# Patient Record
Sex: Male | Born: 1940
Health system: Southern US, Community
[De-identification: ages and names within clinical notes are randomized; demographics above are authoritative.]

## PROBLEM LIST (undated history)

## (undated) DIAGNOSIS — S8290XA Unspecified fracture of unspecified lower leg, initial encounter for closed fracture: Secondary | ICD-10-CM

## (undated) DIAGNOSIS — I639 Cerebral infarction, unspecified: Secondary | ICD-10-CM

## (undated) DIAGNOSIS — K219 Gastro-esophageal reflux disease without esophagitis: Secondary | ICD-10-CM

---

## 2001-02-09 ENCOUNTER — Encounter: Payer: Self-pay | Admitting: *Deleted

## 2001-02-09 ENCOUNTER — Ambulatory Visit (HOSPITAL_COMMUNITY): Admission: RE | Admit: 2001-02-09 | Discharge: 2001-02-09 | Payer: Self-pay | Admitting: *Deleted

## 2002-04-11 ENCOUNTER — Encounter: Payer: Self-pay | Admitting: Family Medicine

## 2002-04-11 ENCOUNTER — Ambulatory Visit (HOSPITAL_COMMUNITY): Admission: RE | Admit: 2002-04-11 | Discharge: 2002-04-11 | Payer: Self-pay | Admitting: Family Medicine

## 2006-05-22 ENCOUNTER — Emergency Department (HOSPITAL_COMMUNITY): Admission: EM | Admit: 2006-05-22 | Discharge: 2006-05-22 | Payer: Self-pay | Admitting: Emergency Medicine

## 2009-03-12 ENCOUNTER — Emergency Department (HOSPITAL_COMMUNITY): Admission: EM | Admit: 2009-03-12 | Discharge: 2009-03-12 | Payer: Self-pay | Admitting: Emergency Medicine

## 2010-01-01 ENCOUNTER — Emergency Department (HOSPITAL_COMMUNITY): Admission: EM | Admit: 2010-01-01 | Discharge: 2010-01-01 | Payer: Self-pay | Admitting: Emergency Medicine

## 2010-07-09 LAB — URINE MICROSCOPIC-ADD ON

## 2010-07-09 LAB — URINALYSIS, ROUTINE W REFLEX MICROSCOPIC
Bilirubin Urine: NEGATIVE
Glucose, UA: NEGATIVE mg/dL
Ketones, ur: 15 mg/dL — AB
Nitrite: NEGATIVE
Protein, ur: NEGATIVE mg/dL
Specific Gravity, Urine: 1.021 (ref 1.005–1.030)
Urobilinogen, UA: 0.2 mg/dL (ref 0.0–1.0)
pH: 5 (ref 5.0–8.0)

## 2010-07-09 LAB — URINE CULTURE
Colony Count: >=100000
Culture  Setup Time: 201109082119

## 2010-07-29 LAB — POCT I-STAT, CHEM 8
Calcium, Ion: 1.1 mmol/L — ABNORMAL LOW (ref 1.12–1.32)
Creatinine, Ser: 0.7 mg/dL (ref 0.4–1.5)
Hemoglobin: 15.3 g/dL (ref 13.0–17.0)
Sodium: 138 mEq/L (ref 135–145)
TCO2: 28 mmol/L (ref 0–100)

## 2011-04-18 ENCOUNTER — Encounter: Payer: Self-pay | Admitting: Emergency Medicine

## 2011-04-18 ENCOUNTER — Emergency Department (HOSPITAL_COMMUNITY): Payer: Medicare Other

## 2011-04-18 ENCOUNTER — Emergency Department (HOSPITAL_COMMUNITY)
Admission: EM | Admit: 2011-04-18 | Discharge: 2011-04-19 | Disposition: A | Discharge status: Home / Self Care | Payer: Medicare Other | Attending: Emergency Medicine | Admitting: Emergency Medicine

## 2011-04-18 DIAGNOSIS — R109 Unspecified abdominal pain: Secondary | ICD-10-CM | POA: Insufficient documentation

## 2011-04-18 DIAGNOSIS — M545 Low back pain, unspecified: Secondary | ICD-10-CM | POA: Insufficient documentation

## 2011-04-18 DIAGNOSIS — M533 Sacrococcygeal disorders, not elsewhere classified: Secondary | ICD-10-CM | POA: Insufficient documentation

## 2011-04-18 LAB — COMPREHENSIVE METABOLIC PANEL
ALT: 19 U/L (ref 0–53)
AST: 23 U/L (ref 0–37)
Albumin: 3.9 g/dL (ref 3.5–5.2)
CO2: 30 mEq/L (ref 19–32)
Calcium: 9.2 mg/dL (ref 8.4–10.5)
Sodium: 135 mEq/L (ref 135–145)
Total Protein: 7.1 g/dL (ref 6.0–8.3)

## 2011-04-18 LAB — CBC
MCH: 33.1 pg (ref 26.0–34.0)
MCHC: 34 g/dL (ref 30.0–36.0)
Platelets: 201 10*3/uL (ref 150–400)
RBC: 4.47 MIL/uL (ref 4.22–5.81)
RDW: 12.6 % (ref 11.5–15.5)

## 2011-04-18 LAB — URINALYSIS, ROUTINE W REFLEX MICROSCOPIC
Bilirubin Urine: NEGATIVE
Glucose, UA: NEGATIVE mg/dL
Hgb urine dipstick: NEGATIVE
Specific Gravity, Urine: 1.011 (ref 1.005–1.030)
pH: 6.5 (ref 5.0–8.0)

## 2011-04-18 MED ORDER — ALBUTEROL SULFATE (5 MG/ML) 0.5% IN NEBU
INHALATION_SOLUTION | RESPIRATORY_TRACT | Status: AC
  Filled 2011-04-18: qty 0.5

## 2011-04-18 NOTE — ED Notes (Signed)
Pt c/o right posterior back pain that started 2 days ago. Pt states thought he just had a pulled muscle. Pt states today pain has moved to RLQ. Pt thinks he is constipated. Pt states "i dont' go that much but i am going normally." pt difficult to follow with history of this illness. Pt denies n/v.

## 2011-04-18 NOTE — ED Notes (Signed)
Pt in CT.

## 2011-04-19 NOTE — ED Notes (Signed)
Patient is resting comfortably.pt awaiting discharge. No distress noted. Family at bedside. vss

## 2011-04-19 NOTE — ED Provider Notes (Addendum)
History     CSN: [id number]  Arrival date & time 04/18/11  2140   Chief Complaint  Patient presents with  . Abdominal Pain    started today. pt states thinks he is constipated.   . Muscle Pain    pt states began having right buttock pain yesterday. pt states "i thought i pulled a muscle"    HPI Pt was seen at 2330.  Per pt, c/o gradual onset and persistence of constant right sided low back/SI joint area "pain" x2 days.  Pain improves with rest, worsens with palpation of the area and movement/body position change.  Pt states the pain has been radiating into his right lower pelvis/groin today.  Pain as been waxing and waning.  Denies abd pain, no N/V/D, no fevers, no rash, no CP/SOB, no flank pain, no testicular pain/swelling, no dysuria/hematuria, no saddle anesthesia, no incont/retention of bowel or bladder, no tingling/numbness in extremities, no focal motor weakness.     History reviewed. No pertinent past medical history.  History reviewed. No pertinent past surgical history.   History  Substance Use Topics  . Smoking status: Never Smoker   . Smokeless tobacco: Not on file  . Alcohol Use: No    Review of Systems ROS: Statement: All systems negative except as marked or noted in the HPI; Constitutional: Negative for fever and chills. ; ; Eyes: Negative for eye pain, redness and discharge. ; ; ENMT: Negative for ear pain, hoarseness, nasal congestion, sinus pressure and sore throat. ; ; Cardiovascular: Negative for chest pain, palpitations, diaphoresis, dyspnea and peripheral edema. ; ; Respiratory: Negative for cough, wheezing and stridor. ; ; Gastrointestinal: +abd pain.  Negative for nausea, vomiting, diarrhea, blood in stool, hematemesis, jaundice and rectal bleeding. . ; ; Genitourinary: Negative for dysuria, flank pain and hematuria. Genital:  No penile drainage, no testicular pain or swelling, no scrotal swelling.; ; Musculoskeletal: +LBP.  Negative for neck pain. Negative for  swelling and trauma.; ; Skin: Negative for pruritus, rash, abrasions, blisters, bruising and skin lesion.; ; Neuro: Negative for headache, lightheadedness and neck stiffness. Negative for weakness, altered level of consciousness , altered mental status, extremity weakness, paresthesias, involuntary movement, seizure and syncope.     Allergies  Review of patient's allergies indicates no known allergies.  Home Medications   Current Outpatient Rx  Name Route Sig Dispense Refill  . VITAMIN C PO Oral Take 1 tablet by mouth daily.      Marland Kitchen VITAMIN D PO Oral Take 1 tablet by mouth daily.      Marland Kitchen VITAMIN B 12 PO Oral Take 1 tablet by mouth daily.      . OMEGA-3 FATTY ACIDS 1000 MG PO CAPS Oral Take 1 g by mouth daily.      Marland Kitchen VITAMIN A PO Oral Take 1 tablet by mouth daily.        BP 190/69  Pulse 61  Temp(Src) 98.4 F (36.9 C) (Oral)  Resp 18  SpO2 99%  Physical Exam 2335: Physical examination:  Nursing notes reviewed; Vital signs and O2 SAT reviewed;  Constitutional: Well developed, Well nourished, Well hydrated, In no acute distress; Head:  Normocephalic, atraumatic; Eyes: EOMI, PERRL, No scleral icterus; ENMT: Mouth and pharynx normal, Mucous membranes moist; Neck: Supple, Full range of motion, No lymphadenopathy; Cardiovascular: Regular rate and rhythm, No murmur, rub, or gallop; Respiratory: Breath sounds clear & equal bilaterally, No rales, rhonchi, wheezes, or rub, Normal respiratory effort/excursion; Chest: Nontender, Movement normal; Abdomen: Soft, Nontender, Nondistended, Normal  bowel sounds, No right inguinal tenderness or masses; Genitourinary: No CVA tenderness; Spine:  No midline CS, TS, LS tenderness, +TTP right lower lumbar paraspinal and SI joint areas.; Extremities: Pulses normal, No tenderness, No edema, No calf edema or asymmetry.; Neuro: AA&Ox3, Major CN grossly intact. No facial droop, speech clear, gait steady.  Strength 5/5 equal bilat UE's and LE's..; Skin: Color normal, Warm,  Dry, no rash.    ED Course  Procedures  MDM  MDM Reviewed: nursing note and vitals Interpretation: labs and CT scan   Results for orders placed during the hospital encounter of 04/18/11  CBC      Component Value Range   WBC 6.1  4.0 - 10.5 (K/uL)   RBC 4.47  4.22 - 5.81 (MIL/uL)   Hemoglobin 14.8  13.0 - 17.0 (g/dL)   HCT 16.1  09.6 - 04.5 (%)   MCV 97.3  78.0 - 100.0 (fL)   MCH 33.1  26.0 - 34.0 (pg)   MCHC 34.0  30.0 - 36.0 (g/dL)   RDW 40.9  81.1 - 91.4 (%)   Platelets 201  150 - 400 (K/uL)  COMPREHENSIVE METABOLIC PANEL      Component Value Range   Sodium 135  135 - 145 (mEq/L)   Potassium 3.9  3.5 - 5.1 (mEq/L)   Chloride 97  96 - 112 (mEq/L)   CO2 30  19 - 32 (mEq/L)   Glucose, Bld 107 (*) 70 - 99 (mg/dL)   BUN 20  6 - 23 (mg/dL)   Creatinine, Ser 7.82  0.50 - 1.35 (mg/dL)   Calcium 9.2  8.4 - 95.6 (mg/dL)   Total Protein 7.1  6.0 - 8.3 (g/dL)   Albumin 3.9  3.5 - 5.2 (g/dL)   AST 23  0 - 37 (U/L)   ALT 19  0 - 53 (U/L)   Alkaline Phosphatase 86  39 - 117 (U/L)   Total Bilirubin 0.3  0.3 - 1.2 (mg/dL)   GFR calc non Af Amer 88 (*) >90 (mL/min)   GFR calc Af Amer >90  >90 (mL/min)  URINALYSIS, ROUTINE W REFLEX MICROSCOPIC      Component Value Range   Color, Urine YELLOW  YELLOW    APPearance CLEAR  CLEAR    Specific Gravity, Urine 1.011  1.005 - 1.030    pH 6.5  5.0 - 8.0    Glucose, UA NEGATIVE  NEGATIVE (mg/dL)   Hgb urine dipstick NEGATIVE  NEGATIVE    Bilirubin Urine NEGATIVE  NEGATIVE    Ketones, ur NEGATIVE  NEGATIVE (mg/dL)   Protein, ur NEGATIVE  NEGATIVE (mg/dL)   Urobilinogen, UA 0.2  0.0 - 1.0 (mg/dL)   Nitrite NEGATIVE  NEGATIVE    Leukocytes, UA NEGATIVE  NEGATIVE    Ct Abdomen Pelvis Wo Contrast  04/19/2011  *RADIOLOGY REPORT*  Clinical Data: Right abdominal, flank, and back pain radiating to right lower quadrant  CT ABDOMEN AND PELVIS WITHOUT CONTRAST  Technique:  Multidetector CT imaging of the abdomen and pelvis was performed following  the standard protocol without intravenous contrast.   Sagittal and coronal MPR images reconstructed from axial data set.  Comparison: None  Findings:  Lung bases clear. Kidneys normal appearance for noncontrast study without hydronephrosis, urinary tract calcification, or ureteral dilatation. Bladder unremarkable. Mildly enlarged prostate gland 5.0 x 3.8 cm image 72. Within limits of a nonenhanced exam, no focal abnormalities of the liver, spleen, pancreas, or adrenal glands.  Diverticulosis of sigmoid colon without evidence of diverticulitis.  Stomach and bowel loops otherwise grossly normal appearance for technique. Scattered radiopacities/stool artifacts within right colon. Appendix not definitely visualized. No definite mass, adenopathy, free fluid or inflammatory process identified. Bones diffusely demineralized. Rotatory scoliosis of the thoracolumbar spine.  IMPRESSION: Enlarged prostate gland. Sigmoid diverticulosis. No acute intra abdominal or intrapelvic process identified on noncontrast CT.  Original Report Authenticated By: Lollie Marrow, M.D.      1:14 AM:  Pt re-examined with male chaperone.  Abd continues soft/NT to palp.  Genital exam performed with pt permission and male ED Tech chaparone present during exam.  No perineal erythema.  No penile lesions or drainage.  No scrotal edema or tenderness to palp.  Normal testicular lie.  No testicular tenderness to palp.  +cremasteric reflexes bilat.  No inguinal LAN or palpable masses.  +mild groin and scrotal erythema, no tenderness, no lesions, no soft tissue crepitus, no pruritis.  Does not appear cellulitic or [place] .  Pt states that "rash" has been present for the past several days, has been using OTC clotrimazole.  Informed pt to continue clotrimazole for at least the next 2 weeks, and 1 week after clinical improvement.  Verb understanding.  Pt does endorse "some dribbing" into his underwear after urination and often wears the same underwear  for several days; instructed to try to keep himself as clean/dry as possible, changing underwear more frequently if damp or soiled. Verb understanding.  Pt wants to go home now.  States he feels "fine."  Dx testing d/w pt and family.  Questions answered.  Verb understanding, agreeable to d/c home with outpt f/u.         Chima Astorino M   Laray Anger, DO 04/21/11 1937  Laray Anger, DO 04/21/11 1939

## 2011-04-20 LAB — URINE CULTURE: Culture  Setup Time: 201212232355

## 2011-05-11 DIAGNOSIS — K5900 Constipation, unspecified: Secondary | ICD-10-CM | POA: Diagnosis not present

## 2011-05-11 DIAGNOSIS — B356 Tinea cruris: Secondary | ICD-10-CM | POA: Diagnosis not present

## 2011-05-11 DIAGNOSIS — R079 Chest pain, unspecified: Secondary | ICD-10-CM | POA: Diagnosis not present

## 2011-05-11 DIAGNOSIS — K219 Gastro-esophageal reflux disease without esophagitis: Secondary | ICD-10-CM | POA: Diagnosis not present

## 2011-10-29 DIAGNOSIS — Z125 Encounter for screening for malignant neoplasm of prostate: Secondary | ICD-10-CM | POA: Diagnosis not present

## 2011-10-29 DIAGNOSIS — R5381 Other malaise: Secondary | ICD-10-CM | POA: Diagnosis not present

## 2012-11-15 DIAGNOSIS — H579 Unspecified disorder of eye and adnexa: Secondary | ICD-10-CM | POA: Diagnosis not present

## 2012-11-29 DIAGNOSIS — H43819 Vitreous degeneration, unspecified eye: Secondary | ICD-10-CM | POA: Diagnosis not present

## 2012-11-29 DIAGNOSIS — H2510 Age-related nuclear cataract, unspecified eye: Secondary | ICD-10-CM | POA: Diagnosis not present

## 2012-11-29 DIAGNOSIS — H524 Presbyopia: Secondary | ICD-10-CM | POA: Diagnosis not present

## 2012-11-29 DIAGNOSIS — H01009 Unspecified blepharitis unspecified eye, unspecified eyelid: Secondary | ICD-10-CM | POA: Diagnosis not present

## 2012-12-21 DIAGNOSIS — H01009 Unspecified blepharitis unspecified eye, unspecified eyelid: Secondary | ICD-10-CM | POA: Diagnosis not present

## 2012-12-21 DIAGNOSIS — H2510 Age-related nuclear cataract, unspecified eye: Secondary | ICD-10-CM | POA: Diagnosis not present

## 2014-04-18 ENCOUNTER — Encounter (HOSPITAL_COMMUNITY): Payer: Self-pay | Admitting: Emergency Medicine

## 2014-04-18 ENCOUNTER — Emergency Department (HOSPITAL_COMMUNITY): Payer: Medicare Other

## 2014-04-18 ENCOUNTER — Emergency Department (HOSPITAL_COMMUNITY)
Admission: EM | Admit: 2014-04-18 | Discharge: 2014-04-18 | Disposition: A | Discharge status: Home / Self Care | Payer: Medicare Other | Attending: Emergency Medicine | Admitting: Emergency Medicine

## 2014-04-18 DIAGNOSIS — Z79899 Other long term (current) drug therapy: Secondary | ICD-10-CM | POA: Insufficient documentation

## 2014-04-18 DIAGNOSIS — R05 Cough: Secondary | ICD-10-CM | POA: Diagnosis present

## 2014-04-18 DIAGNOSIS — J449 Chronic obstructive pulmonary disease, unspecified: Secondary | ICD-10-CM | POA: Diagnosis not present

## 2014-04-18 DIAGNOSIS — IMO0001 Reserved for inherently not codable concepts without codable children: Secondary | ICD-10-CM

## 2014-04-18 DIAGNOSIS — R0989 Other specified symptoms and signs involving the circulatory and respiratory systems: Secondary | ICD-10-CM | POA: Diagnosis not present

## 2014-04-18 DIAGNOSIS — R0602 Shortness of breath: Secondary | ICD-10-CM | POA: Diagnosis not present

## 2014-04-18 LAB — CBC
HCT: 38.1 % — ABNORMAL LOW (ref 39.0–52.0)
Hemoglobin: 13.1 g/dL (ref 13.0–17.0)
MCH: 33.2 pg (ref 26.0–34.0)
MCHC: 34.4 g/dL (ref 30.0–36.0)
MCV: 96.7 fL (ref 78.0–100.0)
PLATELETS: 225 10*3/uL (ref 150–400)
RBC: 3.94 MIL/uL — AB (ref 4.22–5.81)
RDW: 12.6 % (ref 11.5–15.5)
WBC: 10.4 10*3/uL (ref 4.0–10.5)

## 2014-04-18 LAB — BASIC METABOLIC PANEL
Anion gap: 7 (ref 5–15)
BUN: 20 mg/dL (ref 6–23)
CHLORIDE: 101 meq/L (ref 96–112)
CO2: 27 mmol/L (ref 19–32)
Calcium: 8.7 mg/dL (ref 8.4–10.5)
Creatinine, Ser: 1 mg/dL (ref 0.50–1.35)
GFR calc Af Amer: 85 mL/min — ABNORMAL LOW (ref >90)
GFR, EST NON AFRICAN AMERICAN: 74 mL/min — AB (ref >90)
GLUCOSE: 110 mg/dL — AB (ref 70–99)
POTASSIUM: 3.9 mmol/L (ref 3.5–5.1)
SODIUM: 135 mmol/L (ref 135–145)

## 2014-04-18 MED ORDER — ALBUTEROL SULFATE HFA 108 (90 BASE) MCG/ACT IN AERS
2.0000 | INHALATION_SPRAY | RESPIRATORY_TRACT | Status: DC
  Administered 2014-04-18: 2 via RESPIRATORY_TRACT
  Filled 2014-04-18: qty 6.7

## 2014-04-18 MED ORDER — PREDNISONE 20 MG PO TABS
60.0000 mg | ORAL_TABLET | Freq: Once | ORAL | Status: AC
  Administered 2014-04-18: 60 mg via ORAL
  Filled 2014-04-18: qty 3

## 2014-04-18 MED ORDER — PREDNISONE 20 MG PO TABS: 40.0000 mg | ORAL_TABLET | Freq: Every day | ORAL | Status: DC

## 2014-04-18 NOTE — ED Provider Notes (Signed)
CSN: [id number]     Arrival date & time 04/18/14  0510 History   First MD Initiated Contact with Patient 04/18/14 0557     Chief Complaint  Patient presents with  . Cough     (Consider location/radiation/quality/duration/timing/severity/associated sxs/prior Treatment) HPI Comments: Pt comes in with complaint of cough and sob for the last couple of days. Pt unsure of fever. Denies cp, nausea, vomiting, diarrhea. Pt is non smoker.He states that he has had a productive cough until last night. He states that last night his cough was was not productive which made him feel more sob. Denies any medical problems. Hasn't been taking anything for the symptoms. Denies being any more weak then normal.  The history is provided by the patient. No language interpreter was used.    History reviewed. No pertinent past medical history. History reviewed. No pertinent past surgical history. No family history on file. History  Substance Use Topics  . Smoking status: Never Smoker   . Smokeless tobacco: Not on file  . Alcohol Use: No    Review of Systems  All other systems reviewed and are negative.     Allergies  Review of patient's allergies indicates no known allergies.  Home Medications   Prior to Admission medications   Medication Sig Start Date End Date Taking? Authorizing Provider  Ascorbic Acid (VITAMIN C PO) Take 1 tablet by mouth daily.      Historical Provider, MD  Cholecalciferol (VITAMIN D PO) Take 1 tablet by mouth daily.      Historical Provider, MD  Cyanocobalamin (VITAMIN B 12 PO) Take 1 tablet by mouth daily.      Historical Provider, MD  fish oil-omega-3 fatty acids 1000 MG capsule Take 1 g by mouth daily.      Historical Provider, MD  VITAMIN A PO Take 1 tablet by mouth daily.      Historical Provider, MD   BP 171/84 mmHg  Pulse 65  Temp(Src) 99.3 F (37.4 C) (Oral)  Resp 16  Wt 134 lb (60.782 kg)  SpO2 98% Physical Exam  Constitutional: He is oriented to person,  place, and time. He appears well-developed and well-nourished.  HENT:  Head: Normocephalic.  Right Ear: External ear normal.  Left Ear: External ear normal.  Eyes: Conjunctivae and EOM are normal. Pupils are equal, round, and reactive to light.  Cardiovascular: Normal rate and regular rhythm.   Pulmonary/Chest: He has rales.  Abdominal: Soft. Bowel sounds are normal. There is no tenderness.  Musculoskeletal: Normal range of motion.  Neurological: He is alert and oriented to person, place, and time. Coordination normal.  Skin: Skin is dry.  Psychiatric: He has a normal mood and affect.  Nursing note and vitals reviewed.   ED Course  Procedures (including critical care time) Labs Review Labs Reviewed  BASIC METABOLIC PANEL - Abnormal; Notable for the following:    Glucose, Bld 110 (*)    GFR calc non Af Amer 74 (*)    GFR calc Af Amer 85 (*)    All other components within normal limits  CBC - Abnormal; Notable for the following:    RBC 3.94 (*)    HCT 38.1 (*)    All other components within normal limits    Imaging Review No results found.   EKG Interpretation   Date/Time:  Thursday April 18 2014 05:17:19 EST Ventricular Rate:  64 PR Interval:  176 QRS Duration: 94 QT Interval:  409 QTC Calculation: 422 R Axis:   -  18 Text Interpretation:  Sinus rhythm Consider left atrial enlargement  Borderline left axis deviation Confirmed by OTTER  MD, OLGA ([id number]) on  04/18/2014 5:46:41 AM      MDM   Final diagnoses:  COPD bronchitis    Pt in no distress. Vital stable. Will treat with steroids and inhaler to help with symptoms. Roe CoombsDon' t think antibiotics are needed at this time    Teressa LowerVrinda Orlin Kann, NP 04/18/14 [id number]  Olivia Mackielga M Otter, MD 04/18/14 1245

## 2014-04-18 NOTE — ED Notes (Signed)
Pt arrives with cough and SHOB for the last few days, states he hasn't been able to cough up anything tonight. Wife and child sick as well. Low grade fever

## 2014-04-30 DIAGNOSIS — J9801 Acute bronchospasm: Secondary | ICD-10-CM | POA: Diagnosis not present

## 2014-06-26 DIAGNOSIS — R51 Headache: Secondary | ICD-10-CM | POA: Diagnosis not present

## 2014-08-19 DIAGNOSIS — H6122 Impacted cerumen, left ear: Secondary | ICD-10-CM | POA: Diagnosis not present

## 2014-10-01 DIAGNOSIS — M79604 Pain in right leg: Secondary | ICD-10-CM | POA: Diagnosis not present

## 2014-10-01 DIAGNOSIS — K4090 Unilateral inguinal hernia, without obstruction or gangrene, not specified as recurrent: Secondary | ICD-10-CM | POA: Diagnosis not present

## 2014-10-01 DIAGNOSIS — E440 Moderate protein-calorie malnutrition: Secondary | ICD-10-CM | POA: Diagnosis not present

## 2014-10-25 DIAGNOSIS — K4090 Unilateral inguinal hernia, without obstruction or gangrene, not specified as recurrent: Secondary | ICD-10-CM | POA: Diagnosis not present

## 2014-11-11 DIAGNOSIS — H578 Other specified disorders of eye and adnexa: Secondary | ICD-10-CM | POA: Diagnosis not present

## 2015-07-04 DIAGNOSIS — R05 Cough: Secondary | ICD-10-CM | POA: Diagnosis not present

## 2015-07-12 ENCOUNTER — Emergency Department (HOSPITAL_COMMUNITY)
Admission: EM | Admit: 2015-07-12 | Discharge: 2015-07-12 | Disposition: A | Discharge status: Home / Self Care | Payer: Medicare Other | Attending: Emergency Medicine | Admitting: Emergency Medicine

## 2015-07-12 ENCOUNTER — Emergency Department (HOSPITAL_COMMUNITY): Payer: Medicare Other

## 2015-07-12 ENCOUNTER — Encounter (HOSPITAL_COMMUNITY): Payer: Self-pay

## 2015-07-12 DIAGNOSIS — Z79899 Other long term (current) drug therapy: Secondary | ICD-10-CM | POA: Diagnosis not present

## 2015-07-12 DIAGNOSIS — F4321 Adjustment disorder with depressed mood: Secondary | ICD-10-CM | POA: Diagnosis not present

## 2015-07-12 DIAGNOSIS — Z7952 Long term (current) use of systemic steroids: Secondary | ICD-10-CM | POA: Diagnosis not present

## 2015-07-12 DIAGNOSIS — R630 Anorexia: Secondary | ICD-10-CM | POA: Diagnosis not present

## 2015-07-12 DIAGNOSIS — F4320 Adjustment disorder, unspecified: Secondary | ICD-10-CM

## 2015-07-12 DIAGNOSIS — J40 Bronchitis, not specified as acute or chronic: Secondary | ICD-10-CM

## 2015-07-12 DIAGNOSIS — F329 Major depressive disorder, single episode, unspecified: Secondary | ICD-10-CM | POA: Diagnosis not present

## 2015-07-12 DIAGNOSIS — Z8719 Personal history of other diseases of the digestive system: Secondary | ICD-10-CM | POA: Insufficient documentation

## 2015-07-12 DIAGNOSIS — R05 Cough: Secondary | ICD-10-CM | POA: Diagnosis not present

## 2015-07-12 MED ORDER — ALBUTEROL SULFATE HFA 108 (90 BASE) MCG/ACT IN AERS
2.0000 | INHALATION_SPRAY | RESPIRATORY_TRACT | Status: DC | PRN
  Administered 2015-07-12: 2 via RESPIRATORY_TRACT
  Filled 2015-07-12: qty 6.7

## 2015-07-12 NOTE — ED Notes (Signed)
Pt reports his wife passed two weeks ago and since then he feels something is not right.  He feels lost, depressed and wants evaluated to make sure everything is ok.  Pt just finished antibiotics for recent cough, chest congestion.  Reports cough is not worsening.  NO respiratory distress.

## 2015-07-12 NOTE — Discharge Instructions (Signed)
If you were given medicines take as directed.  If you are on coumadin or contraceptives realize their levels and effectiveness is altered by many different medicines.  If you have any reaction (rash, tongues swelling, other) to the medicines stop taking and see a physician.    If your blood pressure was elevated in the ER make sure you follow up for management with a primary doctor or return for chest pain, shortness of breath or stroke symptoms.  Please follow up as directed and return to the ER or see a physician for new or worsening symptoms.  Thank you. Filed Vitals:   07/12/15 1628 07/12/15 1915 07/12/15 1930 07/12/15 2045  BP: 141/63   176/74  Pulse: 66 56 57 62  Temp: 98.8 F (37.1 C)     TempSrc: Oral     Resp: 14     Height: 5\' 10"  (1.778 m)     Weight: 118 lb 4 oz (53.638 kg)     SpO2: 98% 100% 100% 98%

## 2015-07-12 NOTE — ED Provider Notes (Signed)
CSN: [id number]     Arrival date & time 07/12/15  1600 History   First MD Initiated Contact with Patient 07/12/15 1814     Chief Complaint  Patient presents with  . Depression  . Cough     (Consider location/radiation/quality/duration/timing/severity/associated sxs/prior Treatment) HPI Comments: 75 year old male with hernia history presents with cough congestion for the past week. Patient recently finished azithromycin. Cough is improved congestion persistent. No fevers or chills. Decreased appetite. Patient also has had decreased appetite and decreased mood since the death of his wife 2 weeks ago. Patient has a good support system lives with 3 sons. Patient has a family doctors. Patient does not want to self-harm and does not want to see behavioral health at this time.  Patient is a 75 y.o. male presenting with depression and cough. The history is provided by the patient.  Depression Pertinent negatives include no chest pain, no abdominal pain, no headaches and no shortness of breath.  Cough Associated symptoms: no chest pain, no chills, no fever, no headaches, no rash and no shortness of breath     History reviewed. No pertinent past medical history. History reviewed. No pertinent past surgical history. History reviewed. No pertinent family history. Social History  Substance Use Topics  . Smoking status: Never Smoker   . Smokeless tobacco: None  . Alcohol Use: No    Review of Systems  Constitutional: Negative for fever and chills.  HENT: Positive for congestion.   Respiratory: Positive for cough. Negative for shortness of breath.   Cardiovascular: Negative for chest pain.  Gastrointestinal: Negative for vomiting and abdominal pain.  Genitourinary: Negative for dysuria and flank pain.  Musculoskeletal: Negative for back pain, neck pain and neck stiffness.  Skin: Negative for rash.  Neurological: Negative for light-headedness and headaches.  Psychiatric/Behavioral: Positive  for depression and dysphoric mood.      Allergies  Septra  Home Medications   Prior to Admission medications   Medication Sig Start Date End Date Taking? Authorizing Provider  b complex vitamins tablet Take 1 tablet by mouth daily.    Historical Provider, MD  Cholecalciferol (VITAMIN D PO) Take 1 tablet by mouth daily.      Historical Provider, MD  fish oil-omega-3 fatty acids 1000 MG capsule Take 1 g by mouth daily.      Historical Provider, MD  MAGNESIUM OXIDE PO Take 1 tablet by mouth daily.    Historical Provider, MD  predniSONE (DELTASONE) 20 MG tablet Take 2 tablets (40 mg total) by mouth daily. 04/18/14   Teressa Lower, NP  SELENIUM PO Take 1 tablet by mouth daily.    Historical Provider, MD  VITAMIN A PO Take 1 tablet by mouth daily.      Historical Provider, MD  vitamin C (ASCORBIC ACID) 500 MG tablet Take 1,500 mg by mouth daily.    Historical Provider, MD   BP 141/63 mmHg  Pulse 66  Temp(Src) 98.8 F (37.1 C) (Oral)  Resp 14  Ht  (1.778 m)  Wt 118 lb 4 oz (53.638 kg)  BMI 16.97 kg/m2  SpO2 98% Physical Exam  Constitutional: He is oriented to person, place, and time. He appears well-developed and well-nourished.  HENT:  Head: Normocephalic and atraumatic.  Eyes: Conjunctivae are normal. Right eye exhibits no discharge. Left eye exhibits no discharge.  Neck: Normal range of motion. Neck supple. No tracheal deviation present.  Cardiovascular: Normal rate and regular rhythm.   Pulmonary/Chest: Effort normal and breath sounds normal.  Abdominal:  Soft. He exhibits no distension. There is no tenderness. There is no guarding.  Musculoskeletal: He exhibits no edema.  Neurological: He is alert and oriented to person, place, and time.  Skin: Skin is warm. No rash noted.  Psychiatric:  Overall flat affect however patient occasionally will laugh. Clinically depressed/grieving.  Nursing note and vitals reviewed.   ED Course  Procedures (including critical care  time) Labs Review Labs Reviewed - No data to display  Imaging Review No results found. I have personally reviewed and evaluated these images and lab results as part of my medical decision-making. Dg Chest 2 View  07/12/2015  CLINICAL DATA:  Cough EXAM: CHEST  2 VIEW COMPARISON:  04/18/2014 FINDINGS: Cardiac shadow is within normal limits. Angulation of the sternum is noted likely related to prior fracture with healing. No other bony abnormality is seen. The lungs are clear bilaterally. IMPRESSION: No acute abnormality noted. Electronically Signed   By: Alcide CleverMark  Lukens M.D.   On: 07/12/2015 20:00     EKG Interpretation None      MDM   Final diagnoses:  Bronchitis  Anticipatory grieving   Clinically grieving/depressed patient presents with concern for bronchitis. Patient recently finished antibiotics mild improvement. Discussed screening chest x-ray at this age and he has not had a chest x-ray recently. Albuterol requested per patient to help with his symptoms. Patient is not a smoker. Patient has good support system outpatient.  Results and differential diagnosis were discussed with the patient/parent/guardian. Xrays were independently reviewed by myself.  Close follow up outpatient was discussed, comfortable with the plan.   Medications  albuterol (PROVENTIL HFA;VENTOLIN HFA) 108 (90 Base) MCG/ACT inhaler 2 puff (2 puffs Inhalation Given 07/12/15 1900)    Filed Vitals:   07/12/15 1628  BP: 141/63  Pulse: 66  Temp: 98.8 F (37.1 C)  TempSrc: Oral  Resp: 14  Height: 5\' 10"  (1.778 m)  Weight: 118 lb 4 oz (53.638 kg)  SpO2: 98%    Final diagnoses:  Bronchitis  Anticipatory grieving       Blane OharaJoshua Thermon Zulauf, MD 07/14/15 0040

## 2015-07-12 NOTE — ED Notes (Signed)
Patient transported to X-ray 

## 2015-07-16 DIAGNOSIS — E46 Unspecified protein-calorie malnutrition: Secondary | ICD-10-CM | POA: Diagnosis not present

## 2015-07-16 DIAGNOSIS — Z1389 Encounter for screening for other disorder: Secondary | ICD-10-CM | POA: Diagnosis not present

## 2015-07-16 DIAGNOSIS — F4320 Adjustment disorder, unspecified: Secondary | ICD-10-CM | POA: Diagnosis not present

## 2015-07-16 DIAGNOSIS — J069 Acute upper respiratory infection, unspecified: Secondary | ICD-10-CM | POA: Diagnosis not present

## 2015-08-07 DIAGNOSIS — F4320 Adjustment disorder, unspecified: Secondary | ICD-10-CM | POA: Diagnosis not present

## 2015-08-07 DIAGNOSIS — E46 Unspecified protein-calorie malnutrition: Secondary | ICD-10-CM | POA: Diagnosis not present

## 2015-08-07 DIAGNOSIS — G4700 Insomnia, unspecified: Secondary | ICD-10-CM | POA: Diagnosis not present

## 2015-08-07 DIAGNOSIS — M549 Dorsalgia, unspecified: Secondary | ICD-10-CM | POA: Diagnosis not present

## 2015-08-09 ENCOUNTER — Emergency Department (HOSPITAL_COMMUNITY)
Admission: EM | Admit: 2015-08-09 | Discharge: 2015-08-09 | Disposition: A | Discharge status: Home / Self Care | Payer: Medicare Other | Attending: Emergency Medicine | Admitting: Emergency Medicine

## 2015-08-09 ENCOUNTER — Emergency Department (HOSPITAL_COMMUNITY): Payer: Medicare Other

## 2015-08-09 ENCOUNTER — Encounter (HOSPITAL_COMMUNITY): Payer: Self-pay | Admitting: *Deleted

## 2015-08-09 DIAGNOSIS — R531 Weakness: Secondary | ICD-10-CM | POA: Diagnosis not present

## 2015-08-09 DIAGNOSIS — Z8719 Personal history of other diseases of the digestive system: Secondary | ICD-10-CM | POA: Diagnosis not present

## 2015-08-09 DIAGNOSIS — R51 Headache: Secondary | ICD-10-CM | POA: Diagnosis not present

## 2015-08-09 DIAGNOSIS — Z79899 Other long term (current) drug therapy: Secondary | ICD-10-CM | POA: Insufficient documentation

## 2015-08-09 DIAGNOSIS — F4320 Adjustment disorder, unspecified: Secondary | ICD-10-CM | POA: Insufficient documentation

## 2015-08-09 DIAGNOSIS — R0789 Other chest pain: Secondary | ICD-10-CM | POA: Diagnosis not present

## 2015-08-09 DIAGNOSIS — F4321 Adjustment disorder with depressed mood: Secondary | ICD-10-CM

## 2015-08-09 DIAGNOSIS — R079 Chest pain, unspecified: Secondary | ICD-10-CM | POA: Diagnosis present

## 2015-08-09 DIAGNOSIS — M549 Dorsalgia, unspecified: Secondary | ICD-10-CM | POA: Insufficient documentation

## 2015-08-09 HISTORY — DX: Gastro-esophageal reflux disease without esophagitis: K21.9

## 2015-08-09 LAB — CBC WITH DIFFERENTIAL/PLATELET
BASOS ABS: 0 10*3/uL (ref 0.0–0.1)
Basophils Relative: 0 %
EOS ABS: 0 10*3/uL (ref 0.0–0.7)
EOS PCT: 0 %
HCT: 45.2 % (ref 39.0–52.0)
Hemoglobin: 15.1 g/dL (ref 13.0–17.0)
LYMPHS ABS: 0.8 10*3/uL (ref 0.7–4.0)
LYMPHS PCT: 15 %
MCH: 32.3 pg (ref 26.0–34.0)
MCHC: 33.4 g/dL (ref 30.0–36.0)
MCV: 96.6 fL (ref 78.0–100.0)
MONO ABS: 0.5 10*3/uL (ref 0.1–1.0)
Monocytes Relative: 10 %
NEUTROS PCT: 75 %
Neutro Abs: 3.8 10*3/uL (ref 1.7–7.7)
Platelets: 249 10*3/uL (ref 150–400)
RBC: 4.68 MIL/uL (ref 4.22–5.81)
RDW: 13.4 % (ref 11.5–15.5)
WBC: 5.2 10*3/uL (ref 4.0–10.5)

## 2015-08-09 LAB — COMPREHENSIVE METABOLIC PANEL
ALBUMIN: 4.2 g/dL (ref 3.5–5.0)
ALK PHOS: 86 U/L (ref 38–126)
ALT: 22 U/L (ref 17–63)
AST: 26 U/L (ref 15–41)
Anion gap: 10 (ref 5–15)
BILIRUBIN TOTAL: 0.8 mg/dL (ref 0.3–1.2)
BUN: 18 mg/dL (ref 6–20)
CHLORIDE: 100 mmol/L — AB (ref 101–111)
CO2: 27 mmol/L (ref 22–32)
Calcium: 9.6 mg/dL (ref 8.9–10.3)
Creatinine, Ser: 0.94 mg/dL (ref 0.61–1.24)
GFR calc Af Amer: >60 mL/min (ref >60)
GFR calc non Af Amer: >60 mL/min (ref >60)
GLUCOSE: 101 mg/dL — AB (ref 65–99)
Potassium: 4.2 mmol/L (ref 3.5–5.1)
Sodium: 137 mmol/L (ref 135–145)
Total Protein: 7.2 g/dL (ref 6.5–8.1)

## 2015-08-09 LAB — RAPID URINE DRUG SCREEN, HOSP PERFORMED
AMPHETAMINES: NOT DETECTED
BARBITURATES: NOT DETECTED
BENZODIAZEPINES: NOT DETECTED
Cocaine: NOT DETECTED
Opiates: NOT DETECTED
TETRAHYDROCANNABINOL: NOT DETECTED

## 2015-08-09 LAB — I-STAT TROPONIN, ED: TROPONIN I, POC: 0 ng/mL (ref 0.00–0.08)

## 2015-08-09 LAB — URINALYSIS, ROUTINE W REFLEX MICROSCOPIC
Bilirubin Urine: NEGATIVE
GLUCOSE, UA: NEGATIVE mg/dL
HGB URINE DIPSTICK: NEGATIVE
Ketones, ur: NEGATIVE mg/dL
LEUKOCYTES UA: NEGATIVE
Nitrite: NEGATIVE
PH: 6.5 (ref 5.0–8.0)
PROTEIN: NEGATIVE mg/dL
SPECIFIC GRAVITY, URINE: 1.016 (ref 1.005–1.030)

## 2015-08-09 MED ORDER — SODIUM CHLORIDE 0.9 % IV BOLUS (SEPSIS)
1000.0000 mL | Freq: Once | INTRAVENOUS | Status: AC
  Administered 2015-08-09: 1000 mL via INTRAVENOUS

## 2015-08-09 NOTE — ED Notes (Signed)
Pt taken to CT.

## 2015-08-09 NOTE — ED Provider Notes (Signed)
CSN: [id number]     Arrival date & time 08/09/15  1143 History   First MD Initiated Contact with Patient 08/09/15 1216     Chief Complaint  Patient presents with  . Chest Pain     (Consider location/radiation/quality/duration/timing/severity/associated sxs/prior Treatment) HPI [name] is a 75 year old man history of bronchitis who presents today with complaints of weakness. He describes this as generalized weakness. It has been gradual in onset. He states he has been seen by his physician recently. He brings in a copy of an office visit from his doctor's office 2 days ago. At that time he had has an acute onset of inability to sleep and was diagnosed with insomnia. Also noted that he had back pain, breathing, and malnutrition. His wife died on February 17. He states that he was advised to call a counselor. He states that he talked them but they didn't do much for him. He denies head injury but states his head has been very full. He denies neck pain, chest pain, dyspnea, abdominal pain, nausea, vomiting, diarrhea, fever, or chills. He describes his typical day as watching television and spending some time with his sons. He states that he lives with his 3 sons. One of his sons dropped him off on his way to work at [employer]. He states that the other 2 sons do not have a car to get here. Past Medical History  Diagnosis Date  . Esophageal reflux    History reviewed. No pertinent past surgical history. No family history on file. Social History  Substance Use Topics  . Smoking status: Never Smoker   . Smokeless tobacco: None  . Alcohol Use: No    Review of Systems  All other systems reviewed and are negative.     Allergies  Review of patient's allergies indicates no active allergies.  Home Medications   Prior to Admission medications   Medication Sig Start Date End Date Taking? Authorizing Provider  Cholecalciferol (VITAMIN D PO) Take 1 tablet by mouth daily as needed (for  additional supplementation).     Historical Provider, MD  fish oil-omega-3 fatty acids 1000 MG capsule Take 1 g by mouth daily.      Historical Provider, MD  MAGNESIUM OXIDE PO Take 250 mg by mouth daily.     Historical Provider, MD  Multiple Vitamin (MULTIVITAMIN) tablet Take 1 tablet by mouth daily.    Historical Provider, MD  predniSONE (DELTASONE) 20 MG tablet Take 2 tablets (40 mg total) by mouth daily. Patient not taking: Reported on 07/12/2015 04/18/14   Teressa LowerVrinda Pickering, NP  Propylene Glycol (SYSTANE BALANCE) 0.6 % SOLN Place 1 drop into the right eye daily as needed.    Historical Provider, MD  vitamin C (ASCORBIC ACID) 500 MG tablet Take 500 mg by mouth daily.     Historical Provider, MD   BP 131/82 mmHg  Pulse 61  Temp(Src) 97.9 F (36.6 C) (Oral)  Resp 18  SpO2 97% Physical Exam  Constitutional: He is oriented to person, place, and time. He appears well-developed and well-nourished.  HENT:  Head: Normocephalic and atraumatic.  Right Ear: External ear normal.  Left Ear: External ear normal.  Nose: Nose normal.  Mouth/Throat: Oropharynx is clear and moist.  Eyes: Conjunctivae and EOM are normal. Pupils are equal, round, and reactive to light.  Neck: Normal range of motion. Neck supple.  Cardiovascular: Normal rate, regular rhythm, normal heart sounds and intact distal pulses.   Pulmonary/Chest: Effort normal and breath sounds normal. No  respiratory distress. He has no wheezes. He exhibits no tenderness.  Abdominal: Soft. Bowel sounds are normal. He exhibits no distension and no mass. There is no tenderness. There is no guarding.  Musculoskeletal: Normal range of motion.  Neurological: He is alert and oriented to person, place, and time. He has normal reflexes. He exhibits normal muscle tone. Coordination normal.  Skin: Skin is warm and dry.  Psychiatric: His behavior is normal. Judgment and thought content normal. His affect is blunt.  Nursing note and vitals  reviewed.   ED Course  Procedures (including critical care time) Labs Review Labs Reviewed  COMPREHENSIVE METABOLIC PANEL - Abnormal; Notable for the following:    Chloride 100 (*)    Glucose, Bld 101 (*)    All other components within normal limits  URINALYSIS, ROUTINE W REFLEX MICROSCOPIC (NOT AT [facility]) - Abnormal; Notable for the following:    APPearance CLOUDY (*)    All other components within normal limits  CBC WITH DIFFERENTIAL/PLATELET  URINE RAPID DRUG SCREEN, HOSP PERFORMED  CBC  I-STAT TROPOININ, ED  I-STAT TROPOININ, ED    Imaging Review No results found. I have personally reviewed and evaluated these images and lab results as part of my medical decision-making.   EKG Interpretation   Date/Time:  Saturday August 09 2015 11:54:28 EDT Ventricular Rate:  72 PR Interval:  150 QRS Duration: 92 QT Interval:  376 QTC Calculation: 411 R Axis:   90 Text Interpretation:  Sinus rhythm with occasional Premature ventricular  complexes Rightward axis Borderline ECG Confirmed by Marlise Fahr MD, Duwayne Heck  ([id number]) on 08/09/2015 12:17:05 PM      MDM   Final diagnoses:  Weakness  Grief reaction    75 year old male complaining of generalized weakness which seems to be chiefly related to depression since his wife died. Physical exam here is normal with exception of hypertension.  He has a primary care doctor and has been followed closely by them. There was some reference to possible chest pain in the nurses notes and an EKG was performed. This does not show any evidence of acute ischemia. A troponin is pending, feel that he can be discharged if this is normal. Patient does seem sad but denies suicidal ideation, homicidal ideation, or psychosis and has follow-up in place.  Signed out to Dr.Wentz, may discharge if troponin negative.    Margarita Grizzle, MD 08/09/15 [phone]

## 2015-08-09 NOTE — ED Notes (Signed)
Pt states here with chest heaviness and head feels uncomfortable.  Pt reports weakness.

## 2015-08-09 NOTE — ED Notes (Signed)
Pt alert asking for a urinal  Just voided 15 minutes ago he has no complaints

## 2015-08-09 NOTE — Discharge Instructions (Signed)
Your blood pressure was high here.  Please have it rechecked with your doctor next week.   Weakness Weakness is a lack of strength. You may feel weak all over your body or just in one part of your body. Weakness can be serious. In some cases, you may need more medical tests. HOME CARE  Rest.  Eat a well-balanced diet.  Try to exercise every day.  Only take medicines as told by your doctor. GET HELP RIGHT AWAY IF:   You cannot do your normal daily activities.  You cannot walk up and down stairs, or you feel very tired when you do so.  You have shortness of breath or chest pain.  You have trouble moving parts of your body.  You have weakness in only one body part or on only one side of the body.  You have a fever.  You have trouble speaking or swallowing.  You cannot control when you pee (urinate) or poop (bowel movement).  You have black or bloody throw up (vomit) or poop.  Your weakness gets worse or spreads to other body parts.  You have new aches or pains. MAKE SURE YOU:   Understand these instructions.  Will watch your condition.  Will get help right away if you are not doing well or get worse.   This information is not intended to replace advice given to you by your health care provider. Make sure you discuss any questions you have with your health care provider.   Document Released: 03/25/2008 Document Revised: 10/12/2011 Document Reviewed: 06/11/2011 Elsevier Interactive Patient Education 2016 [employer]. Complicated Grieving Grief is a normal response to the death of someone close to you. Feelings of fear, anger, and guilt can affect almost everyone who loses a loved one. It is also common to have symptoms of depression while you are grieving. These include problems with sleep, loss of appetite, and lack of energy. They may last for weeks or months after a loss. Complicated grief is different from normal grief or depression. Normal grieving involves sadness  and feelings of loss, but these feelings are not constant. Complicated grief is a constant and severe type of grief. It interferes with your ability to function normally. It may last for several months to a year or longer. Complicated grief may require treatment from a mental health care provider. CAUSES  It is not known why some people continue to struggle with grief and others do not. You may be at higher risk for complicated grief if:  The death of your loved one was sudden or unexpected.  The death of your loved one was due to a violent event.  Your loved one committed suicide.  Your loved one was a child or a young person.  You were very close to or dependent on the loved one.  You have a history of depression. SIGNS AND SYMPTOMS Signs and symptoms of complicated grief may include:  Feeling disbelief or numbness.  Being unable to enjoy good memories of your loved one.  Needing to avoid anything that reminds you of your loved one.  Being unable to stop thinking about the death.  Feeling intense anger or guilt.  Feeling alone and hopeless.  Feeling that your life is meaningless and empty.  Losing the desire to live. DIAGNOSIS Your health care provider may diagnose complicated grief if:  You have constant symptoms of grief for 6-12 months or longer.  Your symptoms are interfering with your ability to live your life. Your  health care provider may want you to see a mental health care provider. Many symptoms of depression are similar to the symptoms of complicated grief. It is important to be evaluated for complicated grief along with other mental health conditions. TREATMENT  Talk therapy with a mental health provider is the most common treatment for complicated grief. During therapy, you will learn healthy ways to cope with the loss of your loved one. In some cases, your mental health care provider may also recommend antidepressant medicines. HOME CARE INSTRUCTIONS  Take  care of yourself.  Eat regular meals and maintain a healthy diet. Eat plenty of fruits, vegetables, and whole grains.  Try to get some exercise each day.  Keep regular hours for sleep. Try to get at least 8 hours of sleep each night.  Do not use drugs or alcohol to ease your symptoms.  Take medicines only as directed by your health care provider.  Spend time with friends and loved ones.  Consider joining a grief (bereavement) support group to help you deal with your loss.  Keep all follow-up visits as directed by your health care provider. This is important. SEEK MEDICAL CARE IF:  Your symptoms keep you from functioning normally.  Your symptoms do not get better with treatment. SEEK IMMEDIATE MEDICAL CARE IF:  You have serious thoughts of hurting yourself or someone else.  You have suicidal feelings.   This information is not intended to replace advice given to you by your health care provider. Make sure you discuss any questions you have with your health care provider.   Document Released: 04/12/2005 Document Revised: 01/01/2015 Document Reviewed: 09/20/2013 Elsevier Interactive Patient Education [employer]2016 Elsevier Inc.

## 2015-11-03 DIAGNOSIS — H578 Other specified disorders of eye and adnexa: Secondary | ICD-10-CM | POA: Diagnosis not present

## 2015-12-04 DIAGNOSIS — M25521 Pain in right elbow: Secondary | ICD-10-CM | POA: Diagnosis not present

## 2015-12-04 DIAGNOSIS — R001 Bradycardia, unspecified: Secondary | ICD-10-CM | POA: Diagnosis not present

## 2015-12-05 ENCOUNTER — Other Ambulatory Visit: Payer: Self-pay | Admitting: Family Medicine

## 2015-12-05 ENCOUNTER — Ambulatory Visit
Admission: RE | Admit: 2015-12-05 | Discharge: 2015-12-05 | Disposition: A | Discharge status: Home / Self Care | Payer: Medicare Other | Source: Ambulatory Visit | Attending: Family Medicine | Admitting: Family Medicine

## 2015-12-05 DIAGNOSIS — M25421 Effusion, right elbow: Secondary | ICD-10-CM | POA: Diagnosis not present

## 2015-12-05 DIAGNOSIS — M25521 Pain in right elbow: Secondary | ICD-10-CM

## 2016-03-24 ENCOUNTER — Emergency Department (HOSPITAL_COMMUNITY): Payer: Medicare Other

## 2016-03-24 ENCOUNTER — Encounter (HOSPITAL_COMMUNITY): Payer: Self-pay | Admitting: *Deleted

## 2016-03-24 ENCOUNTER — Emergency Department (HOSPITAL_COMMUNITY)
Admission: EM | Admit: 2016-03-24 | Discharge: 2016-03-24 | Disposition: A | Discharge status: Home / Self Care | Payer: Medicare Other | Attending: Emergency Medicine | Admitting: Emergency Medicine

## 2016-03-24 DIAGNOSIS — J439 Emphysema, unspecified: Secondary | ICD-10-CM | POA: Diagnosis not present

## 2016-03-24 DIAGNOSIS — R0602 Shortness of breath: Secondary | ICD-10-CM | POA: Diagnosis not present

## 2016-03-24 LAB — CBC
HEMATOCRIT: 43.1 % (ref 39.0–52.0)
Hemoglobin: 14.9 g/dL (ref 13.0–17.0)
MCH: 33.4 pg (ref 26.0–34.0)
MCHC: 34.6 g/dL (ref 30.0–36.0)
MCV: 96.6 fL (ref 78.0–100.0)
PLATELETS: 218 10*3/uL (ref 150–400)
RBC: 4.46 MIL/uL (ref 4.22–5.81)
RDW: 12.9 % (ref 11.5–15.5)
WBC: 4.8 10*3/uL (ref 4.0–10.5)

## 2016-03-24 LAB — BASIC METABOLIC PANEL
Anion gap: 9 (ref 5–15)
BUN: 27 mg/dL — ABNORMAL HIGH (ref 6–20)
CHLORIDE: 102 mmol/L (ref 101–111)
CO2: 27 mmol/L (ref 22–32)
Calcium: 9 mg/dL (ref 8.9–10.3)
Creatinine, Ser: 1.14 mg/dL (ref 0.61–1.24)
GFR calc non Af Amer: >60 mL/min (ref >60)
Glucose, Bld: 112 mg/dL — ABNORMAL HIGH (ref 65–99)
POTASSIUM: 3.9 mmol/L (ref 3.5–5.1)
SODIUM: 138 mmol/L (ref 135–145)

## 2016-03-24 LAB — I-STAT TROPONIN, ED: Troponin i, poc: 0 ng/mL (ref 0.00–0.08)

## 2016-03-24 MED ORDER — ALBUTEROL SULFATE (2.5 MG/3ML) 0.083% IN NEBU
5.0000 mg | INHALATION_SOLUTION | Freq: Once | RESPIRATORY_TRACT | Status: AC
  Administered 2016-03-24: 5 mg via RESPIRATORY_TRACT
  Filled 2016-03-24: qty 6

## 2016-03-24 MED ORDER — ALBUTEROL SULFATE HFA 108 (90 BASE) MCG/ACT IN AERS
2.0000 | INHALATION_SPRAY | Freq: Once | RESPIRATORY_TRACT | Status: AC
  Administered 2016-03-24: 2 via RESPIRATORY_TRACT
  Filled 2016-03-24: qty 6.7

## 2016-03-24 MED ORDER — PREDNISONE 20 MG PO TABS: 60.0000 mg | ORAL_TABLET | Freq: Every day | ORAL | 0 refills | Status: DC

## 2016-03-24 MED ORDER — PREDNISONE 20 MG PO TABS
60.0000 mg | ORAL_TABLET | Freq: Once | ORAL | Status: AC
  Administered 2016-03-24: 60 mg via ORAL
  Filled 2016-03-24: qty 3

## 2016-03-24 NOTE — ED Triage Notes (Signed)
Pt c/o shortness of breath onset yesterday. Denies cough or cp. Reports having congestion

## 2016-03-24 NOTE — ED Provider Notes (Signed)
By signing my name below, I, Avnee Patel, attest that this documentation has been prepared under the direction and in the presence of Layla MawKristen N Rogerio Boutelle, DO  Electronically Signed: Clovis PuAvnee Patel, ED Scribe. 03/24/16. 1:59 AM.  TIME SEEN: 1:54 AM  CHIEF COMPLAINT: Shortness of Breath   HPI:  David Scott is a 75 y.o. male, with a hx of COPD, who presents to the Emergency Department complaining of sudden onset SOB x yesterday. Pt denies wheezing that he is aware of, cough, fever, chest pain, a hx of HTN, HLD, DM and any other associated symptoms and modifying factors at this time. He was given an albuterol treatment in triage which completely alleviated his SOB. He states he has an empty inhaler at home. Pt is a non-smoker. States he developed the symptoms after second hand smoke exposure.   ROS: See HPI Constitutional: no fever  Eyes: no drainage  ENT: no runny nose   Cardiovascular:  no chest pain  Resp: positive SOB, no cough or wheezing.  GI: no vomiting GU: no dysuria Integumentary: no rash  Allergy: no hives  Musculoskeletal: no leg swelling  Neurological: no slurred speech ROS otherwise negative  PAST MEDICAL HISTORY/PAST SURGICAL HISTORY:  Past Medical History:  Diagnosis Date  . Esophageal reflux     MEDICATIONS:  Prior to Admission medications   Medication Sig Start Date End Date Taking? Authorizing Provider  Cholecalciferol (VITAMIN D PO) Take 1 tablet by mouth daily as needed (for additional supplementation).     Historical Provider, MD  fish oil-omega-3 fatty acids 1000 MG capsule Take 1 g by mouth daily.      Historical Provider, MD  MAGNESIUM OXIDE PO Take 250 mg by mouth daily.     Historical Provider, MD  Multiple Vitamin (MULTIVITAMIN) tablet Take 1 tablet by mouth daily.    Historical Provider, MD  Propylene Glycol (SYSTANE BALANCE) 0.6 % SOLN Place 1 drop into the right eye daily as needed.    Historical Provider, MD  vitamin C (ASCORBIC ACID) 500 MG tablet  Take 500 mg by mouth daily.     Historical Provider, MD    ALLERGIES:  No Known Allergies  SOCIAL HISTORY:  Social History  Substance Use Topics  . Smoking status: Never Smoker  . Smokeless tobacco: Never Used  . Alcohol use No    FAMILY HISTORY: No family history on file.  EXAM: BP 154/73   Pulse (!) 54   Temp 97.7 F (36.5 C)   Resp 18   SpO2 99%  CONSTITUTIONAL: Alert and oriented and responds appropriately to questions. Well-appearing; well-nourished, elderly HEAD: Normocephalic EYES: Conjunctivae clear, PERRL, EOMI ENT: normal nose; no rhinorrhea; moist mucous membranes NECK: Supple, no meningismus, no nuchal rigidity, no LAD  CARD: RRR; S1 and S2 appreciated; no murmurs, no clicks, no rubs, no gallops RESP: Normal chest excursion without splinting or tachypnea; breath sounds clear and equal bilaterally; no wheezes, no rhonchi, no rales, no hypoxia or respiratory distress, speaking full sentences ABD/GI: Normal bowel sounds; non-distended; soft, non-tender, no rebound, no guarding, no peritoneal signs, no hepatosplenomegaly BACK:  The back appears normal and is non-tender to palpation, there is no CVA tenderness EXT: Normal ROM in all joints; non-tender to palpation; no edema; normal capillary refill; no cyanosis, no calf tenderness or swelling    SKIN: Normal color for age and race; warm; no rash NEURO: Moves all extremities equally, sensation to light touch intact diffusely, cranial nerves II through XII intact, normal speech PSYCH:  The patient's mood and manner are appropriate. Grooming and personal hygiene are appropriate.  MEDICAL DECISION MAKING: Patient here shortness of breath yesterday. No fever, cough, chest pain. EKG shows no ischemic abnormality. Does not appear volume overloaded. Labs obtained in triage are unremarkable including negative troponin. Chest x-ray clear except for emphysematous changes. He reports feeling back to his baseline after albuterol  treatment in his lungs are completely clear with good aeration and no hypoxia. States his symptoms are completely resolved and he is ready for discharge. No increased work of breathing or respiratory distress. I doubt that this is his anginal equivalent. Doubt PE, dissection. I feel he can be discharged home with albuterol inhaler, steroid burst. Discussed with him return precautions. Will give outpatient PCP follow-up. He verbalizes understanding and is comfortable with this plan.    EKG Interpretation  Date/Time:  Wednesday March 24 2016 00:37:41 EST Ventricular Rate:  54 PR Interval:  158 QRS Duration: 82 QT Interval:  418 QTC Calculation: 396 R Axis:   -17 Text Interpretation:  Sinus bradycardia Possible Anterior infarct , age undetermined Abnormal ECG No significant change since last tracing Confirmed by Penni Penado,  DO, Dianne Whelchel [phone]([id number]) on 03/24/2016 1:35:38 AM        I personally performed the services described in this documentation, which was scribed in my presence. The recorded information has been reviewed and is accurate.     Layla MawKristen N Cleston Lautner, DO 03/24/16 [phone]

## 2016-03-24 NOTE — Discharge Instructions (Signed)
Please follow-up with your primary care physician. You may use your albuterol inhaler 2-4 puffs every 2-4 hours as needed for shortness of breath, wheezing. You may start your steroids the morning of 03/25/16.  If you develop chest pain, chest pressure, increasing shortness of breath, wheezing that does not improve with throbbing on inhaler, feel like he may pass out or you do pass out, sudden sweating, please return to the emergency department.    To find a primary care or specialty doctor please call [phone] or [phone] to access "[place] Find a Doctor Service."  You may also go on the [facility] website at [email or web address]  There are also multiple [facility], [facility], [facility] and [facility] practices throughout the [place] that are frequently accepting new patients. You may find a clinic that is close to your home and contact them.  [facility] -  [address] [place] [phone]   [facility] -  [address] [place] [phone]   [facility] [phone]  [place] [phone]

## 2016-03-31 DIAGNOSIS — Z0000 Encounter for general adult medical examination without abnormal findings: Secondary | ICD-10-CM | POA: Diagnosis not present

## 2016-03-31 DIAGNOSIS — E46 Unspecified protein-calorie malnutrition: Secondary | ICD-10-CM | POA: Diagnosis not present

## 2016-03-31 DIAGNOSIS — Z125 Encounter for screening for malignant neoplasm of prostate: Secondary | ICD-10-CM | POA: Diagnosis not present

## 2016-03-31 DIAGNOSIS — Z136 Encounter for screening for cardiovascular disorders: Secondary | ICD-10-CM | POA: Diagnosis not present

## 2016-03-31 DIAGNOSIS — N401 Enlarged prostate with lower urinary tract symptoms: Secondary | ICD-10-CM | POA: Diagnosis not present

## 2016-03-31 DIAGNOSIS — Z1211 Encounter for screening for malignant neoplasm of colon: Secondary | ICD-10-CM | POA: Diagnosis not present

## 2016-04-23 DIAGNOSIS — R972 Elevated prostate specific antigen [PSA]: Secondary | ICD-10-CM | POA: Diagnosis not present

## 2016-06-01 DIAGNOSIS — N401 Enlarged prostate with lower urinary tract symptoms: Secondary | ICD-10-CM | POA: Diagnosis not present

## 2016-07-09 DIAGNOSIS — Z1211 Encounter for screening for malignant neoplasm of colon: Secondary | ICD-10-CM | POA: Diagnosis not present

## 2016-08-09 DIAGNOSIS — J209 Acute bronchitis, unspecified: Secondary | ICD-10-CM | POA: Diagnosis not present

## 2016-08-11 DIAGNOSIS — J209 Acute bronchitis, unspecified: Secondary | ICD-10-CM | POA: Diagnosis not present

## 2016-08-11 DIAGNOSIS — N401 Enlarged prostate with lower urinary tract symptoms: Secondary | ICD-10-CM | POA: Diagnosis not present

## 2016-08-11 DIAGNOSIS — Z23 Encounter for immunization: Secondary | ICD-10-CM | POA: Diagnosis not present

## 2016-10-21 DIAGNOSIS — N401 Enlarged prostate with lower urinary tract symptoms: Secondary | ICD-10-CM | POA: Diagnosis not present

## 2016-10-21 DIAGNOSIS — J9801 Acute bronchospasm: Secondary | ICD-10-CM | POA: Diagnosis not present

## 2016-11-18 DIAGNOSIS — J45909 Unspecified asthma, uncomplicated: Secondary | ICD-10-CM | POA: Diagnosis not present

## 2016-11-18 DIAGNOSIS — H5712 Ocular pain, left eye: Secondary | ICD-10-CM | POA: Diagnosis not present

## 2016-12-02 DIAGNOSIS — Z681 Body mass index (BMI) 19 or less, adult: Secondary | ICD-10-CM | POA: Diagnosis not present

## 2016-12-02 DIAGNOSIS — R531 Weakness: Secondary | ICD-10-CM | POA: Diagnosis not present

## 2016-12-02 DIAGNOSIS — F329 Major depressive disorder, single episode, unspecified: Secondary | ICD-10-CM | POA: Diagnosis not present

## 2016-12-06 ENCOUNTER — Other Ambulatory Visit: Payer: Self-pay | Admitting: Family Medicine

## 2016-12-06 ENCOUNTER — Ambulatory Visit
Admission: RE | Admit: 2016-12-06 | Discharge: 2016-12-06 | Disposition: A | Discharge status: Home / Self Care | Payer: Medicare Other | Source: Ambulatory Visit | Attending: Family Medicine | Admitting: Family Medicine

## 2016-12-06 DIAGNOSIS — R531 Weakness: Secondary | ICD-10-CM | POA: Diagnosis not present

## 2016-12-18 ENCOUNTER — Encounter (HOSPITAL_COMMUNITY): Payer: Self-pay

## 2016-12-18 ENCOUNTER — Emergency Department (HOSPITAL_COMMUNITY): Payer: Medicare Other

## 2016-12-18 ENCOUNTER — Emergency Department (HOSPITAL_COMMUNITY)
Admission: EM | Admit: 2016-12-18 | Discharge: 2016-12-18 | Disposition: A | Discharge status: Home / Self Care | Payer: Medicare Other | Attending: Emergency Medicine | Admitting: Emergency Medicine

## 2016-12-18 DIAGNOSIS — I10 Essential (primary) hypertension: Secondary | ICD-10-CM | POA: Insufficient documentation

## 2016-12-18 DIAGNOSIS — R42 Dizziness and giddiness: Secondary | ICD-10-CM | POA: Insufficient documentation

## 2016-12-18 DIAGNOSIS — Z79899 Other long term (current) drug therapy: Secondary | ICD-10-CM | POA: Diagnosis not present

## 2016-12-18 LAB — BASIC METABOLIC PANEL
ANION GAP: 9 (ref 5–15)
BUN: 20 mg/dL (ref 6–20)
CO2: 26 mmol/L (ref 22–32)
CREATININE: 1.02 mg/dL (ref 0.61–1.24)
Calcium: 8.9 mg/dL (ref 8.9–10.3)
Chloride: 101 mmol/L (ref 101–111)
GFR calc Af Amer: >60 mL/min (ref >60)
GFR calc non Af Amer: >60 mL/min (ref >60)
GLUCOSE: 159 mg/dL — AB (ref 65–99)
Potassium: 4.2 mmol/L (ref 3.5–5.1)
Sodium: 136 mmol/L (ref 135–145)

## 2016-12-18 LAB — CBC
HCT: 42.1 % (ref 39.0–52.0)
Hemoglobin: 14.4 g/dL (ref 13.0–17.0)
MCH: 32.8 pg (ref 26.0–34.0)
MCHC: 34.2 g/dL (ref 30.0–36.0)
MCV: 95.9 fL (ref 78.0–100.0)
Platelets: 206 10*3/uL (ref 150–400)
RBC: 4.39 MIL/uL (ref 4.22–5.81)
RDW: 12.7 % (ref 11.5–15.5)
WBC: 5.6 10*3/uL (ref 4.0–10.5)

## 2016-12-18 LAB — URINALYSIS, ROUTINE W REFLEX MICROSCOPIC
Bilirubin Urine: NEGATIVE
GLUCOSE, UA: NEGATIVE mg/dL
HGB URINE DIPSTICK: NEGATIVE
KETONES UR: NEGATIVE mg/dL
LEUKOCYTES UA: NEGATIVE
Nitrite: NEGATIVE
PH: 5 (ref 5.0–8.0)
PROTEIN: NEGATIVE mg/dL
Specific Gravity, Urine: 1.016 (ref 1.005–1.030)

## 2016-12-18 LAB — CBG MONITORING, ED: Glucose-Capillary: 140 mg/dL — ABNORMAL HIGH (ref 65–99)

## 2016-12-18 MED ORDER — LISINOPRIL 10 MG PO TABS
10.0000 mg | ORAL_TABLET | Freq: Once | ORAL | Status: AC
  Administered 2016-12-18: 10 mg via ORAL
  Filled 2016-12-18: qty 1

## 2016-12-18 MED ORDER — LISINOPRIL 10 MG PO TABS: 10.0000 mg | ORAL_TABLET | Freq: Every day | ORAL | 0 refills | Status: DC

## 2016-12-18 NOTE — ED Provider Notes (Signed)
Medical screening examination/treatment/procedure(s) were conducted as a shared visit with non-physician practitioner(s) and myself.  I personally evaluated the patient during the encounter.  Clinical Impression:   Final diagnoses:  Dizzy  Hypertension, unspecified type    The patient is a 76 year old male, no significant prior medical history other than asthma as a child. He still uses the occasional inhaler. He does not smoke and takes no medications. He follows up with Dr. Fulton Mole as his family doctor. The patient reports that earlier today he had an episode of lightheadedness when he was trying to walk from one room to another. This was a feeling like he was going to pass out, it was not vertigo, it was short-lived and resolved very quickly and spontaneously. There was no associated chest pain, shortness of breath or palpitations.  On exam the patient has no peripheral edema, soft nontender abdomen, clear heart and lung sounds, no wheezing, speaks in full sentences, oropharynx is clear and moist, normal sensation and strength in all 4 extremities. Absence of the left thumb congenitally but otherwise normal-appearing extremities without compartment tenderness. Joints are diffusely supple. There is no cogwheel rigidity.  The patient is otherwise well-appearing, EKG and labs were reviewed in no acute findings were seen.  The patient does have hypertension, we'll start lisinopril, patient given indications for return, indications for follow-up in the outpatient setting within 1-2 weeks. Lisinopril side effects also explained to the patient.  The patient is stable for discharge and expressed his understanding to the indications for return.   EKG Interpretation  Date/Time:  Saturday December 18 2016 16:32:44 EDT Ventricular Rate:  64 PR Interval:  166 QRS Duration: 90 QT Interval:  388 QTC Calculation: 400 R Axis:   -74 Text Interpretation:  Normal sinus rhythm Left anterior fascicular  block Inferior infarct , age undetermined Possible Anterior infarct , age undetermined Abnormal ECG since last tracing no significant change Confirmed by Eber Hong ([id number]) on 12/18/2016 8:09:16 PM         Eber Hong, MD 12/19/16 [phone]

## 2016-12-18 NOTE — ED Triage Notes (Addendum)
PT reports dizzy spell today while standing up in bathroom. Pt David Scott like he was going to pass out so he sat down. PT reports he is no dizzy at this time but when ever he gets up to move he feels dizzy.   PT also reports feeling like he has congestion in his chest and has been having to use albuterol inhaler more frequently.

## 2016-12-18 NOTE — ED Notes (Signed)
Checked on patient and updated on delays and wait time

## 2016-12-18 NOTE — Discharge Instructions (Signed)
Continue to keep hydrated at home.   Your blood pressure was high today, please take blood pressure medication called Lisinopril once a day and follow up at your primary care office in one week to recheck blood pressure and manage your medication.   Please return to the ER if you get dizzy again and lose consciousness or have chest pain associated with dizziness

## 2016-12-18 NOTE — ED Notes (Signed)
Patient Alert and oriented X4. Stable and ambulatory. Patient verbalized understanding of the discharge instructions.  Patient belongings were taken by the patient.  

## 2016-12-18 NOTE — ED Provider Notes (Signed)
[facility]-EMERGENCY DEPT Provider Note   CSN: [id number] Arrival date & time: 12/18/16  1620     History   Chief Complaint Chief Complaint  Patient presents with  . Dizziness  . Shortness of Breath    HPI David Scott is a 76 y.o. male.  HPI   [name] is a 76yo male with no significant past medical history who presents to the Emergency department for evaluation of a "dizzy spell." He states that at about 3PM this afternoon he stood up from the couch in his living room and walked to the nearby bathroom. Once he got to the bathroom he felt lightheaded and unsteady on his feet. He states that he immediately walked back to the couch and when he sat down felt better. Denies LOC, room spinning, chest pain, palpitations, visual disturbance, numbness, weakness. States that he has not had another episode of dizziness since that time. He is able to ambulate on his own.   Past Medical History:  Diagnosis Date  . Esophageal reflux     There are no active problems to display for this patient.   History reviewed. No pertinent surgical history.     Home Medications    Prior to Admission medications   Medication Sig Start Date End Date Taking? Authorizing Provider  Cholecalciferol (VITAMIN D PO) Take 1 tablet by mouth daily as needed (for additional supplementation).     [provider]  fish oil-omega-3 fatty acids 1000 MG capsule Take 1 g by mouth daily.      [provider]  lisinopril (PRINIVIL,ZESTRIL) 10 MG tablet Take 1 tablet (10 mg total) by mouth daily. 12/18/16   Kellie Shropshire, PA-C  MAGNESIUM OXIDE PO Take 250 mg by mouth daily.     [provider]  Multiple Vitamin (MULTIVITAMIN) tablet Take 1 tablet by mouth daily.    [provider]  predniSONE (DELTASONE) 20 MG tablet Take 3 tablets (60 mg total) by mouth daily. 03/24/16   Ward, Layla Maw, DO  Propylene Glycol (SYSTANE BALANCE) 0.6 % SOLN Place 1 drop into the right eye daily as  needed.    [provider]  vitamin C (ASCORBIC ACID) 500 MG tablet Take 500 mg by mouth daily.     [provider]    Family History No family history on file.  Social History Social History  Substance Use Topics  . Smoking status: Never Smoker  . Smokeless tobacco: Never Used  . Alcohol use No     Allergies   Patient has no known allergies.   Review of Systems Review of Systems  Constitutional: Negative for chills, fatigue and fever.  Eyes: Negative for pain.  Respiratory: Negative for cough, shortness of breath and wheezing.   Cardiovascular: Negative for chest pain and palpitations.  Gastrointestinal: Negative for abdominal pain, diarrhea, nausea and vomiting.  Genitourinary: Negative for dysuria.  Musculoskeletal: Negative for arthralgias.  Skin: Negative for wound.  Neurological: Positive for dizziness, tremors and light-headedness. Negative for weakness and numbness.  Psychiatric/Behavioral: Negative for agitation.     Physical Exam Updated Vital Signs BP (!) 209/63   Pulse (!) 54   Temp 98 F (36.7 C) (Oral)   Resp 16   SpO2 100%   Physical Exam  Constitutional: He appears well-developed and well-nourished. No distress.  HENT:  Head: Normocephalic and atraumatic.  Mouth/Throat: Oropharynx is clear and moist.  Eyes: Pupils are equal, round, and reactive to light. EOM are normal. Right eye exhibits no  discharge. Left eye exhibits no discharge.  Cardiovascular: Normal rate, regular rhythm and intact distal pulses.  Exam reveals no gallop and no friction rub.   No murmur heard. Pulmonary/Chest: Effort normal. No respiratory distress. He has no wheezes. He has no rales.  Abdominal: Soft. Bowel sounds are normal. There is no tenderness.  Neurological: He is alert. He has normal strength. No cranial nerve deficit or sensory deficit. Coordination normal.  Resting tremor noted in the bilateral upper extremities  Skin: Skin is warm and dry. He  is not diaphoretic.  Psychiatric: He has a normal mood and affect. His behavior is normal.  Speech somewhat slow.   Nursing note and vitals reviewed.    ED Treatments / Results  Labs (all labs ordered are listed, but only abnormal results are displayed) Labs Reviewed  BASIC METABOLIC PANEL - Abnormal; Notable for the following:       Result Value   Glucose, Bld 159 (*)    All other components within normal limits  URINALYSIS, ROUTINE W REFLEX MICROSCOPIC - Abnormal; Notable for the following:    Color, Urine AMBER (*)    APPearance HAZY (*)    All other components within normal limits  CBG MONITORING, ED - Abnormal; Notable for the following:    Glucose-Capillary 140 (*)    All other components within normal limits  CBC    EKG  EKG Interpretation  Date/Time:  Saturday December 18 2016 16:32:44 EDT Ventricular Rate:  64 PR Interval:  166 QRS Duration: 90 QT Interval:  388 QTC Calculation: 400 R Axis:   -74 Text Interpretation:  Normal sinus rhythm Left anterior fascicular block Inferior infarct , age undetermined Possible Anterior infarct , age undetermined Abnormal ECG since last tracing no significant change Confirmed by Eber Hong ([id number]) on 12/18/2016 8:09:16 PM       Radiology Dg Chest 2 View  Result Date: 12/18/2016 CLINICAL DATA:  Dizziness EXAM: CHEST  2 VIEW COMPARISON:  12/06/2016 FINDINGS: Cardiac shadow is within normal limits. The lungs are hyperinflated without focal infiltrate or sizable effusion. Degenerative changes of the thoracic spine are noted. IMPRESSION: No active cardiopulmonary disease. Electronically Signed   By: Alcide Clever M.D.   On: 12/18/2016 17:01    Procedures Procedures (including critical care time)  Medications Ordered in ED Medications  lisinopril (PRINIVIL,ZESTRIL) tablet 10 mg (10 mg Oral Given 12/18/16 2213)     Initial Impression / Assessment and Plan / ED Course  I have reviewed the triage vital signs and the nursing  notes.  Pertinent labs & imaging results that were available during my care of the patient were reviewed by me and considered in my medical decision making (see chart for details).   Patient with an episode of "dizziness" this afternoon. Likely isolated and related to positional vertigo. Doubt that this is a stroke, as patient does not have any focal neurological deficit on exam. Will not get imaging at this time. Chest Xray negative for cardiopulmonary disease. Doubt that this is ACS considering patient does not have chest pain and EKG shows no ischemic changes. Likely not related to hypoglycemia as patients CBG 140. UA is not concerning for infection. No anemia found on CBC.   Patient is hypertensive in the Emergency Department, up to 209/63. Do not think that this is a Hypertensive Emergency as patient denies chest pain, headache, vision changes, urinary changes. Furthermore his kidney function is good (Creatinine 1.02). Given lisinopril in the ER and counseled to get a blood  pressure check at his primary care office in a week. Patient counseled on potential angioedema and cough with this medication. Patient discussed with Dr. Hyacinth Meeker who agrees to the plan. Patient agrees and voices understanding.   Final Clinical Impressions(s) / ED Diagnoses   Final diagnoses:  Dizzy  Hypertension, unspecified type    New Prescriptions New Prescriptions   LISINOPRIL (PRINIVIL,ZESTRIL) 10 MG TABLET    Take 1 tablet (10 mg total) by mouth daily.     Kellie Shropshire, PA-C 12/18/16 2249    Eber Hong, MD 12/19/16 [phone]

## 2016-12-28 DIAGNOSIS — I10 Essential (primary) hypertension: Secondary | ICD-10-CM | POA: Diagnosis not present

## 2016-12-28 DIAGNOSIS — R42 Dizziness and giddiness: Secondary | ICD-10-CM | POA: Diagnosis not present

## 2016-12-28 DIAGNOSIS — J45909 Unspecified asthma, uncomplicated: Secondary | ICD-10-CM | POA: Diagnosis not present

## 2016-12-28 DIAGNOSIS — R351 Nocturia: Secondary | ICD-10-CM | POA: Diagnosis not present

## 2017-01-14 DIAGNOSIS — R234 Changes in skin texture: Secondary | ICD-10-CM | POA: Diagnosis not present

## 2017-01-14 DIAGNOSIS — R42 Dizziness and giddiness: Secondary | ICD-10-CM | POA: Diagnosis not present

## 2017-01-14 DIAGNOSIS — F329 Major depressive disorder, single episode, unspecified: Secondary | ICD-10-CM | POA: Diagnosis not present

## 2017-01-14 DIAGNOSIS — E46 Unspecified protein-calorie malnutrition: Secondary | ICD-10-CM | POA: Diagnosis not present

## 2017-01-14 DIAGNOSIS — I10 Essential (primary) hypertension: Secondary | ICD-10-CM | POA: Diagnosis not present

## 2017-01-14 DIAGNOSIS — R718 Other abnormality of red blood cells: Secondary | ICD-10-CM | POA: Diagnosis not present

## 2017-01-28 ENCOUNTER — Emergency Department (HOSPITAL_COMMUNITY)
Admission: EM | Admit: 2017-01-28 | Discharge: 2017-01-28 | Disposition: A | Discharge status: Home / Self Care | Payer: Medicare Other | Attending: Emergency Medicine | Admitting: Emergency Medicine

## 2017-01-28 ENCOUNTER — Encounter (HOSPITAL_COMMUNITY): Payer: Self-pay | Admitting: Emergency Medicine

## 2017-01-28 ENCOUNTER — Emergency Department (HOSPITAL_COMMUNITY): Payer: Medicare Other

## 2017-01-28 DIAGNOSIS — I10 Essential (primary) hypertension: Secondary | ICD-10-CM | POA: Diagnosis not present

## 2017-01-28 DIAGNOSIS — R4020 Unspecified coma: Secondary | ICD-10-CM | POA: Diagnosis not present

## 2017-01-28 DIAGNOSIS — Z79899 Other long term (current) drug therapy: Secondary | ICD-10-CM | POA: Insufficient documentation

## 2017-01-28 DIAGNOSIS — R413 Other amnesia: Secondary | ICD-10-CM | POA: Diagnosis not present

## 2017-01-28 DIAGNOSIS — R410 Disorientation, unspecified: Secondary | ICD-10-CM | POA: Diagnosis present

## 2017-01-28 LAB — COMPREHENSIVE METABOLIC PANEL
ALT: 20 U/L (ref 17–63)
AST: 25 U/L (ref 15–41)
Albumin: 4.3 g/dL (ref 3.5–5.0)
Alkaline Phosphatase: 75 U/L (ref 38–126)
Anion gap: 8 (ref 5–15)
BUN: 22 mg/dL — AB (ref 6–20)
CHLORIDE: 100 mmol/L — AB (ref 101–111)
CO2: 27 mmol/L (ref 22–32)
CREATININE: 1.05 mg/dL (ref 0.61–1.24)
Calcium: 9.1 mg/dL (ref 8.9–10.3)
GFR calc Af Amer: >60 mL/min (ref >60)
GFR calc non Af Amer: >60 mL/min (ref >60)
Glucose, Bld: 103 mg/dL — ABNORMAL HIGH (ref 65–99)
Potassium: 4.4 mmol/L (ref 3.5–5.1)
Sodium: 135 mmol/L (ref 135–145)
Total Bilirubin: 0.8 mg/dL (ref 0.3–1.2)
Total Protein: 7 g/dL (ref 6.5–8.1)

## 2017-01-28 LAB — CBC
HCT: 44 % (ref 39.0–52.0)
Hemoglobin: 15 g/dL (ref 13.0–17.0)
MCH: 32.9 pg (ref 26.0–34.0)
MCHC: 34.1 g/dL (ref 30.0–36.0)
MCV: 96.5 fL (ref 78.0–100.0)
PLATELETS: 214 10*3/uL (ref 150–400)
RBC: 4.56 MIL/uL (ref 4.22–5.81)
RDW: 12.9 % (ref 11.5–15.5)
WBC: 6.6 10*3/uL (ref 4.0–10.5)

## 2017-01-28 LAB — URINALYSIS, ROUTINE W REFLEX MICROSCOPIC
Bilirubin Urine: NEGATIVE
Glucose, UA: NEGATIVE mg/dL
Hgb urine dipstick: NEGATIVE
Ketones, ur: 5 mg/dL — AB
LEUKOCYTES UA: NEGATIVE
Nitrite: NEGATIVE
PROTEIN: NEGATIVE mg/dL
Specific Gravity, Urine: 1.011 (ref 1.005–1.030)
pH: 6 (ref 5.0–8.0)

## 2017-01-28 NOTE — ED Provider Notes (Signed)
[facility] Provider Note   CSN: [id number] Arrival date & time: 01/28/17  1635     History   Chief Complaint Chief Complaint  Patient presents with  . Altered Mental Status    HPI David Scott is a 76 y.o. male.  Patient c/o feeling confused/disoriented this AM.  States he felt fine when he went to bed yesterday. This AM, awoke, and felt he forgot where some things were in his house. Denies prior similar symptoms. States after a few hours, he was able to remember where the things were located. Currently feels fine, denies any c/o. Patient denies hx cva. Denies any change in meds, states he previously has stopped taking everything other than his ventolin. Denies headache. No chest pain or sob. No cough or uri c/o. No abd pain. No nvd. Denies recent wt change. No recent trauma or fall. Denies any change in speech or vision. No numbness/weakness or change in normal functional ability.    The history is provided by the patient.  Altered Mental Status   Pertinent negatives include no confusion and no weakness.    Past Medical History:  Diagnosis Date  . Esophageal reflux     There are no active problems to display for this patient.   History reviewed. No pertinent surgical history.     Home Medications    Prior to Admission medications   Medication Sig Start Date End Date Taking? Authorizing Provider  Cholecalciferol (VITAMIN D PO) Take 1 tablet by mouth daily as needed (for additional supplementation).     [provider]  fish oil-omega-3 fatty acids 1000 MG capsule Take 1 g by mouth daily.      [provider]  lisinopril (PRINIVIL,ZESTRIL) 10 MG tablet Take 1 tablet (10 mg total) by mouth daily. 12/18/16   Kellie Shropshire, PA-C  MAGNESIUM OXIDE PO Take 250 mg by mouth daily.     [provider]  Multiple Vitamin (MULTIVITAMIN) tablet Take 1 tablet by mouth daily.    [provider]  predniSONE (DELTASONE) 20 MG tablet  Take 3 tablets (60 mg total) by mouth daily. 03/24/16   Ward, Layla Maw, DO  Propylene Glycol (SYSTANE BALANCE) 0.6 % SOLN Place 1 drop into the right eye daily as needed.    [provider]  vitamin C (ASCORBIC ACID) 500 MG tablet Take 500 mg by mouth daily.     [provider]    Family History No family history on file.  Social History Social History  Substance Use Topics  . Smoking status: Never Smoker  . Smokeless tobacco: Never Used  . Alcohol use No     Allergies   Patient has no known allergies.   Review of Systems Review of Systems  Constitutional: Negative for chills and fever.  HENT: Negative for trouble swallowing.   Eyes: Negative for visual disturbance.  Respiratory: Negative for cough and shortness of breath.   Cardiovascular: Negative for chest pain.  Gastrointestinal: Negative for abdominal pain and vomiting.  Genitourinary: Negative for flank pain.  Musculoskeletal: Negative for back pain and neck pain.  Skin: Negative for rash.  Neurological: Negative for speech difficulty, weakness, numbness and headaches.  Hematological: Does not bruise/bleed easily.  Psychiatric/Behavioral: Negative for confusion.     Physical Exam Updated Vital Signs BP 135/69 (BP Location: Left Arm)   Pulse (!) 54   Temp 98.1 F (36.7 C) (Oral)   Resp 15   Ht 1.778 m ( )   SpO2  97%   Physical Exam  Constitutional: He appears well-developed and well-nourished. No distress.  HENT:  Head: Atraumatic.  Mouth/Throat: Oropharynx is clear and moist.  Eyes: Conjunctivae are normal.  Neck: Neck supple. No tracheal deviation present. No thyromegaly present.  No bruits.   Cardiovascular: Normal rate, regular rhythm, normal heart sounds and intact distal pulses.   Pulmonary/Chest: Effort normal and breath sounds normal. No accessory muscle usage. No respiratory distress.  Abdominal: Soft. Bowel sounds are normal. He exhibits no distension. There is no  tenderness.  Genitourinary:  Genitourinary Comments: No cva tenderness  Musculoskeletal: He exhibits no edema.  Neurological: He is alert.  Speech clear/fluent, although mildly slow to respond. No pronator drift. Motor intact bil, stre 5/5. sens grossly intact. Steady gait.   Skin: Skin is warm and dry. He is not diaphoretic.  Psychiatric: He has a normal mood and affect.  Nursing note and vitals reviewed.    ED Treatments / Results  Labs (all labs ordered are listed, but only abnormal results are displayed) Results for orders placed or performed during the hospital encounter of 01/28/17  CBC  Result Value Ref Range   WBC 6.6 4.0 - 10.5 K/uL   RBC 4.56 4.22 - 5.81 MIL/uL   Hemoglobin 15.0 13.0 - 17.0 g/dL   HCT 16.1 09.6 - 04.5 %   MCV 96.5 78.0 - 100.0 fL   MCH 32.9 26.0 - 34.0 pg   MCHC 34.1 30.0 - 36.0 g/dL   RDW 40.9 81.1 - 91.4 %   Platelets 214 150 - 400 K/uL  Urinalysis, Routine w reflex microscopic  Result Value Ref Range   Color, Urine YELLOW YELLOW   APPearance CLEAR CLEAR   Specific Gravity, Urine 1.011 1.005 - 1.030   pH 6.0 5.0 - 8.0   Glucose, UA NEGATIVE NEGATIVE mg/dL   Hgb urine dipstick NEGATIVE NEGATIVE   Bilirubin Urine NEGATIVE NEGATIVE   Ketones, ur 5 (A) NEGATIVE mg/dL   Protein, ur NEGATIVE NEGATIVE mg/dL   Nitrite NEGATIVE NEGATIVE   Leukocytes, UA NEGATIVE NEGATIVE  Comprehensive metabolic panel  Result Value Ref Range   Sodium 135 135 - 145 mmol/L   Potassium 4.4 3.5 - 5.1 mmol/L   Chloride 100 (L) 101 - 111 mmol/L   CO2 27 22 - 32 mmol/L   Glucose, Bld 103 (H) 65 - 99 mg/dL   BUN 22 (H) 6 - 20 mg/dL   Creatinine, Ser 7.82 0.61 - 1.24 mg/dL   Calcium 9.1 8.9 - 95.6 mg/dL   Total Protein 7.0 6.5 - 8.1 g/dL   Albumin 4.3 3.5 - 5.0 g/dL   AST 25 15 - 41 U/L   ALT 20 17 - 63 U/L   Alkaline Phosphatase 75 38 - 126 U/L   Total Bilirubin 0.8 0.3 - 1.2 mg/dL   GFR calc non Af Amer >60 >60 mL/min   GFR calc Af Amer >60 >60 mL/min   Anion gap  8 5 - 15   Ct Head Wo Contrast  Result Date: 01/28/2017 CLINICAL DATA:  Patient woke up this morning with disorientation and loss of memory. Altered level of consciousness is unexplained. EXAM: CT HEAD WITHOUT CONTRAST TECHNIQUE: Contiguous axial images were obtained from the base of the skull through the vertex without intravenous contrast. COMPARISON:  08/09/2015 FINDINGS: Brain: Mild cerebral atrophy. There is a focal area of encephalomalacia in the left anteromedial frontal lobe inferiorly. This is unchanged since previous study consistent with an old infarct. Mild ventricular dilatation consistent  with central atrophy. No mass effect or midline shift. No abnormal extra-axial fluid collections. Gray-white matter junctions are distinct. Basal cisterns are not effaced. No acute intracranial hemorrhage. Vascular: Mild internal carotid artery vascular calcifications. Skull: Calvarium appears intact. No acute depressed skull fractures. Sinuses/Orbits: Paranasal sinuses and mastoid air cells are clear. Other: None. IMPRESSION: No acute intracranial abnormalities. Mild chronic atrophy. Old left frontal infarct. Electronically Signed   By: Burman Nieves M.D.   On: 01/28/2017 21:38    EKG  EKG Interpretation None       Radiology Ct Head Wo Contrast  Result Date: 01/28/2017 CLINICAL DATA:  Patient woke up this morning with disorientation and loss of memory. Altered level of consciousness is unexplained. EXAM: CT HEAD WITHOUT CONTRAST TECHNIQUE: Contiguous axial images were obtained from the base of the skull through the vertex without intravenous contrast. COMPARISON:  08/09/2015 FINDINGS: Brain: Mild cerebral atrophy. There is a focal area of encephalomalacia in the left anteromedial frontal lobe inferiorly. This is unchanged since previous study consistent with an old infarct. Mild ventricular dilatation consistent with central atrophy. No mass effect or midline shift. No abnormal extra-axial fluid  collections. Gray-white matter junctions are distinct. Basal cisterns are not effaced. No acute intracranial hemorrhage. Vascular: Mild internal carotid artery vascular calcifications. Skull: Calvarium appears intact. No acute depressed skull fractures. Sinuses/Orbits: Paranasal sinuses and mastoid air cells are clear. Other: None. IMPRESSION: No acute intracranial abnormalities. Mild chronic atrophy. Old left frontal infarct. Electronically Signed   By: Burman Nieves M.D.   On: 01/28/2017 21:38    Procedures Procedures (including critical care time)  Medications Ordered in ED Medications - No data to display   Initial Impression / Assessment and Plan / ED Course  I have reviewed the triage vital signs and the nursing notes.  Pertinent labs & imaging results that were available during my care of the patient were reviewed by me and considered in my medical decision making (see chart for details).  Labs.   Reviewed nursing notes and prior charts for additional history.   Pt lives w son - family indicates patient currently appears c/w baseline.  Pt also states feels fine, no current symptoms.   ?Crist Infante, will refer to neurology f/u. Asa q day.   Final Clinical Impressions(s) / ED Diagnoses   Final diagnoses:  None    New Prescriptions New Prescriptions   No medications on file     Cathren Laine, MD 01/28/17 2257

## 2017-01-28 NOTE — ED Notes (Signed)
Nurse currently in room and will draw labs.

## 2017-01-28 NOTE — ED Notes (Signed)
Pt verbalized understanding of d/c instructions and has no further questions. VSS, NAD. Pt removed all belongings from room. Pt to follow up with neuro.

## 2017-01-28 NOTE — Discharge Instructions (Signed)
It was our pleasure to provide your ER care today - we hope that you feel better.  Take an enteric coated aspirin a day.  Your blood pressure is high today - take your blood pressure medication, and follow up with primary care doctor in the coming week.   For recent symptoms, follow up with neurologist in the next 1-2 weeks - see referral - call office Monday AM to arrange appointment time.   Return to ER if worse, new symptoms, change in speech or vision, one-sided numbness or weakness, other concern.

## 2017-01-28 NOTE — ED Triage Notes (Signed)
Pt states he woke up this morning at 0600 and felt disoriented and unable to remember where things were in his home. Pt answers all questions appropriately at this time.

## 2017-02-03 ENCOUNTER — Ambulatory Visit (INDEPENDENT_AMBULATORY_CARE_PROVIDER_SITE_OTHER): Payer: Medicare Other | Admitting: Neurology

## 2017-02-03 ENCOUNTER — Encounter: Payer: Self-pay | Admitting: Neurology

## 2017-02-03 VITALS — BP 120/71 | HR 54 | Resp 18 | Ht 70.0 in | Wt 122.0 lb

## 2017-02-03 DIAGNOSIS — R200 Anesthesia of skin: Secondary | ICD-10-CM | POA: Insufficient documentation

## 2017-02-03 DIAGNOSIS — R269 Unspecified abnormalities of gait and mobility: Secondary | ICD-10-CM | POA: Insufficient documentation

## 2017-02-03 DIAGNOSIS — G629 Polyneuropathy, unspecified: Secondary | ICD-10-CM

## 2017-02-03 DIAGNOSIS — G3281 Cerebellar ataxia in diseases classified elsewhere: Secondary | ICD-10-CM | POA: Diagnosis not present

## 2017-02-03 DIAGNOSIS — R413 Other amnesia: Secondary | ICD-10-CM | POA: Diagnosis not present

## 2017-02-03 NOTE — Progress Notes (Signed)
[facility]  PATIENT: David Scott DOB: Aug 30, 1940  REFERRING DOCTOR OR PCP:  Maury Dus, MD SOURCE: patient, notes from the ED, imaging and lab report, CT head images on PACS.  _________________________________   HISTORICAL  CHIEF COMPLAINT:  Chief Complaint  Patient presents with  . Memory Loss    HISTORY OF PRESENT ILLNESS:  I had the pleasure seeing your patient, David Scott, at [facility] for neurologic consultation regarding his altered mental status episode last week and difficulty with his memory.    .    On 01/28/2017, he presented to the emergency room feeling confused and disoriented that morning he felt fine when he went to bed the previous night.   Arland does not remember that day.   His son was with him and noted that he was fine physically but did not remember things he should like where his money was.    He presented to the emergency room at [facility] and felt fine by the time he was evaluated without any new complaints. A CT scan was performed. I personally reviewed the images. There were no acute findings and an old left inferior frontal lobe stroke.   CBC and CMP were essentially normal. There was no evidence of UTI.   He was felt to be back to baseline according to his family and was discharged without admission.   However, his some still feels his STM is not completely back to baseline.  He hasn't felt comfortable enough to return back to driving     He reports sleeping well most nights.   He falls asleep quick but will occasionally wake up.   He does not have noticeable snoring.   He denies actual depression .  However he is more apathetic.  No significant weight loss (3 pound loss in past year) but he feels decreased appetite.   His son does not feel he's depressed.    He spends his typical day going out ofr breakfast ([employer] often) and drives around to do some chores.     He denies any change in gait or  strength.     I personally reviewed the CT scan dated 01/28/2017 and compared to CT scan dated 08/09/2015. He has a small old infarct in the anterior inferior left frontal lobe present on both scans. There is mild atrophy that could be appropriate for age. There were no acute findings.  REVIEW OF SYSTEMS: Constitutional: No fevers, chills, sweats, or change in appetite Eyes: No visual changes, double vision, eye pain Ear, nose and throat: No hearing loss, ear pain, nasal congestion, sore throat Cardiovascular: No chest pain, palpitations Respiratory: No shortness of breath at rest or with exertion.   He takes ventolin at night.   GastrointestinaI: No nausea, vomiting, diarrhea, abdominal pain, fecal incontinence Genitourinary: No dysuria, urinary retention or frequency.  No nocturia. Musculoskeletal: No neck pain, back pain Integumentary: No rash, pruritus, skin lesions Neurological: as above Psychiatric: No depression at this time.  No anxiety Endocrine: No palpitations, diaphoresis, change in appetite, change in weigh or increased thirst Hematologic/Lymphatic: No anemia, purpura, petechiae. Allergic/Immunologic: No itchy/runny eyes, nasal congestion, recent allergic reactions, rashes  ALLERGIES: No Known Allergies  HOME MEDICATIONS:  Current Outpatient Prescriptions:  .  Cholecalciferol (VITAMIN D PO), Take 1 tablet by mouth daily as needed (for additional supplementation). , Disp: , Rfl:  .  fish oil-omega-3 fatty acids 1000 MG capsule, Take 1 g by mouth daily.  , Disp: , Rfl:  .  lisinopril (PRINIVIL,ZESTRIL) 10 MG tablet, Take 1 tablet (10 mg total) by mouth daily., Disp: 30 tablet, Rfl: 0 .  MAGNESIUM OXIDE PO, Take 250 mg by mouth daily. , Disp: , Rfl:  .  Multiple Vitamin (MULTIVITAMIN) tablet, Take 1 tablet by mouth daily., Disp: , Rfl:  .  predniSONE (DELTASONE) 20 MG tablet, Take 3 tablets (60 mg total) by mouth daily., Disp: 12 tablet, Rfl: 0 .  Propylene Glycol  (SYSTANE BALANCE) 0.6 % SOLN, Place 1 drop into the right eye daily as needed., Disp: , Rfl:  .  vitamin C (ASCORBIC ACID) 500 MG tablet, Take 500 mg by mouth daily. , Disp: , Rfl:   PAST MEDICAL HISTORY: Past Medical History:  Diagnosis Date  . Esophageal reflux     PAST SURGICAL HISTORY: No past surgical history on file.  FAMILY HISTORY: No family history on file.  SOCIAL HISTORY:  Social History   Social History  . Marital status: Single    Spouse name: N/A  . Number of children: N/A  . Years of education: N/A   Occupational History  . Not on file.   Social History Main Topics  . Smoking status: Never Smoker  . Smokeless tobacco: Never Used  . Alcohol use No  . Drug use: No  . Sexual activity: Not on file   Other Topics Concern  . Not on file   Social History Narrative  . No narrative on file     PHYSICAL EXAM  Vitals:   02/03/17 0944  BP: 120/71  Pulse: (!) 54  Resp: 18  Weight: 122 lb (55.3 kg)  Height: 5' 10" (1.778 m)    Body mass index is 17.51 kg/m.   General: The patient is well-developed and well-nourished and in no acute distress  Eyes:  Funduscopic exam shows normal optic discs and retinal vessels.  Neck: The neck is supple, no carotid bruits are noted.  The neck is nontender.  Cardiovascular: The heart has a regular rate and rhythm with a normal S1 and S2. There were no murmurs, gallops or rubs. Lungs are clear to auscultation.  Skin: Extremities are without significant edema.  Musculoskeletal:  Back is nontender  Neurologic Exam  Mental status: The patient is alert and oriented x 3 at the time of the examination. The patient has apparent normal recent and remote memory, with an apparently normal attention span and concentration ability.   Speech is normal.  Cranial nerves: Extraocular movements are full. Pupils are equal, round, and reactive to light and accomodation.  Visual fields are full.  Facial symmetry is present. There  is good facial sensation to soft touch bilaterally.Facial strength is normal.  Trapezius and sternocleidomastoid strength is normal. No dysarthria is noted.  The tongue is midline, and the patient has symmetric elevation of the soft palate. No obvious hearing deficits are noted.  Motor:  He is bradykinetic. There is no tremor. Muscle bulk is normal.   Tone is normal. Strength is  5 / 5 in all 4 extremities.   Sensory: Sensory testing is intact to pinprick, soft touch and vibration sensation in the arms. However, he has reduced vibration sensation at the toes worse than ankles and reduced temperature sensation at the ankle but normal touch.    Coordination: Cerebellar testing reveals good finger-nose-finger and heel-to-shin bilaterally.  Gait and station: Station is normal.  His gait has a reduced stride and he looks down as he walks. He turns in 3 steps.  There is retropulsion  with moderate disturbance of gait.   Reflexes: Deep tendon reflexes are symmetric and normal bilaterally.   Plantar responses are flexor.    DIAGNOSTIC DATA (LABS, IMAGING, TESTING) - I reviewed patient records, labs, notes, testing and imaging myself where available.  Lab Results  Component Value Date   WBC 6.6 01/28/2017   HGB 15.0 01/28/2017   HCT 44.0 01/28/2017   MCV 96.5 01/28/2017   PLT 214 01/28/2017      Component Value Date/Time   NA 135 01/28/2017 2044   K 4.4 01/28/2017 2044   CL 100 (L) 01/28/2017 2044   CO2 27 01/28/2017 2044   GLUCOSE 103 (H) 01/28/2017 2044   BUN 22 (H) 01/28/2017 2044   CREATININE 1.05 01/28/2017 2044   CALCIUM 9.1 01/28/2017 2044   PROT 7.0 01/28/2017 2044   ALBUMIN 4.3 01/28/2017 2044   AST 25 01/28/2017 2044   ALT 20 01/28/2017 2044   ALKPHOS 75 01/28/2017 2044   BILITOT 0.8 01/28/2017 2044   GFRNONAA >60 01/28/2017 2044   GFRAA >60 01/28/2017 2044       ASSESSMENT AND PLAN  Memory loss - Plan: MR BRAIN WO CONTRAST, Vitamin B12, Sedimentation rate, TSH,  Multiple Myeloma Panel (SPEP&IFE w/QIG)  Cerebellar ataxia in diseases classified elsewhere ([place]) - Plan: MR BRAIN WO CONTRAST  Gait disturbance  Numbness - Plan: Vitamin B12, Multiple Myeloma Panel (SPEP&IFE w/QIG)  Polyneuropathy - Plan: Vitamin B12, Sedimentation rate, Multiple Myeloma Panel (SPEP&IFE w/QIG)     In summary, Kunio Cummiskey is a 76 year old man who has had some memory loss recently and had an episode of more severe memory loss last week. The son feels he is mostly back to normal but that his memory still seems worse since last week and he feels uncomfortable about driving which is unusual for him.     He scored a 28/30 on the Montral cognitive assessment test which is in the normal range. Specifically short-term memory was 5/5 and visual spatial tasks were performed well.   On exam, however, he does appear to be bradykinetic and he had retropulsion with his gait. I can't rule out that he has Lewy body disease which could be contributing to his symptoms though Alzheimer's is unlikely.    He also has some numbness in his feet and a polyneuropathy might also be contributing to some of his symptoms. He also has become more apathetic and depression could be playing a role.  To try to sort out the causes of his cognitive and gait changes we will check an MRI of the brain without contrast to determine if he has had ischemic changes that could be leading to the neurologic deficits and also to rule out other CNS processes. We will also check B12, SPEP/IEF, ESR to rule out treatable causes of polyneuropathy and memory difficulty. If numbness or gait worsens, we will check a NCV/EMG.  He will return to see me in 2 months. If not any better, or if he worsens, I would recommend a trial of an antidepressant as mood issues could also be contributing to his symptoms.    Thank you breast invasive missed [place]. Please let me know if I can be of further assistance with him all the patient's the  future.  Leana Springston A. Felecia Shelling, MD, [facility] 76/28/3151, 7:61 AM Certified in Neurology, Clinical Neurophysiology, Sleep Medicine, Pain Medicine and Neuroimaging  [facility] [address] [place], [place] [address] [phone]

## 2017-02-08 LAB — MULTIPLE MYELOMA PANEL, SERUM
Albumin SerPl Elph-Mcnc: 3.5 g/dL (ref 2.9–4.4)
Albumin/Glob SerPl: 1.3 (ref 0.7–1.7)
Alpha 1: 0.2 g/dL (ref 0.0–0.4)
Alpha2 Glob SerPl Elph-Mcnc: 0.7 g/dL (ref 0.4–1.0)
B-GLOBULIN SERPL ELPH-MCNC: 1.1 g/dL (ref 0.7–1.3)
GLOBULIN, TOTAL: 2.8 g/dL (ref 2.2–3.9)
Gamma Glob SerPl Elph-Mcnc: 0.8 g/dL (ref 0.4–1.8)
IgA/Immunoglobulin A, Serum: 287 mg/dL (ref 61–437)
IgG (Immunoglobin G), Serum: 910 mg/dL (ref 700–1600)
IgM (Immunoglobulin M), Srm: 36 mg/dL (ref 15–143)
TOTAL PROTEIN: 6.3 g/dL (ref 6.0–8.5)

## 2017-02-08 LAB — VITAMIN B12: Vitamin B-12: 882 pg/mL (ref 232–1245)

## 2017-02-08 LAB — SEDIMENTATION RATE: SED RATE: 2 mm/h (ref 0–30)

## 2017-02-08 LAB — TSH: TSH: 0.622 u[IU]/mL (ref 0.450–4.500)

## 2017-02-09 ENCOUNTER — Telehealth: Payer: Self-pay | Admitting: *Deleted

## 2017-02-09 NOTE — Telephone Encounter (Signed)
-----   Message from Asa Lenteichard A Sater, MD sent at 02/08/2017  6:12 PM EDT ----- Please let the patient know that the lab work is fine.

## 2017-02-09 NOTE — Telephone Encounter (Signed)
LMOM with below lab results.  He does not need to return this call unless he has questions/fim 

## 2017-02-23 ENCOUNTER — Telehealth: Payer: Self-pay | Admitting: *Deleted

## 2017-02-23 ENCOUNTER — Ambulatory Visit
Admission: RE | Admit: 2017-02-23 | Discharge: 2017-02-23 | Disposition: A | Discharge status: Home / Self Care | Payer: Medicare Other | Source: Ambulatory Visit | Attending: Neurology | Admitting: Neurology

## 2017-02-23 DIAGNOSIS — G3281 Cerebellar ataxia in diseases classified elsewhere: Secondary | ICD-10-CM

## 2017-02-23 DIAGNOSIS — Z8673 Personal history of transient ischemic attack (TIA), and cerebral infarction without residual deficits: Secondary | ICD-10-CM

## 2017-02-23 DIAGNOSIS — R413 Other amnesia: Secondary | ICD-10-CM

## 2017-02-23 DIAGNOSIS — I69993 Ataxia following unspecified cerebrovascular disease: Secondary | ICD-10-CM

## 2017-02-23 NOTE — Telephone Encounter (Signed)
-----   Message from Richard A Sater, MD sent at 02/23/2017  3:28 PM EDT ----- Please let him know that he has a small stroke in the back of the brain on the right likely explains the symptoms earlier this month        That she do the following: 1.   Aspirin 81 mg daily 2.    Carotid Doppler study 3.   Echocardiogram with bubble contrast 

## 2017-02-23 NOTE — Telephone Encounter (Signed)
-----   Message from Asa Lenteichard A Sater, MD sent at 02/23/2017  3:28 PM EDT ----- Please let him know that he has a small stroke in the back of the brain on the right likely explains the symptoms earlier this month        That she do the following: 1.   Aspirin 81 mg daily 2.    Carotid Doppler study 3.   Echocardiogram with bubble contrast

## 2017-02-23 NOTE — Telephone Encounter (Signed)
Spoke with [name] and, in lengthy, pleasant conversation, reviewed MRI results with him. Answered mult. questions. Explained he should take daily 81mg  ASA.  Explained that carotid duplex and echo with bubble contrast studies will be ordered. Explained why these studies are ordered and what they may reveal. (CAS, PFO, other structural defects that may be cause of CVA).  Pt. verbalized understanding of all of above, is agreeable with this plan./fim

## 2017-02-23 NOTE — Telephone Encounter (Signed)
LMTC./fim 

## 2017-03-28 ENCOUNTER — Ambulatory Visit (HOSPITAL_COMMUNITY)
Admission: RE | Admit: 2017-03-28 | Discharge: 2017-03-28 | Disposition: A | Discharge status: Home / Self Care | Payer: Medicare Other | Source: Ambulatory Visit | Attending: Neurology | Admitting: Neurology

## 2017-03-28 ENCOUNTER — Other Ambulatory Visit: Payer: Self-pay | Admitting: Neurology

## 2017-03-28 DIAGNOSIS — I69993 Ataxia following unspecified cerebrovascular disease: Secondary | ICD-10-CM | POA: Diagnosis not present

## 2017-03-28 DIAGNOSIS — R413 Other amnesia: Secondary | ICD-10-CM | POA: Diagnosis not present

## 2017-03-28 DIAGNOSIS — Z8673 Personal history of transient ischemic attack (TIA), and cerebral infarction without residual deficits: Secondary | ICD-10-CM | POA: Diagnosis not present

## 2017-03-28 DIAGNOSIS — I340 Nonrheumatic mitral (valve) insufficiency: Secondary | ICD-10-CM | POA: Insufficient documentation

## 2017-03-28 NOTE — Progress Notes (Signed)
  Echocardiogram 2D Echocardiogram has been performed.  David Scott, [name] F 03/28/2017, 3:22 PM

## 2017-03-29 ENCOUNTER — Telehealth: Payer: Self-pay | Admitting: *Deleted

## 2017-03-29 NOTE — Telephone Encounter (Signed)
-----   Message from Asa Lenteichard A Sater, MD sent at 03/28/2017  6:12 PM EST ----- Please let him know that the echocardiogram was ok, the left side of the heart was slightly enlarged but not enough to be concerned about.

## 2017-03-29 NOTE — Telephone Encounter (Signed)
LMOM with below Echo result.  He does not need to return this call unless he has questions/fim

## 2017-04-12 ENCOUNTER — Encounter: Payer: Self-pay | Admitting: Neurology

## 2017-04-12 ENCOUNTER — Encounter (INDEPENDENT_AMBULATORY_CARE_PROVIDER_SITE_OTHER): Payer: Self-pay

## 2017-04-12 ENCOUNTER — Other Ambulatory Visit: Payer: Self-pay

## 2017-04-12 ENCOUNTER — Ambulatory Visit (INDEPENDENT_AMBULATORY_CARE_PROVIDER_SITE_OTHER): Payer: Medicare Other | Admitting: Neurology

## 2017-04-12 VITALS — BP 137/72 | HR 69 | Resp 16 | Ht 70.0 in | Wt 128.5 lb

## 2017-04-12 DIAGNOSIS — I639 Cerebral infarction, unspecified: Secondary | ICD-10-CM | POA: Diagnosis not present

## 2017-04-12 DIAGNOSIS — R413 Other amnesia: Secondary | ICD-10-CM

## 2017-04-12 DIAGNOSIS — R269 Unspecified abnormalities of gait and mobility: Secondary | ICD-10-CM

## 2017-04-12 DIAGNOSIS — R200 Anesthesia of skin: Secondary | ICD-10-CM

## 2017-04-12 NOTE — Progress Notes (Signed)
[facility]  PATIENT: David Scott DOB: 1941/01/26  REFERRING DOCTOR OR PCP:  David Else, MD SOURCE: patient, notes from the ED, imaging and lab report, CT head images on PACS.  _________________________________   HISTORICAL  CHIEF COMPLAINT:  Chief Complaint  Patient presents with  . Memory Loss    MRI showed CVA.  Sts. he is compliant with ASA 81mg  qd. Feels he is doing better and sts. he is thinking about driving again.  MOCA done today/fim    HISTORY OF PRESENT ILLNESS:  David Scott is a 76 yo man with an episode of altered mental status and difficulty with his memory.    Marland Kitchen  Update 04/12/2017: Since his last visit, he has had an MRI of the brain (personally reviewed today) that did show a small acute cerebellar stroke and there was also some encephalomalacia in the left frontal lobe (also present on 2017 CT scan). He feels he is doing better since his last visit. He has been taking the aspirin 81 mg daily as ordered. An echocardiogram with bubble contrast did not show any significant problem (some LVH).    Carotid U/S has not been done.  He feels memory is doing about the same.    Sometimes he feels depression but not daily.    Montreal Cognitive Assessment  04/12/2017  Visuospatial/ Executive (0/5) 4  Naming (0/3) 3  Attention: Read list of digits (0/2) 2  Attention: Read list of letters (0/1) 1  Attention: Serial 7 subtraction starting at 100 (0/3) 3  Language: Repeat phrase (0/2) 2  Language : Fluency (0/1) 0  Abstraction (0/2) 2  Delayed Recall (0/5) 3  Orientation (0/6) 6  Total 26  Adjusted Score (based on education) 26      From 02/03/2017: On 01/28/2017, he presented to the emergency room feeling confused and disoriented that morning he felt fine when he went to bed the previous night.   David Scott does not remember that day.   His son was with him and noted that he was fine physically but did not remember things he should like where  his money was.    He presented to the emergency room at [facility] and felt fine by the time he was evaluated without any new complaints. A CT scan was performed. I personally reviewed the images. There were no acute findings and an old left inferior frontal lobe stroke.   CBC and CMP were essentially normal. There was no evidence of UTI.   He was felt to be back to baseline according to his family and was discharged without admission.   However, his some still feels his STM is not completely back to baseline.  He hasn't felt comfortable enough to return back to driving     He reports sleeping well most nights.   He falls asleep quick but will occasionally wake up.   He does not have noticeable snoring.   He denies actual depression .  However he is more apathetic.  No significant weight loss (3 pound loss in past year) but he feels decreased appetite.   His son does not feel he's depressed.    He spends his typical day going out ofr breakfast ([employer] often) and drives around to do some chores.     He denies any change in gait or strength.     I personally reviewed the CT scan dated 01/28/2017 and compared to CT scan dated 08/09/2015. He has a small old infarct in  the anterior inferior left frontal lobe present on both scans. There is mild atrophy that could be appropriate for age. There were no acute findings.  REVIEW OF SYSTEMS: Constitutional: No fevers, chills, sweats, or change in appetite Eyes: No visual changes, double vision, eye pain Ear, nose and throat: No hearing loss, ear pain, nasal congestion, sore throat Cardiovascular: No chest pain, palpitations Respiratory: No shortness of breath at rest or with exertion.   He takes ventolin at night.   GastrointestinaI: No nausea, vomiting, diarrhea, abdominal pain, fecal incontinence Genitourinary: No dysuria, urinary retention or frequency.  No nocturia. Musculoskeletal: No neck pain, back pain Integumentary: No rash, pruritus, skin  lesions Neurological: as above Psychiatric: No depression at this time.  No anxiety Endocrine: No palpitations, diaphoresis, change in appetite, change in weigh or increased thirst Hematologic/Lymphatic: No anemia, purpura, petechiae. Allergic/Immunologic: No itchy/runny eyes, nasal congestion, recent allergic reactions, rashes  ALLERGIES: No Known Allergies  HOME MEDICATIONS:  Current Outpatient Medications:  .  Cholecalciferol (VITAMIN D PO), Take 1 tablet by mouth daily as needed (for additional supplementation). , Disp: , Rfl:  .  fish oil-omega-3 fatty acids 1000 MG capsule, Take 1 g by mouth daily.  , Disp: , Rfl:  .  MAGNESIUM OXIDE PO, Take 250 mg by mouth daily. , Disp: , Rfl:  .  Multiple Vitamin (MULTIVITAMIN) tablet, Take 1 tablet by mouth daily., Disp: , Rfl:  .  Propylene Glycol (SYSTANE BALANCE) 0.6 % SOLN, Place 1 drop into the right eye daily as needed., Disp: , Rfl:  .  vitamin C (ASCORBIC ACID) 500 MG tablet, Take 1,000 mg by mouth daily., Disp: , Rfl:   PAST MEDICAL HISTORY: Past Medical History:  Diagnosis Date  . Esophageal reflux     PAST SURGICAL HISTORY: History reviewed. No pertinent surgical history.  FAMILY HISTORY: Family History  Problem Relation Age of Onset  . COPD Father     SOCIAL HISTORY:  Social History   Socioeconomic History  . Marital status: Single    Spouse name: Not on file  . Number of children: Not on file  . Years of education: Not on file  . Highest education level: Not on file  Social Needs  . Financial resource strain: Not on file  . Food insecurity - worry: Not on file  . Food insecurity - inability: Not on file  . Transportation needs - medical: Not on file  . Transportation needs - non-medical: Not on file  Occupational History  . Not on file  Tobacco Use  . Smoking status: Never Smoker  . Smokeless tobacco: Never Used  Substance and Sexual Activity  . Alcohol use: No  . Drug use: No  . Sexual activity: Not  on file  Other Topics Concern  . Not on file  Social History Narrative  . Not on file     PHYSICAL EXAM  Vitals:   04/12/17 1340  BP: 137/72  Pulse: 69  Resp: 16  Weight: 128 lb 8 oz (58.3 kg)  Height: 5\' 10"  (1.778 m)    Body mass index is 18.44 kg/m.   General: The patient is well-developed and well-nourished and in no acute distress  Neurologic Exam  Mental status: The patient is alert and oriented x 3 at the time of the examination. The patient has mildly reduced recent and normal remote memory, with an apparently normal attention span and concentration ability.   Speech is normal.  Cranial nerves: Extraocular movements are full. Facial strength and  sensation are normal. Trapezius and sternocleidomastoid strength is normal. No dysarthria is noted.  The tongue is midline, and the patient has symmetric elevation of the soft palate. No obvious hearing deficits are noted.  Motor:  He is mildly bradykinetic. There is no tremor. Muscle bulk is normal.   Tone is normal. Strength is  5 / 5 in all 4 extremities.   Sensory: He has intact sensation to touch and vibration in the arms. There is reduced vibration in the toes.  Coordination: Cerebellar testing reveals good finger-nose-finger and heel-to-shin bilaterally.  Gait and station: Station is normal.  His gait has a reduced stride and he looks down as he walks. He turns in 3 steps.  There is some retropulsion with disturbance of station   Reflexes: Deep tendon reflexes are symmetric and normal bilaterally.        DIAGNOSTIC DATA (LABS, IMAGING, TESTING) - I reviewed patient records, labs, notes, testing and imaging myself where available.  Lab Results  Component Value Date   WBC 6.6 01/28/2017   HGB 15.0 01/28/2017   HCT 44.0 01/28/2017   MCV 96.5 01/28/2017   PLT 214 01/28/2017      Component Value Date/Time   NA 135 01/28/2017 2044   K 4.4 01/28/2017 2044   CL 100 (L) 01/28/2017 2044   CO2 27 01/28/2017 2044     GLUCOSE 103 (H) 01/28/2017 2044   BUN 22 (H) 01/28/2017 2044   CREATININE 1.05 01/28/2017 2044   CALCIUM 9.1 01/28/2017 2044   PROT 6.3 02/03/2017 1037   ALBUMIN 4.3 01/28/2017 2044   AST 25 01/28/2017 2044   ALT 20 01/28/2017 2044   ALKPHOS 75 01/28/2017 2044   BILITOT 0.8 01/28/2017 2044   GFRNONAA >60 01/28/2017 2044   GFRAA >60 01/28/2017 2044       ASSESSMENT AND PLAN  Cerebrovascular accident (CVA), unspecified mechanism (HCC) - Plan: US Carotid Bilateral  Gait disturbance  Memory loss  Numbness   1.   Re-order carotid ultrasound 2.   Continue ASA 81 mg 3.   If numbness worsens, check NCV/EMG 4.   rtc 1 year or sooner if new or worsening neurologic problems-  Caci Orren A. Epimenio FootSater, MD, [facility],FAAN 04/12/2017, 1:58 PM Certified in Neurology, Clinical Neurophysiology, Sleep Medicine, Pain Medicine and Neuroimaging  [facility] [address], [address] [place], [place] [address] [phone]

## 2017-04-14 ENCOUNTER — Telehealth: Payer: Self-pay | Admitting: Neurology

## 2017-04-14 DIAGNOSIS — I639 Cerebral infarction, unspecified: Secondary | ICD-10-CM

## 2017-04-14 NOTE — Telephone Encounter (Signed)
Can you re order - [id number]  Carotid . Thanks Annabelle Harmanana

## 2017-04-14 NOTE — Addendum Note (Signed)
Addended by: Candis SchatzMISENHEIMER, Hurbert Duran I on: 04/14/2017 04:26 PM   Modules accepted: Orders

## 2017-04-18 ENCOUNTER — Ambulatory Visit (HOSPITAL_COMMUNITY)
Admission: RE | Admit: 2017-04-18 | Discharge: 2017-04-18 | Disposition: A | Discharge status: Home / Self Care | Payer: Medicare Other | Source: Ambulatory Visit | Attending: Surgery | Admitting: Surgery

## 2017-04-18 DIAGNOSIS — I639 Cerebral infarction, unspecified: Secondary | ICD-10-CM | POA: Diagnosis not present

## 2017-04-18 LAB — VAS US CAROTID
LCCADSYS: 55 cm/s
LCCAPDIAS: 12 cm/s
LEFT ECA DIAS: 8 cm/s
LEFT VERTEBRAL DIAS: -15 cm/s
LICADSYS: -100 cm/s
Left CCA dist dias: 12 cm/s
Left CCA prox sys: 63 cm/s
Left ICA dist dias: -25 cm/s
Left ICA prox dias: -11 cm/s
Left ICA prox sys: -53 cm/s
RCCADSYS: -116 cm/s
RCCAPDIAS: 10 cm/s
RCCAPSYS: 73 cm/s
RIGHT CCA MID DIAS: 9 cm/s
RIGHT ECA DIAS: 7 cm/s
RIGHT VERTEBRAL DIAS: 11 cm/s

## 2017-04-20 NOTE — Telephone Encounter (Signed)
Patient has been scheduled

## 2017-05-26 DIAGNOSIS — Z1211 Encounter for screening for malignant neoplasm of colon: Secondary | ICD-10-CM | POA: Diagnosis not present

## 2017-05-26 DIAGNOSIS — J4530 Mild persistent asthma, uncomplicated: Secondary | ICD-10-CM | POA: Diagnosis not present

## 2017-05-26 DIAGNOSIS — I700 Atherosclerosis of aorta: Secondary | ICD-10-CM | POA: Diagnosis not present

## 2017-05-26 DIAGNOSIS — E46 Unspecified protein-calorie malnutrition: Secondary | ICD-10-CM | POA: Diagnosis not present

## 2017-05-26 DIAGNOSIS — Z0000 Encounter for general adult medical examination without abnormal findings: Secondary | ICD-10-CM | POA: Diagnosis not present

## 2017-05-26 DIAGNOSIS — N401 Enlarged prostate with lower urinary tract symptoms: Secondary | ICD-10-CM | POA: Diagnosis not present

## 2017-05-26 DIAGNOSIS — I10 Essential (primary) hypertension: Secondary | ICD-10-CM | POA: Diagnosis not present

## 2017-05-26 DIAGNOSIS — Z1389 Encounter for screening for other disorder: Secondary | ICD-10-CM | POA: Diagnosis not present

## 2017-05-26 DIAGNOSIS — J45909 Unspecified asthma, uncomplicated: Secondary | ICD-10-CM | POA: Diagnosis not present

## 2017-11-02 DIAGNOSIS — R718 Other abnormality of red blood cells: Secondary | ICD-10-CM | POA: Diagnosis not present

## 2017-11-02 DIAGNOSIS — R202 Paresthesia of skin: Secondary | ICD-10-CM | POA: Diagnosis not present

## 2017-11-02 DIAGNOSIS — J4530 Mild persistent asthma, uncomplicated: Secondary | ICD-10-CM | POA: Diagnosis not present

## 2017-11-02 DIAGNOSIS — R269 Unspecified abnormalities of gait and mobility: Secondary | ICD-10-CM | POA: Diagnosis not present

## 2017-11-02 DIAGNOSIS — R030 Elevated blood-pressure reading, without diagnosis of hypertension: Secondary | ICD-10-CM | POA: Diagnosis not present

## 2017-11-03 ENCOUNTER — Emergency Department (HOSPITAL_COMMUNITY)
Admission: EM | Admit: 2017-11-03 | Discharge: 2017-11-03 | Disposition: A | Discharge status: Home / Self Care | Payer: Medicare Other | Attending: Emergency Medicine | Admitting: Emergency Medicine

## 2017-11-03 ENCOUNTER — Other Ambulatory Visit: Payer: Self-pay

## 2017-11-03 ENCOUNTER — Emergency Department (HOSPITAL_COMMUNITY): Payer: Medicare Other

## 2017-11-03 ENCOUNTER — Encounter (HOSPITAL_COMMUNITY): Payer: Self-pay | Admitting: Emergency Medicine

## 2017-11-03 DIAGNOSIS — R2689 Other abnormalities of gait and mobility: Secondary | ICD-10-CM | POA: Diagnosis not present

## 2017-11-03 DIAGNOSIS — Z79899 Other long term (current) drug therapy: Secondary | ICD-10-CM | POA: Insufficient documentation

## 2017-11-03 DIAGNOSIS — Z8673 Personal history of transient ischemic attack (TIA), and cerebral infarction without residual deficits: Secondary | ICD-10-CM | POA: Diagnosis not present

## 2017-11-03 DIAGNOSIS — R269 Unspecified abnormalities of gait and mobility: Secondary | ICD-10-CM | POA: Diagnosis not present

## 2017-11-03 DIAGNOSIS — R001 Bradycardia, unspecified: Secondary | ICD-10-CM | POA: Diagnosis not present

## 2017-11-03 DIAGNOSIS — R5383 Other fatigue: Secondary | ICD-10-CM | POA: Diagnosis not present

## 2017-11-03 DIAGNOSIS — I517 Cardiomegaly: Secondary | ICD-10-CM | POA: Diagnosis not present

## 2017-11-03 DIAGNOSIS — G629 Polyneuropathy, unspecified: Secondary | ICD-10-CM | POA: Insufficient documentation

## 2017-11-03 HISTORY — DX: Cerebral infarction, unspecified: I63.9

## 2017-11-03 LAB — COMPREHENSIVE METABOLIC PANEL
ALBUMIN: 4 g/dL (ref 3.5–5.0)
ALT: 18 U/L (ref 0–44)
AST: 24 U/L (ref 15–41)
Alkaline Phosphatase: 68 U/L (ref 38–126)
Anion gap: 9 (ref 5–15)
BUN: 28 mg/dL — ABNORMAL HIGH (ref 8–23)
CO2: 29 mmol/L (ref 22–32)
Calcium: 9.1 mg/dL (ref 8.9–10.3)
Chloride: 100 mmol/L (ref 98–111)
Creatinine, Ser: 1.25 mg/dL — ABNORMAL HIGH (ref 0.61–1.24)
GFR calc non Af Amer: 55 mL/min — ABNORMAL LOW (ref >60)
GLUCOSE: 161 mg/dL — AB (ref 70–99)
POTASSIUM: 4.5 mmol/L (ref 3.5–5.1)
SODIUM: 138 mmol/L (ref 135–145)
TOTAL PROTEIN: 6.6 g/dL (ref 6.5–8.1)
Total Bilirubin: 0.7 mg/dL (ref 0.3–1.2)

## 2017-11-03 LAB — I-STAT CHEM 8, ED
BUN: 31 mg/dL — AB (ref 8–23)
Calcium, Ion: 1.16 mmol/L (ref 1.15–1.40)
Chloride: 100 mmol/L (ref 98–111)
Creatinine, Ser: 1.2 mg/dL (ref 0.61–1.24)
Glucose, Bld: 153 mg/dL — ABNORMAL HIGH (ref 70–99)
HEMATOCRIT: 45 % (ref 39.0–52.0)
Hemoglobin: 15.3 g/dL (ref 13.0–17.0)
Potassium: 4.4 mmol/L (ref 3.5–5.1)
SODIUM: 138 mmol/L (ref 135–145)
TCO2: 27 mmol/L (ref 22–32)

## 2017-11-03 LAB — DIFFERENTIAL
Abs Immature Granulocytes: 0 10*3/uL (ref 0.0–0.1)
BASOS ABS: 0 10*3/uL (ref 0.0–0.1)
Basophils Relative: 0 %
EOS ABS: 0.1 10*3/uL (ref 0.0–0.7)
Eosinophils Relative: 2 %
IMMATURE GRANULOCYTES: 0 %
Lymphocytes Relative: 18 %
Lymphs Abs: 1 10*3/uL (ref 0.7–4.0)
Monocytes Absolute: 0.4 10*3/uL (ref 0.1–1.0)
Monocytes Relative: 8 %
NEUTROS PCT: 72 %
Neutro Abs: 3.8 10*3/uL (ref 1.7–7.7)

## 2017-11-03 LAB — CBC
HCT: 44.4 % (ref 39.0–52.0)
Hemoglobin: 14.7 g/dL (ref 13.0–17.0)
MCH: 33 pg (ref 26.0–34.0)
MCHC: 33.1 g/dL (ref 30.0–36.0)
MCV: 99.8 fL (ref 78.0–100.0)
PLATELETS: 212 10*3/uL (ref 150–400)
RBC: 4.45 MIL/uL (ref 4.22–5.81)
RDW: 13 % (ref 11.5–15.5)
WBC: 5.4 10*3/uL (ref 4.0–10.5)

## 2017-11-03 LAB — URINALYSIS, ROUTINE W REFLEX MICROSCOPIC
BILIRUBIN URINE: NEGATIVE
GLUCOSE, UA: NEGATIVE mg/dL
HGB URINE DIPSTICK: NEGATIVE
KETONES UR: NEGATIVE mg/dL
LEUKOCYTES UA: NEGATIVE
Nitrite: NEGATIVE
PH: 5 (ref 5.0–8.0)
PROTEIN: NEGATIVE mg/dL
Specific Gravity, Urine: 1.019 (ref 1.005–1.030)

## 2017-11-03 LAB — I-STAT TROPONIN, ED: Troponin i, poc: 0 ng/mL (ref 0.00–0.08)

## 2017-11-03 LAB — PROTIME-INR
INR: 1.02
PROTHROMBIN TIME: 13.3 s (ref 11.4–15.2)

## 2017-11-03 LAB — APTT: APTT: 29 s (ref 24–36)

## 2017-11-03 LAB — LIPASE, BLOOD: Lipase: 35 U/L (ref 11–51)

## 2017-11-03 NOTE — ED Triage Notes (Signed)
Pt reports worsening of balance and gait problems over the last 2 days as well as intermittent abdominal cramping not present at this time. Pt was seen at his PCP for balance and gait problems yesterday and the patient reports that they drew blood but does not know any results. Hx of CVA less than a year ago. No changes in neurologic assessment other than gait and balance problems worsening x 2 days. Denies falls, nausea, vomiting, and pain at this time.

## 2017-11-03 NOTE — ED Provider Notes (Addendum)
[name] EMERGENCY DEPARTMENT Provider Note   CSN: [id number] Arrival date & time: 11/03/17  1700     History   Chief Complaint Chief Complaint  Patient presents with  . Gait Problem  . Abdominal Pain    HPI David Scott is a 77 y.o. male with a history of CVA (1 year ago) and GERD who presents with balance and gait problems.  Patient reports problems with his gait and balance since the death of his wife in 09-11-15. The imbalance worsened over the last two days. He says that he feels "unsteady" but denies weakness, dizziness, changes in vision, and LE numbness. He became concerned when he began having intermittent abdominal cramping (worse in the right upper quadrant). He called his son who brought him to the ED.  He was seen at his PCP for the gait and imbalance yesterday and patient reports that they drew blood but is unaware of the results.   Patient reports "feeling a funny feeling in his head" which he is unable to describe. He denies headache. He also reports several months of fatigue. He denies chest pain, SOB, dysuria, hematuria, vomiting, diarrhea, pain, and fever.  He states that he is eating and drinking well.    Past Medical History:  Diagnosis Date  . CVA (cerebral vascular accident) (HCC)   . Esophageal reflux     Patient Active Problem List   Diagnosis Date Noted  . Stroke (HCC) 04/12/2017  . Memory loss 02/03/2017  . Gait disturbance 02/03/2017  . Polyneuropathy 02/03/2017  . Numbness 02/03/2017    History reviewed. No pertinent surgical history.      Home Medications    Prior to Admission medications   Medication Sig Start Date End Date Taking? Authorizing Provider  Cholecalciferol (VITAMIN D PO) Take 1 tablet by mouth daily as needed (for additional supplementation).     [provider]  fish oil-omega-3 fatty acids 1000 MG capsule Take 1 g by mouth daily.      [provider]  MAGNESIUM OXIDE PO Take 250  mg by mouth daily.     [provider]  Multiple Vitamin (MULTIVITAMIN) tablet Take 1 tablet by mouth daily.    [provider]  Propylene Glycol (SYSTANE BALANCE) 0.6 % SOLN Place 1 drop into the right eye daily as needed.    [provider]  vitamin C (ASCORBIC ACID) 500 MG tablet Take 1,000 mg by mouth daily.    [provider]    Family History Family History  Problem Relation Age of Onset  . COPD Father     Social History Social History   Tobacco Use  . Smoking status: Never Smoker  . Smokeless tobacco: Never Used  Substance Use Topics  . Alcohol use: No  . Drug use: No     Allergies   Patient has no known allergies.   Review of Systems Review of Systems  Constitutional: Positive for fatigue. Negative for activity change, appetite change, chills and fever.  HENT: Negative for congestion and sore throat.   Respiratory: Negative for cough, chest tightness and shortness of breath.   Cardiovascular: Negative for chest pain and leg swelling.  Gastrointestinal: Negative for abdominal distention, abdominal pain, constipation, diarrhea and nausea.  Genitourinary: Negative for difficulty urinating, dysuria, frequency and hematuria.  Neurological: Negative for dizziness, light-headedness, numbness and headaches.  All other systems reviewed and are negative.   Physical Exam Updated Vital Signs BP (!) 150/71 (BP Location: Right Arm)  Pulse 68   Temp 98.2 F (36.8 C) (Oral)   Resp 16   Ht 5\' 9"  (1.753 m)   Wt 59 kg (130 lb)   SpO2 100%   BMI 19.20 kg/m   Physical Exam  Constitutional: He is oriented to person, place, and time.  Frail-looking gentleman lying in bed in no acute distress. He is intermittently shaky during the exam.  HENT:  Head: Normocephalic and atraumatic.  Eyes: Pupils are equal, round, and reactive to light. EOM are normal.  Cardiovascular: Normal rate, regular rhythm, normal heart sounds and intact distal  pulses.  Pulmonary/Chest: Effort normal and breath sounds normal.  Abdominal: Soft. Normal appearance and bowel sounds are normal. He exhibits no distension. There is no tenderness.  Neurological: He is alert and oriented to person, place, and time. He has normal strength and normal reflexes. No cranial nerve deficit or sensory deficit. He displays a negative Romberg sign.  Skin: Skin is warm and dry.  Psychiatric: He has a normal mood and affect. His behavior is normal.  Nursing note and vitals reviewed.    ED Treatments / Results  Labs (all labs ordered are listed, but only abnormal results are displayed) Labs Reviewed  COMPREHENSIVE METABOLIC PANEL - Abnormal; Notable for the following components:      Result Value   Glucose, Bld 161 (*)    BUN 28 (*)    Creatinine, Ser 1.25 (*)    GFR calc non Af Amer 55 (*)    All other components within normal limits  URINALYSIS, ROUTINE W REFLEX MICROSCOPIC - Abnormal; Notable for the following components:   Color, Urine AMBER (*)    APPearance HAZY (*)    All other components within normal limits  I-STAT CHEM 8, ED - Abnormal; Notable for the following components:   BUN 31 (*)    Glucose, Bld 153 (*)    All other components within normal limits  CBC  DIFFERENTIAL  APTT  PROTIME-INR  LIPASE, BLOOD  I-STAT TROPONIN, ED  CBG MONITORING, ED    EKG EKG Interpretation  Date/Time:  Thursday November 03 2017 17:11:27 EDT Ventricular Rate:  56 PR Interval:  154 QRS Duration: 86 QT Interval:  416 QTC Calculation: 401 R Axis:   62 Text Interpretation:  Sinus bradycardia Minimal voltage criteria for LVH, may be normal variant Anterior infarct , age undetermined Abnormal ECG No significant change since last tracing Confirmed by Melene PlanFloyd, Dan [phone]([id number]) on 11/03/2017 6:46:42 PM   Radiology Ct Head Wo Contrast  Result Date: 11/03/2017 CLINICAL DATA:  Abnormal balance and gait since yesterday. EXAM: CT HEAD WITHOUT CONTRAST TECHNIQUE: Contiguous  axial images were obtained from the base of the skull through the vertex without intravenous contrast. COMPARISON:  Head CT 01/28/2017 FINDINGS: Brain: Chronic encephalomalacia in the inferior left frontal gyrus. Minimal small vessel ischemic disease of periventricular white matter, chronic in appearance. No acute intracranial hemorrhage, midline shift or edema. No large vascular territory infarct. No hydrocephalus. Midline fourth ventricle basal cisterns without effacement. Age related mild involutional changes of the brain. Vascular: No hyperdense vessel sign. Skull: No skull fracture. Sinuses/Orbits: No acute finding. Other: None IMPRESSION: Remote left inferior frontal lobe encephalomalacia unchanged. No acute intracranial abnormality identified. Electronically Signed   By: Tollie Ethavid  Kwon M.D.   On: 11/03/2017 19:10    Procedures Procedures (including critical care time)  Medications Ordered in ED Medications - No data to display   Initial Impression / Assessment and Plan / ED Course  I  have reviewed the triage vital signs and the nursing notes.  Pertinent labs & imaging results that were available during my care of the patient were reviewed by me and considered in my medical decision making (see chart for details).  RAMADAN COUEY is a 77 y.o. male with a history of CVA (1 year ago) and GERD who presents with balance and gait problems. Patient has a long history of symptoms and was found to have no focal neurologic deficits and had an unremarkable head CT and EKG with sinus bradycardia. He also had an unremarkable UA, CMP, PT-INR/PTT, and Lipase. Patient denied IVF's for potential dehydration. His has been hypertensive at 150's-160's/60's but otherwise unremarkable vital signs. Do not expect acute abnormal neurological process occurring given findings. Recommend following up with neurologist within the next week. Sent referral to [facility]. Patient given return precautions and patient  expressed understanding.   Final Clinical Impressions(s) / ED Diagnoses   Final diagnoses:  Gait disturbance    ED Discharge Orders    None       Synetta Shadow, MD 11/03/17 2157    Synetta Shadow, MD 11/03/17 2159    Melene Plan, DO 11/03/17 2214

## 2017-11-03 NOTE — Discharge Instructions (Addendum)
We [facility]. The clinic staff should be calling you within the next several days to set up an appointment. Please call them at [phone] to set up an appointment if you do not hear from them by the beginning of next week.

## 2017-11-07 ENCOUNTER — Ambulatory Visit (INDEPENDENT_AMBULATORY_CARE_PROVIDER_SITE_OTHER): Payer: Medicare Other | Admitting: Neurology

## 2017-11-07 ENCOUNTER — Telehealth: Payer: Self-pay | Admitting: Neurology

## 2017-11-07 ENCOUNTER — Encounter: Payer: Self-pay | Admitting: Neurology

## 2017-11-07 VITALS — BP 133/63 | HR 58 | Wt 129.0 lb

## 2017-11-07 DIAGNOSIS — G629 Polyneuropathy, unspecified: Secondary | ICD-10-CM | POA: Diagnosis not present

## 2017-11-07 DIAGNOSIS — I639 Cerebral infarction, unspecified: Secondary | ICD-10-CM | POA: Diagnosis not present

## 2017-11-07 DIAGNOSIS — G3281 Cerebellar ataxia in diseases classified elsewhere: Secondary | ICD-10-CM | POA: Diagnosis not present

## 2017-11-07 DIAGNOSIS — R269 Unspecified abnormalities of gait and mobility: Secondary | ICD-10-CM

## 2017-11-07 NOTE — Progress Notes (Signed)
[facility]  PATIENT: CALI CUARTAS DOB: Nov 21, 1940  REFERRING DOCTOR OR PCP:  Elias Else, MD SOURCE: patient, notes from the ED, imaging and lab report, CT head images on PACS.  _________________________________   HISTORICAL  CHIEF COMPLAINT:  Chief Complaint  Patient presents with  . Gait Abnormality    Reports continued gait difficulty that has worsened further over the last few weeks.  He was seen in the ED on 11/03/17.  Denies any falls.  His ED visit was also prompted by pain on his left side that has since resolved.     HISTORY OF PRESENT ILLNESS:  Quency Tober is a 77 yo man with an episode of altered mental status and difficulty with his memory.    Marland Kitchen  Update 11/07/2017: He went to the ED last week because he was having a LUQ abdominal pain and also noted his walking was doing worse.   He has no recent falls.    In the ED, labs were c/w mild dehydration.   Imaging and lab results were reviewed.   I personally reviewed the CT scan he had in the ED and it shows the old frontal encephalomalacia but no acute findings.    Abdominal pain is better.   Gait uis the same as last year.     Late last year, an MRI showed a very small acute right cerebellar CVA and a chronic left frontal encephalomalacia.   He is on ASA prophylaxis.   He has had an Echo with bubble contrast and it was ok except mild LVH.    Carotid U/S was normal for age.      Update 04/12/2017: Since his last visit, he has had an MRI of the brain (personally reviewed today) that did show a small acute cerebellar stroke and there was also some encephalomalacia in the left frontal lobe (also present on 2017 CT scan). He feels he is doing better since his last visit. He has been taking the aspirin 81 mg daily as ordered. An echocardiogram with bubble contrast did not show any significant problem (some LVH).    Carotid U/S has not been done.  He feels memory is doing about the same.    Sometimes he  feels depression but not daily.    Montreal Cognitive Assessment  04/12/2017  Visuospatial/ Executive (0/5) 4  Naming (0/3) 3  Attention: Read list of digits (0/2) 2  Attention: Read list of letters (0/1) 1  Attention: Serial 7 subtraction starting at 100 (0/3) 3  Language: Repeat phrase (0/2) 2  Language : Fluency (0/1) 0  Abstraction (0/2) 2  Delayed Recall (0/5) 3  Orientation (0/6) 6  Total 26  Adjusted Score (based on education) 26      From 02/03/2017: On 01/28/2017, he presented to the emergency room feeling confused and disoriented that morning he felt fine when he went to bed the previous night.   Foch does not remember that day.   His son was with him and noted that he was fine physically but did not remember things he should like where his money was.    He presented to the emergency room at [facility] and felt fine by the time he was evaluated without any new complaints. A CT scan was performed. I personally reviewed the images. There were no acute findings and an old left inferior frontal lobe stroke.   CBC and CMP were essentially normal. There was no evidence of UTI.   He was  felt to be back to baseline according to his family and was discharged without admission.   However, his some still feels his STM is not completely back to baseline.  He hasn't felt comfortable enough to return back to driving     He reports sleeping well most nights.   He falls asleep quick but will occasionally wake up.   He does not have noticeable snoring.   He denies actual depression .  However he is more apathetic.  No significant weight loss (3 pound loss in past year) but he feels decreased appetite.   His son does not feel he's depressed.    He spends his typical day going out ofr breakfast ([employer] often) and drives around to do some chores.     He denies any change in gait or strength.     I personally reviewed the CT scan dated 01/28/2017 and compared to CT scan dated 08/09/2015. He has a  small old infarct in the anterior inferior left frontal lobe present on both scans. There is mild atrophy that could be appropriate for age. There were no acute findings.  REVIEW OF SYSTEMS: Constitutional: No fevers, chills, sweats, or change in appetite Eyes: No visual changes, double vision, eye pain Ear, nose and throat: No hearing loss, ear pain, nasal congestion, sore throat Cardiovascular: No chest pain, palpitations Respiratory: No shortness of breath at rest or with exertion.   He takes ventolin at night.   GastrointestinaI: No nausea, vomiting, diarrhea, abdominal pain, fecal incontinence Genitourinary: No dysuria, urinary retention or frequency.  No nocturia. Musculoskeletal: No neck pain, back pain Integumentary: No rash, pruritus, skin lesions Neurological: as above Psychiatric: No depression at this time.  No anxiety Endocrine: No palpitations, diaphoresis, change in appetite, change in weigh or increased thirst Hematologic/Lymphatic: No anemia, purpura, petechiae. Allergic/Immunologic: No itchy/runny eyes, nasal congestion, recent allergic reactions, rashes  ALLERGIES: No Known Allergies  HOME MEDICATIONS:  Current Outpatient Medications:  .  albuterol (PROVENTIL HFA;VENTOLIN HFA) 108 (90 Base) MCG/ACT inhaler, Inhale 1 puff into the lungs every 6 (six) hours as needed for shortness of breath., Disp: , Rfl:  .  aspirin 81 MG tablet, Take 81 mg by mouth daily., Disp: , Rfl:  .  Cholecalciferol (VITAMIN D PO), Take 1 tablet by mouth daily as needed (for additional supplementation). , Disp: , Rfl:  .  fish oil-omega-3 fatty acids 1000 MG capsule, Take 1 g by mouth daily.  , Disp: , Rfl:  .  MAGNESIUM OXIDE PO, Take 250 mg by mouth daily. , Disp: , Rfl:  .  Multiple Vitamin (MULTIVITAMIN) tablet, Take 1 tablet by mouth daily., Disp: , Rfl:  .  Propylene Glycol (SYSTANE BALANCE) 0.6 % SOLN, Place 1 drop into the right eye daily as needed., Disp: , Rfl:  .  vitamin C  (ASCORBIC ACID) 500 MG tablet, Take 1,000 mg by mouth daily., Disp: , Rfl:   PAST MEDICAL HISTORY: Past Medical History:  Diagnosis Date  . CVA (cerebral vascular accident) (HCC)   . Esophageal reflux     PAST SURGICAL HISTORY: History reviewed. No pertinent surgical history.  FAMILY HISTORY: Family History  Problem Relation Age of Onset  . COPD Father     SOCIAL HISTORY:  Social History   Socioeconomic History  . Marital status: Single    Spouse name: Not on file  . Number of children: Not on file  . Years of education: Not on file  . Highest education level: Not on file  Occupational History  . Not on file  Social Needs  . Financial resource strain: Not on file  . Food insecurity:    Worry: Not on file    Inability: Not on file  . Transportation needs:    Medical: Not on file    Non-medical: Not on file  Tobacco Use  . Smoking status: Never Smoker  . Smokeless tobacco: Never Used  Substance and Sexual Activity  . Alcohol use: No  . Drug use: No  . Sexual activity: Not on file  Lifestyle  . Physical activity:    Days per week: Not on file    Minutes per session: Not on file  . Stress: Not on file  Relationships  . Social connections:    Talks on phone: Not on file    Gets together: Not on file    Attends religious service: Not on file    Active member of club or organization: Not on file    Attends meetings of clubs or organizations: Not on file    Relationship status: Not on file  . Intimate partner violence:    Fear of current or ex partner: Not on file    Emotionally abused: Not on file    Physically abused: Not on file    Forced sexual activity: Not on file  Other Topics Concern  . Not on file  Social History Narrative  . Not on file     PHYSICAL EXAM  Vitals:   11/07/17 1534  BP: 133/63  Pulse: (!) 58  Weight: 129 lb (58.5 kg)    Body mass index is 19.05 kg/m.   General: The patient is well-developed and well-nourished and in no  acute distress.    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     Neurologic Exam  Mental status: The patient is alert and oriented x 3 at the time of the examination. The patient has mildly reduced recent and normal remote memory, with an apparently normal attention span and concentration ability.   Speech is normal.  Cranial nerves: Extraocular movements are full. Facial strength and sensation are normal. Trapezius and sternocleidomastoid strength is normal. No dysarthria is noted.  The tongue is midline, and the patient has symmetric elevation of the soft palate. No obvious hearing deficits are noted.  Motor:  He is mildly bradykinetic. There is no tremor. Muscle bulk is normal.   Tone is normal. Strength is  5 / 5 in all 4 extremities.   Sensory: He has intact sensation to touch and vibration in the arms. There is reduced vibration in the toes and minimal reduced vibration at the ankles.   Coordination: Cerebellar testing reveals good finger-nose-finger and heel-to-shin bilaterally.  Gait and station: Station is normal.  His gait has a reduced stride and he looks down as he walks. He turns 180 degrees  in 3-4 steps.  There is retropulsion with disturbance of station   Reflexes: Deep tendon reflexes are symmetric and normal bilaterally.        DIAGNOSTIC DATA (LABS, IMAGING, TESTING) - I reviewed patient records, labs, notes, testing and imaging myself where available.  Lab Results  Component Value Date   WBC 5.4 11/03/2017   HGB 15.3 11/03/2017   HCT 45.0 11/03/2017   MCV 99.8 11/03/2017   PLT 212 11/03/2017      Component Value Date/Time   NA 138 11/03/2017 1740   K 4.4 11/03/2017 1740   CL 100 11/03/2017 1740   CO2 29 11/03/2017  1726   GLUCOSE 153 (H) 11/03/2017 1740   BUN 31 (H) 11/03/2017 1740   CREATININE 1.20 11/03/2017 1740   CALCIUM 9.1 11/03/2017 1726   PROT 6.6 11/03/2017 1726   PROT 6.3 02/03/2017 1037   ALBUMIN 4.0 11/03/2017 1726   AST 24 11/03/2017 1726     ALT 18 11/03/2017 1726   ALKPHOS 68 11/03/2017 1726   BILITOT 0.7 11/03/2017 1726   GFRNONAA 55 (L) 11/03/2017 1726   GFRAA >60 11/03/2017 1726       ASSESSMENT AND PLAN  Gait disturbance  Cerebellar ataxia in diseases classified elsewhere (HCC) - Plan: MR CERVICAL SPINE WO CONTRAST  Polyneuropathy  Cerebrovascular accident (CVA), unspecified mechanism (HCC)   1.   MRI cervical spine to determine if any intrinsic or compressive myelopathy is contributing to gait issues. 2.   Continue ASA 81 mg 3.   If numbness worsens, check NCV/EMG 4.   rtc 1 year or sooner if new or worsening neurologic problems-  Richard A. Epimenio FootSater, MD, [facility],FAAN 11/07/2017, 6:18 PM Certified in Neurology, Clinical Neurophysiology, Sleep Medicine, Pain Medicine and Neuroimaging  [facility] [address], [address] [place], [place] [address] [phone]

## 2017-11-07 NOTE — Telephone Encounter (Signed)
Medicare/bankers of life order sent to [facility]. They will reach out to the pt to schedule.

## 2017-11-07 NOTE — Telephone Encounter (Signed)
error 

## 2017-11-12 ENCOUNTER — Ambulatory Visit
Admission: RE | Admit: 2017-11-12 | Discharge: 2017-11-12 | Disposition: A | Discharge status: Home / Self Care | Payer: Medicare Other | Source: Ambulatory Visit | Attending: Neurology | Admitting: Neurology

## 2017-11-12 DIAGNOSIS — G3281 Cerebellar ataxia in diseases classified elsewhere: Secondary | ICD-10-CM

## 2017-11-12 DIAGNOSIS — R269 Unspecified abnormalities of gait and mobility: Secondary | ICD-10-CM | POA: Diagnosis not present

## 2017-11-15 ENCOUNTER — Telehealth: Payer: Self-pay | Admitting: *Deleted

## 2017-11-15 NOTE — Telephone Encounter (Signed)
LMTC./fim 

## 2017-11-15 NOTE — Telephone Encounter (Signed)
-----   Message from Richard A Sater, MD sent at 11/14/2017  5:23 PM EDT ----- Please let him know that the MRI of the cervical spine shows a lot of arthritis but nothing that is pushing on the spinal cord or would affect his walking. 

## 2017-11-16 ENCOUNTER — Telehealth: Payer: Self-pay | Admitting: *Deleted

## 2017-11-16 NOTE — Telephone Encounter (Signed)
Spoke with Roe Coombson and reviewed below MRI results.  He verbalized understanding of same/fim

## 2017-11-16 NOTE — Telephone Encounter (Signed)
-----   Message from Asa Lenteichard A Sater, MD sent at 11/14/2017  5:23 PM EDT ----- Please let him know that the MRI of the cervical spine shows a lot of arthritis but nothing that is pushing on the spinal cord or would affect his walking.

## 2018-01-11 DIAGNOSIS — F5101 Primary insomnia: Secondary | ICD-10-CM | POA: Diagnosis not present

## 2018-01-11 DIAGNOSIS — H6123 Impacted cerumen, bilateral: Secondary | ICD-10-CM | POA: Diagnosis not present

## 2018-02-13 ENCOUNTER — Telehealth: Payer: Self-pay | Admitting: Neurology

## 2018-02-13 ENCOUNTER — Encounter: Payer: Self-pay | Admitting: Neurology

## 2018-02-13 ENCOUNTER — Ambulatory Visit (INDEPENDENT_AMBULATORY_CARE_PROVIDER_SITE_OTHER): Payer: Medicare Other | Admitting: Neurology

## 2018-02-13 ENCOUNTER — Other Ambulatory Visit: Payer: Self-pay

## 2018-02-13 ENCOUNTER — Telehealth: Payer: Self-pay | Admitting: *Deleted

## 2018-02-13 VITALS — BP 127/66 | HR 62 | Resp 16 | Ht 70.0 in | Wt 128.5 lb

## 2018-02-13 DIAGNOSIS — R269 Unspecified abnormalities of gait and mobility: Secondary | ICD-10-CM | POA: Diagnosis not present

## 2018-02-13 DIAGNOSIS — I639 Cerebral infarction, unspecified: Secondary | ICD-10-CM | POA: Diagnosis not present

## 2018-02-13 DIAGNOSIS — R200 Anesthesia of skin: Secondary | ICD-10-CM

## 2018-02-13 DIAGNOSIS — G629 Polyneuropathy, unspecified: Secondary | ICD-10-CM

## 2018-02-13 DIAGNOSIS — R413 Other amnesia: Secondary | ICD-10-CM

## 2018-02-13 NOTE — Progress Notes (Signed)
[facility]  PATIENT: David Scott DOB: February 27, 1941  REFERRING DOCTOR OR PCP:  Elias Else, MD SOURCE: patient, notes from the ED, imaging and lab report, CT head images on PACS.  _________________________________   HISTORICAL  CHIEF COMPLAINT:  Chief Complaint  Patient presents with  . Gait Disturbance  . Hx. of CVA    HISTORY OF PRESENT ILLNESS:  David Scott is a 77 y.o. man with gait disturbance and new neurologic symptoms.  Update 02/13/2018: He walks in today, unscheduled, stating he has not been feeling good.  He feels like there is a weight on his head.   This is not painful but feels different.   He denies any new change in gait though gait has ben worse this year.    He denies any new numbness or weakness.   He denies recent change in bladder function though has some urgency at times, worse the past few years.     He notes some mild memory difficulties but feels that this is stable and unchanged since the last visit.  MRI cervical spine 11/14/2017 (performed due to worsening gait) showed multilevel DJD with mild spinal stenosis at C2C3 and C3C4 and borderline spinal stenosis at C5C6.     MRI 02/23/2017 had shown a small acute on chronic stroke in the right PICA distribution of the cerebellum.    He has left inferior chronic post traumatic encephalomalacia  Update 11/07/2017: He went to the ED last week because he was having a LUQ abdominal pain and also noted his walking was doing worse.   He has no recent falls.    In the ED, labs were c/w mild dehydration.   Imaging and lab results were reviewed.   I personally reviewed the CT scan he had in the ED and it shows the old frontal encephalomalacia but no acute findings.    Abdominal pain is better.   Gait uis the same as last year.     Late last year, an MRI showed a very small acute right cerebellar CVA and a chronic left frontal encephalomalacia.   He is on ASA prophylaxis.   He has had an Echo  with bubble contrast and it was ok except mild LVH.    Carotid U/S was normal for age.      Update 04/12/2017: Since his last visit, he has had an MRI of the brain (personally reviewed today) that did show a small acute cerebellar stroke and there was also some encephalomalacia in the left frontal lobe (also present on 2017 CT scan). He feels he is doing better since his last visit. He has been taking the aspirin 81 mg daily as ordered. An echocardiogram with bubble contrast did not show any significant problem (some LVH).    Carotid U/S has not been done.  He feels memory is doing about the same.    Sometimes he feels depression but not daily.    Montreal Cognitive Assessment  04/12/2017  Visuospatial/ Executive (0/5) 4  Naming (0/3) 3  Attention: Read list of digits (0/2) 2  Attention: Read list of letters (0/1) 1  Attention: Serial 7 subtraction starting at 100 (0/3) 3  Language: Repeat phrase (0/2) 2  Language : Fluency (0/1) 0  Abstraction (0/2) 2  Delayed Recall (0/5) 3  Orientation (0/6) 6  Total 26  Adjusted Score (based on education) 26      From 02/03/2017: On 01/28/2017, he presented to the emergency room feeling confused and disoriented that  morning he felt fine when he went to bed the previous night.   David Scott does not remember that day.   His son was with him and noted that he was fine physically but did not remember things he should like where his money was.    He presented to the emergency room at [facility] and felt fine by the time he was evaluated without any new complaints. A CT scan was performed. I personally reviewed the images. There were no acute findings and an old left inferior frontal lobe stroke.   CBC and CMP were essentially normal. There was no evidence of UTI.   He was felt to be back to baseline according to his family and was discharged without admission.   However, his some still feels his STM is not completely back to baseline.  He hasn't felt comfortable  enough to return back to driving     He reports sleeping well most nights.   He falls asleep quick but will occasionally wake up.   He does not have noticeable snoring.   He denies actual depression .  However he is more apathetic.  No significant weight loss (3 pound loss in past year) but he feels decreased appetite.   His son does not feel he's depressed.    He spends his typical day going out ofr breakfast ([employer] often) and drives around to do some chores.     He denies any change in gait or strength.     I personally reviewed the CT scan dated 01/28/2017 and compared to CT scan dated 08/09/2015. He has a small old infarct in the anterior inferior left frontal lobe present on both scans. There is mild atrophy that could be appropriate for age. There were no acute findings.  REVIEW OF SYSTEMS: Constitutional: No fevers, chills, sweats, or change in appetite Eyes: No visual changes, double vision, eye pain Ear, nose and throat: No hearing loss, ear pain, nasal congestion, sore throat Cardiovascular: No chest pain, palpitations Respiratory: No shortness of breath at rest or with exertion.   He takes ventolin at night.   GastrointestinaI: No nausea, vomiting, diarrhea, abdominal pain, fecal incontinence Genitourinary: No dysuria, urinary retention or frequency.  No nocturia. Musculoskeletal: No neck pain, back pain Integumentary: No rash, pruritus, skin lesions Neurological: as above Psychiatric: No depression at this time.  No anxiety Endocrine: No palpitations, diaphoresis, change in appetite, change in weigh or increased thirst Hematologic/Lymphatic: No anemia, purpura, petechiae. Allergic/Immunologic: No itchy/runny eyes, nasal congestion, recent allergic reactions, rashes  ALLERGIES: No Known Allergies  HOME MEDICATIONS:  Current Outpatient Medications:  .  albuterol (PROVENTIL HFA;VENTOLIN HFA) 108 (90 Base) MCG/ACT inhaler, Inhale 1 puff into the lungs every 6 (six) hours  as needed for shortness of breath., Disp: , Rfl:  .  aspirin 81 MG tablet, Take 81 mg by mouth daily., Disp: , Rfl:  .  Cholecalciferol (VITAMIN D PO), Take 1 tablet by mouth daily as needed (for additional supplementation). , Disp: , Rfl:  .  fish oil-omega-3 fatty acids 1000 MG capsule, Take 1 g by mouth daily.  , Disp: , Rfl:  .  MAGNESIUM OXIDE PO, Take 250 mg by mouth daily. , Disp: , Rfl:  .  Multiple Vitamin (MULTIVITAMIN) tablet, Take 1 tablet by mouth daily., Disp: , Rfl:  .  Propylene Glycol (SYSTANE BALANCE) 0.6 % SOLN, Place 1 drop into the right eye daily as needed., Disp: , Rfl:  .  vitamin C (ASCORBIC ACID) 500  MG tablet, Take 1,000 mg by mouth daily., Disp: , Rfl:   PAST MEDICAL HISTORY: Past Medical History:  Diagnosis Date  . CVA (cerebral vascular accident) (HCC)   . Esophageal reflux     PAST SURGICAL HISTORY: No past surgical history on file.  FAMILY HISTORY: Family History  Problem Relation Age of Onset  . COPD Father     SOCIAL HISTORY:  Social History   Socioeconomic History  . Marital status: Single    Spouse name: Not on file  . Number of children: Not on file  . Years of education: Not on file  . Highest education level: Not on file  Occupational History  . Not on file  Social Needs  . Financial resource strain: Not on file  . Food insecurity:    Worry: Not on file    Inability: Not on file  . Transportation needs:    Medical: Not on file    Non-medical: Not on file  Tobacco Use  . Smoking status: Never Smoker  . Smokeless tobacco: Never Used  Substance and Sexual Activity  . Alcohol use: No  . Drug use: No  . Sexual activity: Not on file  Lifestyle  . Physical activity:    Days per week: Not on file    Minutes per session: Not on file  . Stress: Not on file  Relationships  . Social connections:    Talks on phone: Not on file    Gets together: Not on file    Attends religious service: Not on file    Active member of club or  organization: Not on file    Attends meetings of clubs or organizations: Not on file    Relationship status: Not on file  . Intimate partner violence:    Fear of current or ex partner: Not on file    Emotionally abused: Not on file    Physically abused: Not on file    Forced sexual activity: Not on file  Other Topics Concern  . Not on file  Social History Narrative  . Not on file     PHYSICAL EXAM  Vitals:   02/13/18 1603  BP: 127/66  Pulse: 62  Resp: 16  Weight: 128 lb 8 oz (58.3 kg)  Height: 5\' 10"  (1.778 m)    Body mass index is 18.44 kg/m.   General: The patient is well-developed and well-nourished and in no acute distress.    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     Neurologic Exam  Mental status: The patient is alert and oriented x 3 at the time of the examination. The patient has mildly reduced recent and normal remote memory, with an apparently normal attention span and concentration ability.   Speech is normal.  Cranial nerves: Extraocular movements are full. Facial strength and sensation are normal. Trapezius and sternocleidomastoid strength is normal. No dysarthria is noted.  The tongue is midline, and the patient has symmetric elevation of the soft palate. No obvious hearing deficits are noted.  Motor:  He is mildly bradykinetic but there is no tremor and muscle tone is normal.  Muscle bulk is normal. Strength is  5 / 5 in all 4 extremities.   Sensory: He has intact sensation to touch and vibration in the arms. There is reduced vibration in the toes and ankles (about 50%).       Coordination: Cerebellar testing reveals good finger-nose-finger and heel-to-shin bilaterally.  Gait and station: Station is normal.  His gait  has a reduced stride and he looks down as he walks. He turns 180 degrees in 3-4 steps.   He has retropulsio.  There is retropulsion with disturbance of station   Reflexes: Deep tendon reflexes are 2 and symmetric in the arms and legs.       DIAGNOSTIC DATA (LABS, IMAGING, TESTING) - I reviewed patient records, labs, notes, testing and imaging myself where available.  Lab Results  Component Value Date   WBC 5.4 11/03/2017   HGB 15.3 11/03/2017   HCT 45.0 11/03/2017   MCV 99.8 11/03/2017   PLT 212 11/03/2017      Component Value Date/Time   NA 138 11/03/2017 1740   K 4.4 11/03/2017 1740   CL 100 11/03/2017 1740   CO2 29 11/03/2017 1726   GLUCOSE 153 (H) 11/03/2017 1740   BUN 31 (H) 11/03/2017 1740   CREATININE 1.20 11/03/2017 1740   CALCIUM 9.1 11/03/2017 1726   PROT 6.6 11/03/2017 1726   PROT 6.3 02/03/2017 1037   ALBUMIN 4.0 11/03/2017 1726   AST 24 11/03/2017 1726   ALT 18 11/03/2017 1726   ALKPHOS 68 11/03/2017 1726   BILITOT 0.7 11/03/2017 1726   GFRNONAA 55 (L) 11/03/2017 1726   GFRAA >60 11/03/2017 1726       ASSESSMENT AND PLAN  Gait disturbance  Cerebrovascular accident (CVA), unspecified mechanism (HCC)  Numbness  Memory loss  Polyneuropathy   1.   Head CT to make sure no acute findings explaining his new symptoms 2.   Continue ASA 81 mg (will hold if hemorrhage on CT) 3.   We discussed considering using a cane for reduced balance 4.   rtc as scheduled or sooner if new or worsening neurologic problems-  Nylee Barbuto A. Epimenio Foot, MD, [facility] 02/13/2018, 4:21 PM Certified in Neurology, Clinical Neurophysiology, Sleep Medicine, Pain Medicine and Neuroimaging  [facility] [address] [place], [place] [address] [phone]

## 2018-02-13 NOTE — Telephone Encounter (Signed)
GI no longer take walk in's after 4:30 pm. It was too late for the patient to walk in at GI. I spoke to patient and he is aware to go to GI tomorrow morning. He has their address and phone number.  Medicare/[employer] of [employer] auth: [employer]

## 2018-02-13 NOTE — Telephone Encounter (Signed)
Patient presented to lobby c/o "head feeling funny". I spoke with patient. He stated it feels like there is a weight on top of his head. This started this am. No recent med changes per pt. Denies slurred speech, trouble speaking, droopiness of face.  Spoke with Dr. Epimenio Foot. He will see pt today as work in. Placed on scheduled at 4pm.

## 2018-02-14 ENCOUNTER — Ambulatory Visit
Admission: RE | Admit: 2018-02-14 | Discharge: 2018-02-14 | Disposition: A | Discharge status: Home / Self Care | Payer: Medicare Other | Source: Ambulatory Visit | Attending: Neurology | Admitting: Neurology

## 2018-02-14 DIAGNOSIS — I639 Cerebral infarction, unspecified: Secondary | ICD-10-CM | POA: Diagnosis not present

## 2018-02-14 DIAGNOSIS — R269 Unspecified abnormalities of gait and mobility: Secondary | ICD-10-CM | POA: Diagnosis not present

## 2018-02-14 DIAGNOSIS — R200 Anesthesia of skin: Secondary | ICD-10-CM

## 2018-02-14 NOTE — Telephone Encounter (Signed)
Patient is having CT at GI today.

## 2018-02-15 ENCOUNTER — Telehealth: Payer: Self-pay | Admitting: *Deleted

## 2018-02-15 NOTE — Telephone Encounter (Signed)
-----   Message from Hillis Range, RN sent at 02/15/2018  8:15 AM EDT -----   ----- Message ----- From: Asa Lente, MD Sent: 02/14/2018   6:04 PM EDT To: Hillis Range, RN  Please let him know that the CT scan of the head was unchanged compared to the CT scan from earlier this year.

## 2018-02-15 NOTE — Telephone Encounter (Signed)
Called, LVM for pt to call about results.   Ok to inform him CT head was unchanged from CT scan from earlier this year per Dr. Epimenio Foot

## 2018-02-16 ENCOUNTER — Telehealth: Payer: Self-pay | Admitting: Neurology

## 2018-02-16 NOTE — Telephone Encounter (Signed)
Called and spoke with pt. I advised CT head unchanged from one he had earlier this year per Dr. Epimenio Foot. He verbalized understanding.

## 2018-02-16 NOTE — Telephone Encounter (Signed)
Patient is in the lobby requesting to talk to the nurse. [name] he missed a phone call.  His son is with him and said the best call back today is [phone]. They do not want to stay.

## 2018-02-17 NOTE — Telephone Encounter (Signed)
Note from 02/16/18--see other phone note   Called and spoke with pt. I advised CT head unchanged from one he had earlier this year per Dr. Epimenio Foot. He verbalized understanding.

## 2018-02-28 ENCOUNTER — Emergency Department (HOSPITAL_COMMUNITY): Payer: Medicare Other

## 2018-02-28 ENCOUNTER — Encounter (HOSPITAL_COMMUNITY): Payer: Self-pay | Admitting: Emergency Medicine

## 2018-02-28 ENCOUNTER — Other Ambulatory Visit: Payer: Self-pay

## 2018-02-28 ENCOUNTER — Emergency Department (HOSPITAL_COMMUNITY)
Admission: EM | Admit: 2018-02-28 | Discharge: 2018-02-28 | Disposition: A | Discharge status: Home / Self Care | Payer: Medicare Other | Attending: Emergency Medicine | Admitting: Emergency Medicine

## 2018-02-28 DIAGNOSIS — Z7982 Long term (current) use of aspirin: Secondary | ICD-10-CM | POA: Diagnosis not present

## 2018-02-28 DIAGNOSIS — Z79899 Other long term (current) drug therapy: Secondary | ICD-10-CM | POA: Insufficient documentation

## 2018-02-28 DIAGNOSIS — R001 Bradycardia, unspecified: Secondary | ICD-10-CM | POA: Diagnosis not present

## 2018-02-28 DIAGNOSIS — R531 Weakness: Secondary | ICD-10-CM | POA: Diagnosis not present

## 2018-02-28 LAB — URINALYSIS, ROUTINE W REFLEX MICROSCOPIC
Bilirubin Urine: NEGATIVE
Glucose, UA: NEGATIVE mg/dL
HGB URINE DIPSTICK: NEGATIVE
Ketones, ur: NEGATIVE mg/dL
LEUKOCYTES UA: NEGATIVE
Nitrite: NEGATIVE
PROTEIN: NEGATIVE mg/dL
SPECIFIC GRAVITY, URINE: 1.006 (ref 1.005–1.030)
pH: 6 (ref 5.0–8.0)

## 2018-02-28 LAB — BASIC METABOLIC PANEL
Anion gap: 8 (ref 5–15)
BUN: 27 mg/dL — ABNORMAL HIGH (ref 8–23)
CHLORIDE: 102 mmol/L (ref 98–111)
CO2: 27 mmol/L (ref 22–32)
CREATININE: 1.08 mg/dL (ref 0.61–1.24)
Calcium: 9.1 mg/dL (ref 8.9–10.3)
GFR calc Af Amer: >60 mL/min (ref >60)
GFR calc non Af Amer: >60 mL/min (ref >60)
Glucose, Bld: 134 mg/dL — ABNORMAL HIGH (ref 70–99)
POTASSIUM: 4.4 mmol/L (ref 3.5–5.1)
Sodium: 137 mmol/L (ref 135–145)

## 2018-02-28 LAB — CBC
HEMATOCRIT: 41 % (ref 39.0–52.0)
HEMOGLOBIN: 13 g/dL (ref 13.0–17.0)
MCH: 32.1 pg (ref 26.0–34.0)
MCHC: 31.7 g/dL (ref 30.0–36.0)
MCV: 101.2 fL — ABNORMAL HIGH (ref 80.0–100.0)
NRBC: 0 % (ref 0.0–0.2)
Platelets: 218 10*3/uL (ref 150–400)
RBC: 4.05 MIL/uL — ABNORMAL LOW (ref 4.22–5.81)
RDW: 13.2 % (ref 11.5–15.5)
WBC: 4.4 10*3/uL (ref 4.0–10.5)

## 2018-02-28 LAB — I-STAT TROPONIN, ED: Troponin i, poc: 0 ng/mL (ref 0.00–0.08)

## 2018-02-28 MED ORDER — SODIUM CHLORIDE 0.9 % IV BOLUS
500.0000 mL | Freq: Once | INTRAVENOUS | Status: AC
  Administered 2018-02-28: 500 mL via INTRAVENOUS

## 2018-02-28 NOTE — ED Triage Notes (Signed)
Pt presents with weakness x months but today weaker than usual; pt poor historian; denies CP, SOB; pt sweating in triage

## 2018-02-28 NOTE — ED Notes (Signed)
Patient verbalizes understanding of discharge instructions. Opportunity for questioning and answers were provided. Armband removed by staff, pt discharged from ED in wheelchair.  

## 2018-02-28 NOTE — ED Provider Notes (Signed)
[facility] EMERGENCY DEPARTMENT Provider Note   CSN: [id number] Arrival date & time: 02/28/18  1730     History   Chief Complaint Chief Complaint  Patient presents with  . Weakness  . Excessive Sweating    HPI David Scott is a 77 y.o. male.  The history is provided by the patient. No language interpreter was used.  Weakness    David Scott is a 77 y.o. male who presents to the Emergency Department complaining of weakness. He presents the emergency department complaining of weakness that started today. He has experienced poor memory issues for the last 1 to 3 months. He states that he has been exercising and active lately but when he thought about getting on his exercise bike today he didn't think it was a good idea. He denies any headache, chest pain, shortness of breath, abdominal pain no focal numbness or weakness. He describes his symptoms as generalized. He does have a history of CVA. He does not have a history of hypertension. Past Medical History:  Diagnosis Date  . CVA (cerebral vascular accident) (HCC)   . Esophageal reflux     Patient Active Problem List   Diagnosis Date Noted  . Stroke (HCC) 04/12/2017  . Memory loss 02/03/2017  . Gait disturbance 02/03/2017  . Polyneuropathy 02/03/2017  . Numbness 02/03/2017    History reviewed. No pertinent surgical history.      Home Medications    Prior to Admission medications   Medication Sig Start Date End Date Taking? Authorizing Provider  albuterol (PROVENTIL HFA;VENTOLIN HFA) 108 (90 Base) MCG/ACT inhaler Inhale 2 puffs into the lungs every 6 (six) hours as needed for wheezing or shortness of breath.  09/09/17  Yes [provider]  aspirin 81 MG tablet Take 81 mg by mouth daily after lunch.    Yes [provider]  Cholecalciferol (VITAMIN D-3 PO) Take 1 capsule by mouth daily after lunch.    Yes [provider]  fish oil-omega-3 fatty acids 1000 MG capsule  Take 1 g by mouth daily.     Yes [provider]  Magnesium 250 MG TABS Take 250 mg by mouth daily.   Yes [provider]  Multiple Vitamin (MULTIVITAMIN) tablet Take 1 tablet by mouth 2 (two) times daily after a meal.    Yes [provider]  Probiotic CAPS Take 1 capsule by mouth daily.   Yes [provider]  Propylene Glycol (SYSTANE BALANCE) 0.6 % SOLN Place 1 drop into both eyes 3 (three) times daily as needed (for eye irritation or dryness).    Yes [provider]  vitamin C (ASCORBIC ACID) 500 MG tablet Take 1,000 mg by mouth daily.   Yes [provider]    Family History Family History  Problem Relation Age of Onset  . COPD Father     Social History Social History   Tobacco Use  . Smoking status: Never Smoker  . Smokeless tobacco: Never Used  Substance Use Topics  . Alcohol use: No  . Drug use: No     Allergies   Septra [sulfamethoxazole-trimethoprim]   Review of Systems Review of Systems  Neurological: Positive for weakness.  All other systems reviewed and are negative.    Physical Exam Updated Vital Signs BP (!) 182/71   Pulse (!) 54   Temp 98.5 F (36.9 C)   Resp 18   SpO2 100%   Physical Exam  Constitutional: He is oriented to person, place,  and time. He appears well-developed and well-nourished.  HENT:  Head: Normocephalic and atraumatic.  Cardiovascular: Normal rate and regular rhythm.  No murmur heard. Pulmonary/Chest: Effort normal and breath sounds normal. No respiratory distress.  Abdominal: Soft. There is no tenderness. There is no rebound and no guarding.  Musculoskeletal: He exhibits no edema or tenderness.  Neurological: He is alert and oriented to person, place, and time.  Five out of five strength in all four extremities with sensation to light touch intact in all four extremities. Visual fields are grossly intact. No pronator drift.  Skin: Skin is warm and dry.  Psychiatric: He has  a normal mood and affect. His behavior is normal.  Nursing note and vitals reviewed.    ED Treatments / Results  Labs (all labs ordered are listed, but only abnormal results are displayed) Labs Reviewed  BASIC METABOLIC PANEL - Abnormal; Notable for the following components:      Result Value   Glucose, Bld 134 (*)    BUN 27 (*)    All other components within normal limits  CBC - Abnormal; Notable for the following components:   RBC 4.05 (*)    MCV 101.2 (*)    All other components within normal limits  URINALYSIS, ROUTINE W REFLEX MICROSCOPIC - Abnormal; Notable for the following components:   Color, Urine STRAW (*)    All other components within normal limits  HEPATIC FUNCTION PANEL  I-STAT TROPONIN, ED    EKG EKG Interpretation  Date/Time:  Tuesday February 28 2018 17:55:07 EST Ventricular Rate:  54 PR Interval:  142 QRS Duration: 88 QT Interval:  414 QTC Calculation: 392 R Axis:   -38 Text Interpretation:  Sinus bradycardia Left axis deviation Anterior infarct , age undetermined Abnormal ECG Confirmed by Tilden Fossa [phone]) on 02/28/2018 6:56:11 PM   Radiology Dg Chest 2 View  Result Date: 02/28/2018 CLINICAL DATA:  77 y/o  M; months of weakness. EXAM: CHEST - 2 VIEW COMPARISON:  12/18/2016 chest radiograph FINDINGS: Stable heart size and mediastinal contours are within normal limits. Both lungs are clear. The visualized skeletal structures are unremarkable. IMPRESSION: No acute pulmonary process identified. Electronically Signed   By: Mitzi Hansen M.D.   On: 02/28/2018 19:41    Procedures Procedures (including critical care time)  Medications Ordered in ED Medications  sodium chloride 0.9 % bolus 500 mL (0 mLs Intravenous Stopped 02/28/18 2159)     Initial Impression / Assessment and Plan / ED Course  I have reviewed the triage vital signs and the nursing notes.  Pertinent labs & imaging results that were available during my care of the  patient were reviewed by me and considered in my medical decision making (see chart for details).     Patient here for evaluation of generalized weakness that began today. He has no focal weakness on examination. On ED evaluation after IV fluids he is feeling improved. He recently had outpatient imaging by his neurologist, has prior CVA. Current presentation is not consistent with recurrent CVA. Presentation is not consistent with hypertensive urgency, ACS, sepsis. Counseled patient on home care for weakness. Discussed outpatient follow-up, oral fluid hydration and return precautions.   Triage note states patient with excessive sweating. Patient denies any swelling and is not diaphoretic on ED evaluation. Final Clinical Impressions(s) / ED Diagnoses   Final diagnoses:  Asthenia    ED Discharge Orders    None       Tilden Fossa, MD 03/01/18 [phone]

## 2018-02-28 NOTE — ED Notes (Signed)
Patient transported to X-ray 

## 2018-02-28 NOTE — Discharge Instructions (Addendum)
The cause of your symptoms was not identified today.  Be sure to drink plenty of fluids and eat regular meals.  Get rechecked immediately if you develop any new or concerning symptoms.

## 2018-02-28 NOTE — ED Notes (Signed)
ED Provider at bedside. 

## 2018-02-28 NOTE — ED Notes (Signed)
Ambulated pt in hallway with steady gait, pt's stated he feels fine.

## 2018-03-21 ENCOUNTER — Other Ambulatory Visit: Payer: Self-pay

## 2018-03-21 ENCOUNTER — Encounter (HOSPITAL_COMMUNITY): Payer: Self-pay | Admitting: Emergency Medicine

## 2018-03-21 ENCOUNTER — Ambulatory Visit (HOSPITAL_COMMUNITY)
Admission: EM | Admit: 2018-03-21 | Discharge: 2018-03-21 | Disposition: A | Discharge status: Home / Self Care | Payer: Medicare Other | Attending: Family Medicine | Admitting: Family Medicine

## 2018-03-21 DIAGNOSIS — J111 Influenza due to unidentified influenza virus with other respiratory manifestations: Secondary | ICD-10-CM

## 2018-03-21 DIAGNOSIS — R062 Wheezing: Secondary | ICD-10-CM | POA: Diagnosis not present

## 2018-03-21 DIAGNOSIS — J22 Unspecified acute lower respiratory infection: Secondary | ICD-10-CM

## 2018-03-21 MED ORDER — ACETAMINOPHEN 325 MG PO TABS
650.0000 mg | ORAL_TABLET | Freq: Once | ORAL | Status: AC
  Administered 2018-03-21: 650 mg via ORAL

## 2018-03-21 MED ORDER — PREDNISONE 20 MG PO TABS: 20.0000 mg | ORAL_TABLET | Freq: Two times a day (BID) | ORAL | 0 refills | Status: DC

## 2018-03-21 MED ORDER — ACETAMINOPHEN 325 MG PO TABS
ORAL_TABLET | ORAL | Status: AC
  Filled 2018-03-21: qty 2

## 2018-03-21 MED ORDER — ACETAMINOPHEN 325 MG PO TABS
ORAL_TABLET | ORAL | Status: AC
  Filled 2018-03-21: qty 1

## 2018-03-21 MED ORDER — IPRATROPIUM-ALBUTEROL 20-100 MCG/ACT IN AERS: 1.0000 | INHALATION_SPRAY | Freq: Four times a day (QID) | RESPIRATORY_TRACT | 0 refills | Status: DC

## 2018-03-21 MED ORDER — BENZONATATE 100 MG PO CAPS: 100.0000 mg | ORAL_CAPSULE | Freq: Three times a day (TID) | ORAL | 0 refills | Status: DC

## 2018-03-21 NOTE — ED Triage Notes (Signed)
Complains of cough that started yesterday.  Patient has a runny nose.

## 2018-03-21 NOTE — Discharge Instructions (Signed)
Drink plenty of water Take the prednisone 2 x a day for 5 days Use the new inhaler every 4-6 hours Take tessalon pills 2 - 3 times a day for cough See your PCP if not improving in a few days

## 2018-03-21 NOTE — ED Provider Notes (Signed)
[facility]    CSN: [id number] Arrival date & time: 03/21/18  1544     History   Chief Complaint Chief Complaint  Patient presents with  . URI    HPI David Scott is a 77 y.o. male.   HPI  Patient is here for an upper respiratory infection.  He is here alone.  He has a very hesitant speech pattern.  He has trouble remembering words.  His own history does have memory problems.  He has suffered a stroke.  History, therefore, is challenging.  He states he has had a cough and cold for several days.  It got worse yesterday.  He states he is wheezing.  He is short of breath.  He is using an albuterol inhaler.  Feels like he is using it "too much".  He is coughing up sputum.  He has had a fever.  He feels very tired.  His chest hurts when he coughs.  No runny or stuffy nose.  No headache.  No ear pain or pressure.  No nausea or vomiting.  Past Medical History:  Diagnosis Date  . CVA (cerebral vascular accident) (HCC)   . Esophageal reflux     Patient Active Problem List   Diagnosis Date Noted  . Stroke (HCC) 04/12/2017  . Memory loss 02/03/2017  . Gait disturbance 02/03/2017  . Polyneuropathy 02/03/2017  . Numbness 02/03/2017    History reviewed. No pertinent surgical history.     Home Medications    Prior to Admission medications   Medication Sig Start Date End Date Taking? Authorizing Provider  albuterol (PROVENTIL HFA;VENTOLIN HFA) 108 (90 Base) MCG/ACT inhaler Inhale 2 puffs into the lungs every 6 (six) hours as needed for wheezing or shortness of breath.  09/09/17  Yes [provider]  NON FORMULARY    Yes [provider]  aspirin 81 MG tablet Take 81 mg by mouth daily after lunch.     [provider]  benzonatate (TESSALON) 100 MG capsule Take 1 capsule (100 mg total) by mouth every 8 (eight) hours. 03/21/18   Eustace Moore, MD  Cholecalciferol (VITAMIN D-3 PO) Take 1 capsule by mouth daily after lunch.     [provider]  fish oil-omega-3 fatty acids 1000 MG capsule Take 1 g by mouth daily.      [provider]  Ipratropium-Albuterol (COMBIVENT) 20-100 MCG/ACT AERS respimat Inhale 1 puff into the lungs every 6 (six) hours. 03/21/18   Eustace Moore, MD  Magnesium 250 MG TABS Take 250 mg by mouth daily.    [provider]  Multiple Vitamin (MULTIVITAMIN) tablet Take 1 tablet by mouth 2 (two) times daily after a meal.     [provider]  predniSONE (DELTASONE) 20 MG tablet Take 1 tablet (20 mg total) by mouth 2 (two) times daily with a meal. 03/21/18   Eustace Moore, MD  Probiotic CAPS Take 1 capsule by mouth daily.    [provider]  Propylene Glycol (SYSTANE BALANCE) 0.6 % SOLN Place 1 drop into both eyes 3 (three) times daily as needed (for eye irritation or dryness).     [provider]  vitamin C (ASCORBIC ACID) 500 MG tablet Take 1,000 mg by mouth daily.    [provider]    Family History Family History  Problem Relation Age of Onset  . COPD Father     Social History Social History   Tobacco Use  . Smoking status: Never  Smoker  . Smokeless tobacco: Never Used  Substance Use Topics  . Alcohol use: No  . Drug use: No     Allergies   Septra [sulfamethoxazole-trimethoprim]   Review of Systems Review of Systems  Constitutional: Positive for activity change and fatigue. Negative for chills and fever.  HENT: Positive for congestion. Negative for ear pain and sore throat.   Eyes: Negative for pain and visual disturbance.  Respiratory: Positive for cough, shortness of breath and wheezing.   Cardiovascular: Negative for chest pain and palpitations.  Gastrointestinal: Negative for abdominal pain and vomiting.  Genitourinary: Negative for dysuria and hematuria.  Musculoskeletal: Negative for arthralgias and back pain.  Skin: Negative for color change and rash.  Neurological: Negative for seizures and syncope.    Psychiatric/Behavioral: Positive for sleep disturbance.  All other systems reviewed and are negative.    Physical Exam Triage Vital Signs ED Triage Vitals  Enc Vitals Group     BP 03/21/18 1707 (!) 143/67     Pulse Rate 03/21/18 1707 83     Resp 03/21/18 1707 (!) 22     Temp 03/21/18 1707 (!) 100.5 F (38.1 C)     Temp Source 03/21/18 1707 Oral     SpO2 03/21/18 1707 100 %   No data found.  Updated Vital Signs BP (!) 143/67 (BP Location: Left Arm)   Pulse 83   Temp (!) 100.5 F (38.1 C) (Oral)   Resp (!) 22   SpO2 100%   Physical Exam  Constitutional: He appears well-developed and well-nourished.  Small steps.  Stooped posture.  Smells of urine.  Front of trousers damp.  Speech is hesitant  HENT:  Head: Normocephalic and atraumatic.  Mouth/Throat: Oropharynx is clear and moist.  Oropharynx slightly dry.  No erythema or discharge.  Needs dental repair.  Both TMs obscured by cerumen  Eyes: Pupils are equal, round, and reactive to light. Conjunctivae are normal.  Neck: Normal range of motion.  Cardiovascular: Normal rate, regular rhythm and normal heart sounds.  Pulmonary/Chest: Effort normal. No respiratory distress. He has wheezes.  Few anterior rhonchi.  Scattered inspiratory wheeze throughout.  Abdominal: Soft. He exhibits no distension.  Musculoskeletal: Normal range of motion. He exhibits no edema.  Lymphadenopathy:    He has no cervical adenopathy.  Neurological: He is alert.  Skin: Skin is warm and dry.     UC Treatments / Results  Labs (all labs ordered are listed, but only abnormal results are displayed) Labs Reviewed - No data to display  EKG None  Radiology No results found.  Procedures Procedures (including critical care time)  Medications Ordered in UC Medications  acetaminophen (TYLENOL) tablet 650 mg (650 mg Oral Given 03/21/18 1718)    Initial Impression / Assessment and Plan / UC Course  I have reviewed the triage vital signs and  the nursing notes.  Pertinent labs & imaging results that were available during my care of the patient were reviewed by me and considered in my medical decision making (see chart for details).     Patient has a viral upper respiratory infection.  He is wheezing.He does not have a history of asthma or COPD.  I am uncertain why he was prescribed an albuterol inhaler.  He states he does not know why, it was given to him previously by his PCP.  He states he is using the albuterol ""too much".  He would like something stronger.  Is living with family.  He lives with his son  who states will help supervise his care. Final Clinical Impressions(s) / UC Diagnoses   Final diagnoses:  Lower respiratory infection (e.g., bronchitis, pneumonia, pneumonitis, pulmonitis)  Inspiratory wheeze on examination  Influenza     Discharge Instructions     Drink plenty of water Take the prednisone 2 x a day for 5 days Use the new inhaler every 4-6 hours Take tessalon pills 2 - 3 times a day for cough See your PCP if not improving in a few days   ED Prescriptions    Medication Sig Dispense Auth. Provider   Ipratropium-Albuterol (COMBIVENT) 20-100 MCG/ACT AERS respimat Inhale 1 puff into the lungs every 6 (six) hours. 1 Inhaler Eustace MooreNelson, Epimenio Schetter Sue, MD   predniSONE (DELTASONE) 20 MG tablet Take 1 tablet (20 mg total) by mouth 2 (two) times daily with a meal. 10 tablet Eustace MooreNelson, Velda Wendt Sue, MD   benzonatate (TESSALON) 100 MG capsule Take 1 capsule (100 mg total) by mouth every 8 (eight) hours. 21 capsule Eustace MooreNelson, Sadarius Norman Sue, MD     Controlled Substance Prescriptions [place] Controlled Substance Registry consulted? Not Applicable   Eustace MooreNelson, Dajai Wahlert Sue, MD 03/21/18 1819

## 2018-03-28 DIAGNOSIS — R0602 Shortness of breath: Secondary | ICD-10-CM | POA: Diagnosis not present

## 2018-03-28 DIAGNOSIS — J45909 Unspecified asthma, uncomplicated: Secondary | ICD-10-CM | POA: Diagnosis not present

## 2018-03-28 DIAGNOSIS — J209 Acute bronchitis, unspecified: Secondary | ICD-10-CM | POA: Diagnosis not present

## 2018-03-29 DIAGNOSIS — J45909 Unspecified asthma, uncomplicated: Secondary | ICD-10-CM | POA: Diagnosis not present

## 2018-03-29 DIAGNOSIS — J209 Acute bronchitis, unspecified: Secondary | ICD-10-CM | POA: Diagnosis not present

## 2018-04-03 DIAGNOSIS — J45909 Unspecified asthma, uncomplicated: Secondary | ICD-10-CM | POA: Diagnosis not present

## 2018-04-03 DIAGNOSIS — R05 Cough: Secondary | ICD-10-CM | POA: Diagnosis not present

## 2018-04-03 DIAGNOSIS — J209 Acute bronchitis, unspecified: Secondary | ICD-10-CM | POA: Diagnosis not present

## 2018-04-04 ENCOUNTER — Encounter (HOSPITAL_COMMUNITY): Payer: Self-pay

## 2018-04-04 ENCOUNTER — Ambulatory Visit (HOSPITAL_COMMUNITY)
Admission: EM | Admit: 2018-04-04 | Discharge: 2018-04-04 | Disposition: A | Discharge status: Home / Self Care | Payer: Medicare Other | Attending: Family Medicine | Admitting: Family Medicine

## 2018-04-04 DIAGNOSIS — R0602 Shortness of breath: Secondary | ICD-10-CM | POA: Diagnosis not present

## 2018-04-04 MED ORDER — AZITHROMYCIN 250 MG PO TABS: 250.0000 mg | ORAL_TABLET | Freq: Every day | ORAL | 0 refills | Status: DC

## 2018-04-04 NOTE — ED Provider Notes (Signed)
[facility]    CSN: [id number] Arrival date & time: 04/04/18  1718     History   Chief Complaint Chief Complaint  Patient presents with  . Shortness of Breath    HPI David Scott is a 77 y.o. male.   HPI  Patient was seen 3 weeks ago.  He complained of difficulty breathing.  That note is reviewed.  He was seen by me.  He is here today requesting a Z-Pak.  He states when he has trouble breathing in the past this is helped him.  At his last visit I gave him an inhaler and some prednisone.  I felt he was short of breath from viral URI.  He denied any COPD or asthma.  The son reports that he thinks he slightly better.  Father reports that he is still having shortness of breath and thinks he needs an antibiotic.  No fever.  No sputum.  No chest pain.  Dyspnea is with exertion and at rest. Again, history is difficult because of patient's short-term memory impairment and hesitant speech.  He acts as if he has to analyze every simple question, pause, and then gives answers.  Sometimes incorrect.  For example I asked when his last chest x-ray was and he said "last June".  I reviewed the chart and he did have 1 in November.  Review the chest x-ray and EKG from November and they were unremarkable. Past Medical History:  Diagnosis Date  . CVA (cerebral vascular accident) (HCC)   . Esophageal reflux     Patient Active Problem List   Diagnosis Date Noted  . Stroke (HCC) 04/12/2017  . Memory loss 02/03/2017  . Gait disturbance 02/03/2017  . Polyneuropathy 02/03/2017  . Numbness 02/03/2017    History reviewed. No pertinent surgical history.     Home Medications    Prior to Admission medications   Medication Sig Start Date End Date Taking? Authorizing Provider  albuterol (PROVENTIL HFA;VENTOLIN HFA) 108 (90 Base) MCG/ACT inhaler Inhale 2 puffs into the lungs every 6 (six) hours as needed for wheezing or shortness of breath.  09/09/17   [provider]  aspirin 81  MG tablet Take 81 mg by mouth daily after lunch.     [provider]  azithromycin (ZITHROMAX) 250 MG tablet Take 1 tablet (250 mg total) by mouth daily. Take first 2 tablets together, then 1 every day until finished. 04/04/18   Eustace MooreNelson, Asako Saliba Sue, MD  benzonatate (TESSALON) 100 MG capsule Take 1 capsule (100 mg total) by mouth every 8 (eight) hours. 03/21/18   Eustace MooreNelson, Nakyra Bourn Sue, MD  Cholecalciferol (VITAMIN D-3 PO) Take 1 capsule by mouth daily after lunch.     [provider]  fish oil-omega-3 fatty acids 1000 MG capsule Take 1 g by mouth daily.      [provider]  Ipratropium-Albuterol (COMBIVENT) 20-100 MCG/ACT AERS respimat Inhale 1 puff into the lungs every 6 (six) hours. 03/21/18   Eustace MooreNelson, Edsel Shives Sue, MD  Magnesium 250 MG TABS Take 250 mg by mouth daily.    [provider]  Multiple Vitamin (MULTIVITAMIN) tablet Take 1 tablet by mouth 2 (two) times daily after a meal.     [provider]  NON FORMULARY     [provider]  predniSONE (DELTASONE) 20 MG tablet Take 1 tablet (20 mg total) by mouth 2 (two) times daily with a meal. 03/21/18   Eustace MooreNelson, Natasha Burda Sue, MD  Probiotic CAPS Take 1 capsule by  mouth daily.    [provider]  Propylene Glycol (SYSTANE BALANCE) 0.6 % SOLN Place 1 drop into both eyes 3 (three) times daily as needed (for eye irritation or dryness).     [provider]  vitamin C (ASCORBIC ACID) 500 MG tablet Take 1,000 mg by mouth daily.    [provider]    Family History Family History  Problem Relation Age of Onset  . COPD Father     Social History Social History   Tobacco Use  . Smoking status: Never Smoker  . Smokeless tobacco: Never Used  Substance Use Topics  . Alcohol use: No  . Drug use: No     Allergies   Septra [sulfamethoxazole-trimethoprim]   Review of Systems Review of Systems  Constitutional: Negative for chills and fever.  HENT: Negative for congestion, ear  pain and sore throat.   Eyes: Negative for pain and visual disturbance.  Respiratory: Positive for cough and shortness of breath. Negative for wheezing.   Cardiovascular: Negative for chest pain and palpitations.  Gastrointestinal: Negative for abdominal pain and vomiting.  Genitourinary: Negative for dysuria and hematuria.  Musculoskeletal: Positive for gait problem. Negative for arthralgias and back pain.  Skin: Negative for color change and rash.  Neurological: Positive for weakness. Negative for seizures and syncope.  Psychiatric/Behavioral: Positive for confusion.  All other systems reviewed and are negative.    Physical Exam Triage Vital Signs ED Triage Vitals  Enc Vitals Group     BP 04/04/18 1803 138/88     Pulse Rate 04/04/18 1803 88     Resp 04/04/18 1803 18     Temp 04/04/18 1803 98.7 F (37.1 C)     Temp Source 04/04/18 1803 Oral     SpO2 04/04/18 1803 100 %   No data found.  Updated Vital Signs BP 138/88 (BP Location: Right Arm)   Pulse 88   Temp 98.7 F (37.1 C) (Oral)   Resp 18   SpO2 100%      Physical Exam  Constitutional: He appears well-developed and well-nourished. He appears ill.  Thin elderly man.  Slightly disheveled.  Hesitant speech.  Poor eye contact.  Little facial expression.  In wheelchair  HENT:  Head: Normocephalic and atraumatic.  Mouth/Throat: Oropharynx is clear and moist.  Eyes: Pupils are equal, round, and reactive to light.  Neck: Normal range of motion.  Cardiovascular: Normal rate and regular rhythm.  Frequent ectopy  Pulmonary/Chest: Effort normal. No respiratory distress. He has decreased breath sounds. He exhibits no mass and no tenderness.  Decreased air excursion.  Decreased breath sounds.  No wheeze, no rhonchi, no rales  Abdominal: Soft.  Examined in chair  Musculoskeletal:       Right lower leg: He exhibits no edema.       Left lower leg: He exhibits no edema.  Lymphadenopathy:    He has cervical adenopathy.    Neurological: He is alert.  Skin: Skin is warm and dry.  Psychiatric:  Little expression.  Slow speech     UC Treatments / Results  Labs (all labs ordered are listed, but only abnormal results are displayed) Labs Reviewed - No data to display  EKG None  Radiology No results found.  Procedures Procedures (including critical care time)  Medications Ordered in UC Medications - No data to display  Initial Impression / Assessment and Plan / UC Course  I have reviewed the triage vital signs and the nursing notes.  Pertinent labs &  imaging results that were available during my care of the patient were reviewed by me and considered in my medical decision making (see chart for details).     Although the indication for antibiotics is soft, he has had respiratory symptoms for 3 weeks.  No sputum.  No fever.  He is still symptomatic. Final Clinical Impressions(s) / UC Diagnoses   Final diagnoses:  Shortness of breath     Discharge Instructions     Drink plenty of fluid Take the z pak as instructed Use inhaler if needed for wheezing See your primary care doctor in follow up   ED Prescriptions    Medication Sig Dispense Auth. Provider   azithromycin (ZITHROMAX) 250 MG tablet Take 1 tablet (250 mg total) by mouth daily. Take first 2 tablets together, then 1 every day until finished. 6 tablet Eustace Moore, MD     Controlled Substance Prescriptions [place] Controlled Substance Registry consulted? Not Applicable   Eustace Moore, MD 04/04/18 [phone]

## 2018-04-04 NOTE — ED Triage Notes (Addendum)
Pt is complaining of difficulty breathing.  

## 2018-04-04 NOTE — Discharge Instructions (Signed)
Drink plenty of fluid Take the z pak as instructed Use inhaler if needed for wheezing See your primary care doctor in follow up

## 2018-04-13 ENCOUNTER — Ambulatory Visit: Payer: Medicare Other | Admitting: Neurology

## 2018-04-18 DIAGNOSIS — J449 Chronic obstructive pulmonary disease, unspecified: Secondary | ICD-10-CM | POA: Diagnosis not present

## 2018-04-24 DIAGNOSIS — J449 Chronic obstructive pulmonary disease, unspecified: Secondary | ICD-10-CM | POA: Diagnosis not present

## 2018-04-24 DIAGNOSIS — B356 Tinea cruris: Secondary | ICD-10-CM | POA: Diagnosis not present

## 2018-05-10 ENCOUNTER — Other Ambulatory Visit: Payer: Self-pay

## 2018-05-10 ENCOUNTER — Encounter: Payer: Self-pay | Admitting: Neurology

## 2018-05-10 ENCOUNTER — Ambulatory Visit (INDEPENDENT_AMBULATORY_CARE_PROVIDER_SITE_OTHER): Payer: Medicare Other | Admitting: Neurology

## 2018-05-10 VITALS — BP 170/72 | HR 51 | Resp 16 | Ht 70.0 in | Wt 129.0 lb

## 2018-05-10 DIAGNOSIS — R269 Unspecified abnormalities of gait and mobility: Secondary | ICD-10-CM

## 2018-05-10 DIAGNOSIS — I639 Cerebral infarction, unspecified: Secondary | ICD-10-CM

## 2018-05-10 DIAGNOSIS — R413 Other amnesia: Secondary | ICD-10-CM | POA: Diagnosis not present

## 2018-05-10 DIAGNOSIS — G231 Progressive supranuclear ophthalmoplegia [Steele-Richardson-Olszewski]: Secondary | ICD-10-CM

## 2018-05-10 MED ORDER — DONEPEZIL HCL 5 MG PO TABS: 5.0000 mg | ORAL_TABLET | Freq: Every day | ORAL | 11 refills | Status: DC

## 2018-05-10 NOTE — Progress Notes (Signed)
[facility]  PATIENT: David Scott DOB: 1940/05/09  REFERRING DOCTOR OR PCP:  David Else, MD SOURCE: patient, notes from the ED, imaging and lab report, CT head images on PACS.  _________________________________   HISTORICAL  CHIEF COMPLAINT:  Chief Complaint  Patient presents with  . Cerebrovascular Accident    Rm. 13. Denies new stroke sx. Ambulatory with shuffeling gait, without assistance (discussed cane at last ov)  Denies any falls over the last month./fim  . Gait Disturbance    HISTORY OF PRESENT ILLNESS:  David Scott is a 78 y.o. man with gait disturbance and h/o stroke  Update 05/10/2018: After the last visit, due to worsening gait and head discomfort, he had a CT scan.  It showed remote left frontal encephalomalacia consistent with his previous stroke.  He also had a remote small right cerebellar hemisphere lacunar infarction and some chronic microvascular ischemic changes.  The CT looked unchanged compared to the 11/03/2017 CT scan.   The cerebellar stroke was acute in 2018.  He continues to report difficulties with his gait.  He denies any falls since the last visit.  We had discussed using a cane but he is walking without one.   No temor.     He denies any new headache and the head discomfort is better.    He notes reduced upgaze.   No change in speech.     He has had more difficulty with focus short term memory in general.      Update 02/13/2018: He walks in today, unscheduled, stating he has not been feeling good.  He feels like there is a weight on his head.   This is not painful but feels different.   He denies any new change in gait though gait has ben worse this year.    He denies any new numbness or weakness.   He denies recent change in bladder function though has some urgency at times, worse the past few years.     He notes some mild memory difficulties but feels that this is stable and unchanged since the last visit.  MRI  cervical spine 11/14/2017 (performed due to worsening gait) showed multilevel DJD with mild spinal stenosis at C2C3 and C3C4 and borderline spinal stenosis at C5C6.     MRI 02/23/2017 had shown a small acute on chronic stroke in the right PICA distribution of the cerebellum.    He has left inferior chronic post traumatic encephalomalacia  Update 11/07/2017: He went to the ED last week because he was having a LUQ abdominal pain and also noted his walking was doing worse.   He has no recent falls.    In the ED, labs were c/w mild dehydration.   Imaging and lab results were reviewed.   I personally reviewed the CT scan he had in the ED and it shows the old frontal encephalomalacia but no acute findings.    Abdominal pain is better.   Gait uis the same as last year.     Late last year, an MRI showed a very small acute right cerebellar CVA and a chronic left frontal encephalomalacia.   He is on ASA prophylaxis.   He has had an Echo with bubble contrast and it was ok except mild LVH.    Carotid U/S was normal for age.      Update 04/12/2017: Since his last visit, he has had an MRI of the brain (personally reviewed today) that did show a small acute cerebellar  stroke and there was also some encephalomalacia in the left frontal lobe (also present on 2017 CT scan). He feels he is doing better since his last visit. He has been taking the aspirin 81 mg daily as ordered. An echocardiogram with bubble contrast did not show any significant problem (some LVH).    Carotid U/S has not been done.  He feels memory is doing about the same.    Sometimes he feels depression but not daily.    Montreal Cognitive Assessment  04/12/2017  Visuospatial/ Executive (0/5) 4  Naming (0/3) 3  Attention: Read list of digits (0/2) 2  Attention: Read list of letters (0/1) 1  Attention: Serial 7 subtraction starting at 100 (0/3) 3  Language: Repeat phrase (0/2) 2  Language : Fluency (0/1) 0  Abstraction (0/2) 2  Delayed Recall (0/5)  3  Orientation (0/6) 6  Total 26  Adjusted Score (based on education) 26      From 02/03/2017: On 01/28/2017, he presented to the emergency room feeling confused and disoriented that morning he felt fine when he went to bed the previous night.   David Scott does not remember that day.   His son was with him and noted that he was fine physically but did not remember things he should like where his money was.    He presented to the emergency room at [facility] and felt fine by the time he was evaluated without any new complaints. A CT scan was performed. I personally reviewed the images. There were no acute findings and an old left inferior frontal lobe stroke.   CBC and CMP were essentially normal. There was no evidence of UTI.   He was felt to be back to baseline according to his family and was discharged without admission.   However, his some still feels his STM is not completely back to baseline.  He hasn't felt comfortable enough to return back to driving     He reports sleeping well most nights.   He falls asleep quick but will occasionally wake up.   He does not have noticeable snoring.   He denies actual depression .  However he is more apathetic.  No significant weight loss (3 pound loss in past year) but he feels decreased appetite.   His son does not feel he's depressed.    He spends his typical day going out ofr breakfast ([employer] often) and drives around to do some chores.     He denies any change in gait or strength.     I personally reviewed the CT scan dated 01/28/2017 and compared to CT scan dated 08/09/2015. He has a small old infarct in the anterior inferior left frontal lobe present on both scans. There is mild atrophy that could be appropriate for age. There were no acute findings.  REVIEW OF SYSTEMS: Constitutional: No fevers, chills, sweats, or change in appetite Eyes: No visual changes, double vision, eye pain Ear, nose and throat: No hearing loss, ear pain, nasal congestion,  sore throat Cardiovascular: No chest pain, palpitations Respiratory: No shortness of breath at rest or with exertion.   He takes ventolin at night.   GastrointestinaI: No nausea, vomiting, diarrhea, abdominal pain, fecal incontinence Genitourinary: No dysuria, urinary retention or frequency.  No nocturia. Musculoskeletal: No neck pain, back pain Integumentary: No rash, pruritus, skin lesions Neurological: as above Psychiatric: No depression at this time.  No anxiety Endocrine: No palpitations, diaphoresis, change in appetite, change in weigh or increased thirst Hematologic/Lymphatic: No  anemia, purpura, petechiae. Allergic/Immunologic: No itchy/runny eyes, nasal congestion, recent allergic reactions, rashes  ALLERGIES: Allergies  Allergen Reactions  . Septra [Sulfamethoxazole-Trimethoprim] Rash    Pt states MD had him taking Sulfa by the handful when he had prostate infection, says they don't know what caused the rash    HOME MEDICATIONS:  Current Outpatient Medications:  .  albuterol (PROVENTIL HFA;VENTOLIN HFA) 108 (90 Base) MCG/ACT inhaler, Inhale 2 puffs into the lungs every 6 (six) hours as needed for wheezing or shortness of breath. , Disp: , Rfl:  .  aspirin 81 MG tablet, Take 81 mg by mouth daily after lunch. , Disp: , Rfl:  .  Cholecalciferol (VITAMIN D-3 PO), Take 1 capsule by mouth daily after lunch. , Disp: , Rfl:  .  fish oil-omega-3 fatty acids 1000 MG capsule, Take 1 g by mouth daily.  , Disp: , Rfl:  .  Ipratropium-Albuterol (COMBIVENT) 20-100 MCG/ACT AERS respimat, Inhale 1 puff into the lungs every 6 (six) hours., Disp: 1 Inhaler, Rfl: 0 .  Magnesium 250 MG TABS, Take 250 mg by mouth daily., Disp: , Rfl:  .  Multiple Vitamin (MULTIVITAMIN) tablet, Take 1 tablet by mouth 2 (two) times daily after a meal. , Disp: , Rfl:  .  NON FORMULARY, , Disp: , Rfl:  .  Probiotic CAPS, Take 1 capsule by mouth daily., Disp: , Rfl:  .  Propylene Glycol (SYSTANE BALANCE) 0.6 %  SOLN, Place 1 drop into both eyes 3 (three) times daily as needed (for eye irritation or dryness). , Disp: , Rfl:  .  vitamin C (ASCORBIC ACID) 500 MG tablet, Take 1,000 mg by mouth daily., Disp: , Rfl:  .  donepezil (ARICEPT) 5 MG tablet, Take 1 tablet (5 mg total) by mouth at bedtime., Disp: 30 tablet, Rfl: 11 .  predniSONE (DELTASONE) 20 MG tablet, Take 1 tablet (20 mg total) by mouth 2 (two) times daily with a meal. (Patient not taking: Reported on 05/10/2018), Disp: 10 tablet, Rfl: 0  PAST MEDICAL HISTORY: Past Medical History:  Diagnosis Date  . CVA (cerebral vascular accident) (HCC)   . Esophageal reflux     PAST SURGICAL HISTORY: History reviewed. No pertinent surgical history.  FAMILY HISTORY: Family History  Problem Relation Age of Onset  . COPD Father     SOCIAL HISTORY:  Social History   Socioeconomic History  . Marital status: Single    Spouse name: Not on file  . Number of children: Not on file  . Years of education: Not on file  . Highest education level: Not on file  Occupational History  . Not on file  Social Needs  . Financial resource strain: Not on file  . Food insecurity:    Worry: Not on file    Inability: Not on file  . Transportation needs:    Medical: Not on file    Non-medical: Not on file  Tobacco Use  . Smoking status: Never Smoker  . Smokeless tobacco: Never Used  Substance and Sexual Activity  . Alcohol use: No  . Drug use: No  . Sexual activity: Not on file  Lifestyle  . Physical activity:    Days per week: Not on file    Minutes per session: Not on file  . Stress: Not on file  Relationships  . Social connections:    Talks on phone: Not on file    Gets together: Not on file    Attends religious service: Not on file  Active member of club or organization: Not on file    Attends meetings of clubs or organizations: Not on file    Relationship status: Not on file  . Intimate partner violence:    Fear of current or ex partner:  Not on file    Emotionally abused: Not on file    Physically abused: Not on file    Forced sexual activity: Not on file  Other Topics Concern  . Not on file  Social History Narrative  . Not on file     PHYSICAL EXAM  Vitals:   05/10/18 1533  BP: (!) 170/72  Pulse: (!) 51  Resp: 16  Weight: 129 lb (58.5 kg)  Height: 5\' 10"  (1.778 m)    Body mass index is 18.51 kg/m.   General: The patient is well-developed and well-nourished and in no acute distress.    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     Neurologic Exam  Mental status: The patient is alert and oriented x 3 at the time of the examination. The patient has reduced STM (1/3 without prompt and 2/3 with prompt), with a reduced attention span (100-93-86-?).   Speech is normal.  Cranial nerves: No volitional upgaze (supranuclear as oculocephalic movements allow some upgaze)    . Facial strength and sensation are normal. Trapezius and sternocleidomastoid strength is normal. No dysarthria is noted.  The tongue is midline, and the patient has symmetric elevation of the soft palate. No obvious hearing deficits are noted.  Motor:  He is mildly bradykinetic but there is no tremor and muscle tone is normal.  Muscle bulk is normal. Strength is  5 / 5 in all 4 extremities.   Sensory: He has intact sensation to touch and vibration in the arms. There is reduced vibration in the toes and ankles (about 50%).       Coordination: Cerebellar testing reveals good finger-nose-finger and heel-to-shin bilaterally.  Gait and station: Station is normal.  His gait has a reduced stride and he looks down as he walks. He turns 180 degrees in 3-4 steps.   He has retropulsion with disturbance of station   Reflexes: Deep tendon reflexes are 2 and symmetric in the arms and legs.      DIAGNOSTIC DATA (LABS, IMAGING, TESTING) - I reviewed patient records, labs, notes, testing and imaging myself where available.  Lab Results  Component Value Date    WBC 4.4 02/28/2018   HGB 13.0 02/28/2018   HCT 41.0 02/28/2018   MCV 101.2 (H) 02/28/2018   PLT 218 02/28/2018      Component Value Date/Time   NA 137 02/28/2018 1759   K 4.4 02/28/2018 1759   CL 102 02/28/2018 1759   CO2 27 02/28/2018 1759   GLUCOSE 134 (H) 02/28/2018 1759   BUN 27 (H) 02/28/2018 1759   CREATININE 1.08 02/28/2018 1759   CALCIUM 9.1 02/28/2018 1759   PROT 6.6 11/03/2017 1726   PROT 6.3 02/03/2017 1037   ALBUMIN 4.0 11/03/2017 1726   AST 24 11/03/2017 1726   ALT 18 11/03/2017 1726   ALKPHOS 68 11/03/2017 1726   BILITOT 0.7 11/03/2017 1726   GFRNONAA >60 02/28/2018 1759   GFRAA >60 02/28/2018 1759       ASSESSMENT AND PLAN  Cerebrovascular accident (CVA), unspecified mechanism (HCC)  Memory loss  Gait disturbance  Supranuclear palsy (HCC)   1.   We discussed that gait issues, mild memory issues and decreased upgaze could be part of a neurodegenerative process (  ie PSP or other parkinsonian).  Exam not consistent with idiopathic (common) Parkinson's.    2.   Donepezil 5 mg, increase further if well tolerated.   3.   We discussed considering using a cane for reduced balance 4.   rtc in 6 months or sooner if new or worsening neurologic problems.  Can see NP next visit (alternating with me)  Deserie Dirks A. Epimenio FootSater, MD, [facility],FAAN 05/10/2018, 4:59 PM Certified in Neurology, Clinical Neurophysiology, Sleep Medicine, Pain Medicine and Neuroimaging  [facility] [address], [address] [place], [place] [address] [phone]

## 2018-08-05 ENCOUNTER — Other Ambulatory Visit: Payer: Self-pay

## 2018-08-05 ENCOUNTER — Ambulatory Visit (HOSPITAL_COMMUNITY)
Admission: EM | Admit: 2018-08-05 | Discharge: 2018-08-05 | Disposition: A | Discharge status: Home / Self Care | Payer: Medicare Other | Attending: Physician Assistant | Admitting: Physician Assistant

## 2018-08-05 ENCOUNTER — Encounter (HOSPITAL_COMMUNITY): Payer: Self-pay

## 2018-08-05 DIAGNOSIS — F341 Dysthymic disorder: Secondary | ICD-10-CM

## 2018-08-05 DIAGNOSIS — R5381 Other malaise: Secondary | ICD-10-CM

## 2018-08-05 DIAGNOSIS — R5383 Other fatigue: Secondary | ICD-10-CM

## 2018-08-05 LAB — POCT URINALYSIS DIP (DEVICE)
Bilirubin Urine: NEGATIVE
Glucose, UA: NEGATIVE mg/dL
Hgb urine dipstick: NEGATIVE
Ketones, ur: NEGATIVE mg/dL
Leukocytes,Ua: NEGATIVE
Nitrite: NEGATIVE
Protein, ur: NEGATIVE mg/dL
Specific Gravity, Urine: >=1.03 (ref 1.005–1.030)
Urobilinogen, UA: 0.2 mg/dL (ref 0.0–1.0)
pH: 5.5 (ref 5.0–8.0)

## 2018-08-05 NOTE — Discharge Instructions (Addendum)
Your EKG is normal relative to previous tracings.  Your urine is perfect aside from some mild dehydration.  Please drink more fluids.  Follow up with Dr. Azucena Kuba next week.

## 2018-08-05 NOTE — ED Triage Notes (Signed)
Patient presents to Urgent Care with complaints of generalized weakness since "a while now, but worse yesterday". Patient states he has not had a fever, shortness of breath, or cough. He is just weak.

## 2018-08-05 NOTE — ED Notes (Signed)
EKG shown to M. Chestine Spore, PA.  Pt attempting to provide urine sample.

## 2018-08-05 NOTE — ED Provider Notes (Signed)
08/05/2018 12:08 PM   DOB: 07/23/1940 / MRN: [id number]  SUBJECTIVE:  David Scott is a 78 y.o. male presenting for generalized fatigue that is chronic but worsened yesterday and prompted him to come in for evaluation. He is a very poor historian. Takes not medications. See ROS, but largely pan negative physically.  Interestingly he ask me, "do you think it could be a mental problem."  He is allergic to septra [sulfamethoxazole-trimethoprim].   He  has a past medical history of CVA (cerebral vascular accident) (HCC) and Esophageal reflux.    He  reports that he has never smoked. He has never used smokeless tobacco. He reports that he does not drink alcohol or use drugs. He  has no history on file for sexual activity. The patient  has no past surgical history on file.  His family history includes COPD in his father.  Review of Systems  Constitutional: Positive for malaise/fatigue. Negative for chills, diaphoresis, fever and weight loss.  Eyes: Negative.   Respiratory: Negative for cough, hemoptysis, sputum production, shortness of breath and wheezing.   Cardiovascular: Negative for chest pain, orthopnea and leg swelling.  Gastrointestinal: Negative for abdominal pain, blood in stool, constipation, diarrhea, heartburn, melena, nausea and vomiting.  Genitourinary: Negative for dysuria, flank pain, frequency, hematuria and urgency.  Musculoskeletal: Negative for myalgias.  Skin: Negative for rash.  Neurological: Negative for dizziness, sensory change, speech change, focal weakness and headaches.  Psychiatric/Behavioral: Negative for substance abuse.    OBJECTIVE:  BP (!) 146/65 (BP Location: Right Arm)   Pulse (!) 56   Temp 98.2 F (36.8 C) (Oral)   Resp 16   SpO2 100%   Wt Readings from Last 3 Encounters:  05/10/18 129 lb (58.5 kg)  02/13/18 128 lb 8 oz (58.3 kg)  11/07/17 129 lb (58.5 kg)   Temp Readings from Last 3 Encounters:  08/05/18 98.2 F (36.8 C) (Oral)  04/04/18  98.7 F (37.1 C) (Oral)  03/21/18 (!) 100.5 F (38.1 C) (Oral)   BP Readings from Last 3 Encounters:  08/05/18 (!) 146/65  05/10/18 (!) 170/72  04/04/18 138/88   Pulse Readings from Last 3 Encounters:  08/05/18 (!) 56  05/10/18 (!) 51  04/04/18 88    Physical Exam Vitals signs and nursing note reviewed.  Constitutional:      Appearance: He is well-developed. He is not ill-appearing or diaphoretic.  Eyes:     Conjunctiva/sclera: Conjunctivae normal.     Pupils: Pupils are equal, round, and reactive to light.  Cardiovascular:     Rate and Rhythm: Normal rate and regular rhythm.  Pulmonary:     Effort: Pulmonary effort is normal.     Breath sounds: Normal breath sounds.  Abdominal:     General: There is no distension.  Musculoskeletal: Normal range of motion.     Right lower leg: No edema.     Left lower leg: No edema.  Skin:    General: Skin is warm and dry.  Neurological:     Mental Status: He is alert and oriented to person, place, and time.     Cranial Nerves: No cranial nerve deficit.     Coordination: Coordination normal.    [id number] brady.  Left axis deviation. Possible left atrial enlargment.  Negative LVH.  - ischemia and infarction. No change from prior.   Results for orders placed or performed during the hospital encounter of 08/05/18 (from the past 72 hour(s))  POCT urinalysis dip (device)  Status: None   Collection Time: 08/05/18 12:02 PM  Result Value Ref Range   Glucose, UA NEGATIVE NEGATIVE mg/dL   Bilirubin Urine NEGATIVE NEGATIVE   Ketones, ur NEGATIVE NEGATIVE mg/dL   Specific Gravity, Urine >=1.030 1.005 - 1.030   Hgb urine dipstick NEGATIVE NEGATIVE   pH 5.5 5.0 - 8.0   Protein, ur NEGATIVE NEGATIVE mg/dL   Urobilinogen, UA 0.2 0.0 - 1.0 mg/dL   Nitrite NEGATIVE NEGATIVE   Leukocytes,Ua NEGATIVE NEGATIVE    Comment: Biochemical Testing Only. Please order routine urinalysis from main lab if confirmatory testing is needed.    No results  found.  ASSESSMENT AND PLAN:   Malaise and fatigue - Likely chronic and worsening dysthymia. Needs further workup.  He will call Dr. Azucena Kubaeid, his primary, on Monday to explore that possibility.    Discharge Instructions     Your EKG is normal relative to previous tracings.          The patient is advised to call or return to clinic if he does not see an improvement in symptoms, or to seek the care of the closest emergency department if he worsens with the above plan.   Deliah BostonMichael Laretha Luepke, MHS, PA-C 08/05/2018 12:08 PM   Ofilia Neaslark, Zonnique Norkus L, PA-C 08/05/18 1210

## 2018-10-11 DIAGNOSIS — F4489 Other dissociative and conversion disorders: Secondary | ICD-10-CM | POA: Diagnosis not present

## 2018-10-11 DIAGNOSIS — R300 Dysuria: Secondary | ICD-10-CM | POA: Diagnosis not present

## 2018-10-11 DIAGNOSIS — R350 Frequency of micturition: Secondary | ICD-10-CM | POA: Diagnosis not present

## 2018-10-12 ENCOUNTER — Telehealth: Payer: Self-pay | Admitting: *Deleted

## 2018-10-12 NOTE — Telephone Encounter (Signed)
Patient stopped by the office.  States he went to see Dr. Mariea Clonts because he hasnt been feeling well.  Dr. Mariea Clonts told him he should continue with Donepezil 5Mg  but he said he didn't know he was even suppose to be taking  it.  Should he be taking it?  When was he prescribed it?  Would like a call back as soon as possible to discuss.

## 2018-10-12 NOTE — Telephone Encounter (Signed)
Called and spoke with pt. Advised donepezil prescribed 05/10/18 at last OV. Explained this was for his memory, to help slow progression of memory loss. Rx at [facility] for him to pick up. He verbalized understanding and appreciation for call.

## 2018-11-08 ENCOUNTER — Other Ambulatory Visit: Payer: Self-pay

## 2018-11-08 ENCOUNTER — Ambulatory Visit (INDEPENDENT_AMBULATORY_CARE_PROVIDER_SITE_OTHER): Payer: Medicare Other | Admitting: Neurology

## 2018-11-08 ENCOUNTER — Encounter: Payer: Self-pay | Admitting: Neurology

## 2018-11-08 VITALS — BP 122/64 | HR 53 | Temp 98.4°F | Ht 70.0 in | Wt 132.0 lb

## 2018-11-08 DIAGNOSIS — R269 Unspecified abnormalities of gait and mobility: Secondary | ICD-10-CM | POA: Diagnosis not present

## 2018-11-08 DIAGNOSIS — G231 Progressive supranuclear ophthalmoplegia [Steele-Richardson-Olszewski]: Secondary | ICD-10-CM

## 2018-11-08 DIAGNOSIS — R200 Anesthesia of skin: Secondary | ICD-10-CM | POA: Diagnosis not present

## 2018-11-08 DIAGNOSIS — I639 Cerebral infarction, unspecified: Secondary | ICD-10-CM | POA: Diagnosis not present

## 2018-11-08 DIAGNOSIS — R413 Other amnesia: Secondary | ICD-10-CM | POA: Diagnosis not present

## 2018-11-08 MED ORDER — DONEPEZIL HCL 5 MG PO TABS: 5.0000 mg | ORAL_TABLET | Freq: Every day | ORAL | 11 refills | Status: DC

## 2018-11-08 NOTE — Progress Notes (Signed)
[facility]  PATIENT: David Scott DOB: [birth year]  REFERRING DOCTOR OR PCP:  Maury Dus, MD SOURCE: patient, notes from the ED, imaging and lab report, CT head images on PACS.  _________________________________   HISTORICAL  CHIEF COMPLAINT:  Chief Complaint  Patient presents with  . Follow-up    RM 13, alone. Last seen 05/10/18. Hx CVA, memory problems. Last [facility] 04/12/17 26/30. Did repeat MOCA today.. No falls since last seen.     HISTORY OF PRESENT ILLNESS:  David Scott is a 78 y.o. man with gait disturbance, memory disturbance and h/o stroke  Update 11/08/2018: He felt poorly a few days ago like he had the flu (tired and achy) but had no fever.  He was fine the next day.         He feels his gait is stable.   He denies any falls.  He has been advised to use a cane or walker but does not use one.  He feels his memory is doing about the same.   He handles his finances.    At the last visit, we called in donepezil but he decided not to take it.   He has not been having any headaches recently.   No neck pain currently.  He eats fast foods mostly.  We discussed a more healthy diet.  Montreal Cognitive Assessment  11/08/2018 04/12/2017  Visuospatial/ Executive (0/5) 5 4  Naming (0/3) 3 3  Attention: Read list of digits (0/2) 2 2  Attention: Read list of letters (0/1) 1 1  Attention: Serial 7 subtraction starting at 100 (0/3) 3 3  Language: Repeat phrase (0/2) 2 2  Language : Fluency (0/1) 0 0  Abstraction (0/2) 2 2  Delayed Recall (0/5) 5 3  Orientation (0/6) 6 6  Total 29 26  Adjusted Score (based on education) - 26     Update 05/10/2018: After the last visit, due to worsening gait and head discomfort, he had a CT scan.  It showed remote left frontal encephalomalacia consistent with his previous stroke.  He also had a remote small right cerebellar hemisphere lacunar infarction and some chronic microvascular ischemic changes.  The CT  looked unchanged compared to the 11/03/2017 CT scan.   The cerebellar stroke was acute in 2018.  He continues to report difficulties with his gait.  He denies any falls since the last visit.  We had discussed using a cane but he is walking without one.   No temor.     He denies any new headache and the head discomfort is better.    He notes reduced upgaze.   No change in speech.     He has had more difficulty with focus short term memory in general.      Update 02/13/2018: He walks in today, unscheduled, stating he has not been feeling good.  He feels like there is a weight on his head.   This is not painful but feels different.   He denies any new change in gait though gait has ben worse this year.    He denies any new numbness or weakness.   He denies recent change in bladder function though has some urgency at times, worse the past few years.     He notes some mild memory difficulties but feels that this is stable and unchanged since the last visit.  MRI cervical spine 11/14/2017 (performed due to worsening gait) showed multilevel DJD with mild spinal stenosis at C2C3 and C3C4  and borderline spinal stenosis at C5C6.     MRI 02/23/2017 had shown a small acute on chronic stroke in the right PICA distribution of the cerebellum.    He has left inferior chronic post traumatic encephalomalacia  Update 11/07/2017: He went to the ED last week because he was having a LUQ abdominal pain and also noted his walking was doing worse.   He has no recent falls.    In the ED, labs were c/w mild dehydration.   Imaging and lab results were reviewed.   I personally reviewed the CT scan he had in the ED and it shows the old frontal encephalomalacia but no acute findings.    Abdominal pain is better.   Gait uis the same as last year.     Late last year, an MRI showed a very small acute right cerebellar CVA and a chronic left frontal encephalomalacia.   He is on ASA prophylaxis.   He has had an Echo with bubble contrast  and it was ok except mild LVH.    Carotid U/S was normal for age.      Update 04/12/2017: Since his last visit, he has had an MRI of the brain (personally reviewed today) that did show a small acute cerebellar stroke and there was also some encephalomalacia in the left frontal lobe (also present on 2017 CT scan). He feels he is doing better since his last visit. He has been taking the aspirin 81 mg daily as ordered. An echocardiogram with bubble contrast did not show any significant problem (some LVH).    Carotid U/S has not been done.  He feels memory is doing about the same.    Sometimes he feels depression but not daily.    Montreal Cognitive Assessment  04/12/2017  Visuospatial/ Executive (0/5) 4  Naming (0/3) 3  Attention: Read list of digits (0/2) 2  Attention: Read list of letters (0/1) 1  Attention: Serial 7 subtraction starting at 100 (0/3) 3  Language: Repeat phrase (0/2) 2  Language : Fluency (0/1) 0  Abstraction (0/2) 2  Delayed Recall (0/5) 3  Orientation (0/6) 6  Total 26  Adjusted Score (based on education) 26      From 02/03/2017: On 01/28/2017, he presented to the emergency room feeling confused and disoriented that morning he felt fine when he went to bed the previous night.   Creedence does not remember that day.   His son was with him and noted that he was fine physically but did not remember things he should like where his money was.    He presented to the emergency room at [facility] and felt fine by the time he was evaluated without any new complaints. A CT scan was performed. I personally reviewed the images. There were no acute findings and an old left inferior frontal lobe stroke.   CBC and CMP were essentially normal. There was no evidence of UTI.   He was felt to be back to baseline according to his family and was discharged without admission.   However, his some still feels his STM is not completely back to baseline.  He hasn't felt comfortable enough to return back  to driving     He reports sleeping well most nights.   He falls asleep quick but will occasionally wake up.   He does not have noticeable snoring.   He denies actual depression .  However he is more apathetic.  No significant weight loss (3 pound loss in  past year) but he feels decreased appetite.   His son does not feel he's depressed.    He spends his typical day going out ofr breakfast ([employer] often) and drives around to do some chores.     He denies any change in gait or strength.     I personally reviewed the CT scan dated 01/28/2017 and compared to CT scan dated 08/09/2015. He has a small old infarct in the anterior inferior left frontal lobe present on both scans. There is mild atrophy that could be appropriate for age. There were no acute findings.  REVIEW OF SYSTEMS: Constitutional: No fevers, chills, sweats, or change in appetite Eyes: No visual changes, double vision, eye pain Ear, nose and throat: No hearing loss, ear pain, nasal congestion, sore throat Cardiovascular: No chest pain, palpitations Respiratory: No shortness of breath at rest or with exertion.   He takes ventolin at night.   GastrointestinaI: No nausea, vomiting, diarrhea, abdominal pain, fecal incontinence Genitourinary: No dysuria, urinary retention or frequency.  No nocturia. Musculoskeletal: No neck pain, back pain Integumentary: No rash, pruritus, skin lesions Neurological: as above Psychiatric: No depression at this time.  No anxiety Endocrine: No palpitations, diaphoresis, change in appetite, change in weigh or increased thirst Hematologic/Lymphatic: No anemia, purpura, petechiae. Allergic/Immunologic: No itchy/runny eyes, nasal congestion, recent allergic reactions, rashes  ALLERGIES: Allergies  Allergen Reactions  . Septra [Sulfamethoxazole-Trimethoprim] Rash    Pt states MD had him taking Sulfa by the handful when he had prostate infection, says they don't know what caused the rash    HOME  MEDICATIONS:  Current Outpatient Medications:  .  albuterol (PROVENTIL HFA;VENTOLIN HFA) 108 (90 Base) MCG/ACT inhaler, Inhale 2 puffs into the lungs every 6 (six) hours as needed for wheezing or shortness of breath. , Disp: , Rfl:  .  aspirin 81 MG tablet, Take 81 mg by mouth daily after lunch. , Disp: , Rfl:  .  Cholecalciferol (VITAMIN D-3 PO), Take 1 capsule by mouth daily after lunch. , Disp: , Rfl:  .  donepezil (ARICEPT) 5 MG tablet, Take 1 tablet (5 mg total) by mouth at bedtime., Disp: 30 tablet, Rfl: 11 .  fish oil-omega-3 fatty acids 1000 MG capsule, Take 1 g by mouth daily.  , Disp: , Rfl:  .  Ipratropium-Albuterol (COMBIVENT) 20-100 MCG/ACT AERS respimat, Inhale 1 puff into the lungs every 6 (six) hours., Disp: 1 Inhaler, Rfl: 0 .  Magnesium 250 MG TABS, Take 250 mg by mouth daily., Disp: , Rfl:  .  Multiple Vitamin (MULTIVITAMIN) tablet, Take 1 tablet by mouth 2 (two) times daily after a meal. , Disp: , Rfl:  .  NON FORMULARY, , Disp: , Rfl:  .  Probiotic CAPS, Take 1 capsule by mouth daily., Disp: , Rfl:  .  Propylene Glycol (SYSTANE BALANCE) 0.6 % SOLN, Place 1 drop into both eyes 3 (three) times daily as needed (for eye irritation or dryness). , Disp: , Rfl:  .  vitamin C (ASCORBIC ACID) 500 MG tablet, Take 1,000 mg by mouth daily., Disp: , Rfl:   PAST MEDICAL HISTORY: Past Medical History:  Diagnosis Date  . CVA (cerebral vascular accident) ([place])   . Esophageal reflux     PAST SURGICAL HISTORY: No past surgical history on file.  FAMILY HISTORY: Family History  Problem Relation Age of Onset  . COPD Father     SOCIAL HISTORY:  Social History   Socioeconomic History  . Marital status: Single    Spouse name:  Not on file  . Number of children: Not on file  . Years of education: Not on file  . Highest education level: Not on file  Occupational History  . Not on file  Social Needs  . Financial resource strain: Not on file  . Food insecurity    Worry: Not on  file    Inability: Not on file  . Transportation needs    Medical: Not on file    Non-medical: Not on file  Tobacco Use  . Smoking status: Never Smoker  . Smokeless tobacco: Never Used  Substance and Sexual Activity  . Alcohol use: No  . Drug use: No  . Sexual activity: Not on file  Lifestyle  . Physical activity    Days per week: Not on file    Minutes per session: Not on file  . Stress: Not on file  Relationships  . Social Herbalist on phone: Not on file    Gets together: Not on file    Attends religious service: Not on file    Active member of club or organization: Not on file    Attends meetings of clubs or organizations: Not on file    Relationship status: Not on file  . Intimate partner violence    Fear of current or ex partner: Not on file    Emotionally abused: Not on file    Physically abused: Not on file    Forced sexual activity: Not on file  Other Topics Concern  . Not on file  Social History Narrative  . Not on file     PHYSICAL EXAM  Vitals:   11/08/18 1526  BP: 122/64  Pulse: (!) 53  Temp: 98.4 F (36.9 C)  Weight: 132 lb (59.9 kg)  Height: '5\' 10"'  (1.778 m)    Body mass index is 18.94 kg/m.   General: The patient is well-developed and well-nourished and in no acute distress.    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     Neurologic Exam  Mental status: The patient is alert and oriented x 3 at the time of the examination. The patient has reduced STM (1/3 without prompt and 2/3 with prompt), with a reduced attention span (100-93-86-?).   Speech is normal.  Cranial nerves: No volitional upgaze (supranuclear as oculocephalic movements allow some upgaze)    . Facial strength and sensation are normal. Trapezius and sternocleidomastoid strength is normal. No dysarthria is noted.  The tongue is midline, and the patient has symmetric elevation of the soft palate. No obvious hearing deficits are noted.  Motor:  He is mildly bradykinetic  but there is no tremor and muscle tone is normal.  Muscle bulk is normal. Strength is  5 / 5 in all 4 extremities.   Sensory: He has intact sensation to touch and vibration in the arms. There is reduced vibration in the toes and ankles (about 50%).       Coordination: Cerebellar testing reveals good finger-nose-finger and heel-to-shin bilaterally.  Gait and station: Station is normal.  His gait has a reduced stride and he looks down as he walks. He turns 180 degrees in 3-4 steps.   There is retropulsion with disturbance of station   Reflexes: Deep tendon reflexes are 2 and symmetric in the arms and legs.      DIAGNOSTIC DATA (LABS, IMAGING, TESTING) - I reviewed patient records, labs, notes, testing and imaging myself where available.  Lab Results  Component Value Date  WBC 4.4 02/28/2018   HGB 13.0 02/28/2018   HCT 41.0 02/28/2018   MCV 101.2 (H) 02/28/2018   PLT 218 02/28/2018      Component Value Date/Time   NA 137 02/28/2018 1759   K 4.4 02/28/2018 1759   CL 102 02/28/2018 1759   CO2 27 02/28/2018 1759   GLUCOSE 134 (H) 02/28/2018 1759   BUN 27 (H) 02/28/2018 1759   CREATININE 1.08 02/28/2018 1759   CALCIUM 9.1 02/28/2018 1759   PROT 6.6 11/03/2017 1726   PROT 6.3 02/03/2017 1037   ALBUMIN 4.0 11/03/2017 1726   AST 24 11/03/2017 1726   ALT 18 11/03/2017 1726   ALKPHOS 68 11/03/2017 1726   BILITOT 0.7 11/03/2017 1726   GFRNONAA >60 02/28/2018 1759   GFRAA >60 02/28/2018 1759       ASSESSMENT AND PLAN    1. Supranuclear palsy ([place])   2. Cerebrovascular accident (CVA), unspecified mechanism ([place])   3. Memory loss   4. Gait disturbance   5. Numbness     1.   He has stable gait disturbance and bradykinesia, essentially unchanged compared to the last visit.  He also has decreased upgaze consistent with a supranuclear palsy.  Although he has some cognitive issues, he scored very well on the [facility] cognitive assessment today (29/30) he could have a  neurodegenerative process (ie PSP or other parkinsonian).  Exam not consistent with idiopathic (common) Parkinson's.    2.   I would like him to try Donepezil 5 mg and increase further if well tolerated to see if memory issues improve.   I will sendhte prescription in again.    3.   I advised him to use walker or a cane for reduced balance 4.   He might have a mild polyneuropathy but it is not severe enough (only partial foot numbness) to affect gait..   B12, SPEP/IEF, ESR were fine in late 2018.    5.   rtc in 6 months or sooner if new or worsening neurologic problems.  Can see NP next visit (alternating with me)  Amran Malter A. Felecia Shelling, MD, PhD, Charlynn Grimes 3/00/9233, 0:07 PM Certified in Neurology, Clinical Neurophysiology, Sleep Medicine, Pain Medicine and Neuroimaging  [facility] [address] [place], [place] [address] [phone]

## 2018-12-21 DIAGNOSIS — J449 Chronic obstructive pulmonary disease, unspecified: Secondary | ICD-10-CM | POA: Diagnosis not present

## 2018-12-21 DIAGNOSIS — L301 Dyshidrosis [pompholyx]: Secondary | ICD-10-CM | POA: Diagnosis not present

## 2018-12-21 DIAGNOSIS — E46 Unspecified protein-calorie malnutrition: Secondary | ICD-10-CM | POA: Diagnosis not present

## 2018-12-21 DIAGNOSIS — L219 Seborrheic dermatitis, unspecified: Secondary | ICD-10-CM | POA: Diagnosis not present

## 2018-12-21 DIAGNOSIS — Z0000 Encounter for general adult medical examination without abnormal findings: Secondary | ICD-10-CM | POA: Diagnosis not present

## 2018-12-21 DIAGNOSIS — I700 Atherosclerosis of aorta: Secondary | ICD-10-CM | POA: Diagnosis not present

## 2018-12-21 DIAGNOSIS — G4700 Insomnia, unspecified: Secondary | ICD-10-CM | POA: Diagnosis not present

## 2018-12-21 DIAGNOSIS — Z1389 Encounter for screening for other disorder: Secondary | ICD-10-CM | POA: Diagnosis not present

## 2019-03-05 ENCOUNTER — Ambulatory Visit (HOSPITAL_COMMUNITY)
Admission: EM | Admit: 2019-03-05 | Discharge: 2019-03-05 | Disposition: A | Discharge status: Home / Self Care | Payer: Medicare Other | Attending: Family Medicine | Admitting: Family Medicine

## 2019-03-05 ENCOUNTER — Encounter (HOSPITAL_COMMUNITY): Payer: Self-pay | Admitting: Emergency Medicine

## 2019-03-05 ENCOUNTER — Other Ambulatory Visit: Payer: Self-pay

## 2019-03-05 DIAGNOSIS — Z711 Person with feared health complaint in whom no diagnosis is made: Secondary | ICD-10-CM

## 2019-03-05 DIAGNOSIS — R110 Nausea: Secondary | ICD-10-CM

## 2019-03-05 DIAGNOSIS — R030 Elevated blood-pressure reading, without diagnosis of hypertension: Secondary | ICD-10-CM | POA: Diagnosis not present

## 2019-03-05 DIAGNOSIS — Z0389 Encounter for observation for other suspected diseases and conditions ruled out: Secondary | ICD-10-CM

## 2019-03-05 HISTORY — DX: Unspecified fracture of unspecified lower leg, initial encounter for closed fracture: S82.90XA

## 2019-03-05 NOTE — ED Notes (Signed)
Patient is not able to express why he is here.  Son is a patient too and when asked why this patient is here, son said he did not know.  Son and patient only admit to albuterol in patient med list.  Patient lives with son, but handles his own medici cations.

## 2019-03-05 NOTE — ED Triage Notes (Signed)
Reports having a strange feeling in upper body.  Patient is non-specific about details.  Family member is also being seen in department for a different complaint.

## 2019-03-05 NOTE — Discharge Instructions (Addendum)
Please continue to monitor for symptoms over the next few days Rest and drink plenty of fluids Follow up with Primary for blood pressure recheck  Please go to Emergency Room if you start to experience severe headache, vision changes, decreased urine production, chest pain, shortness of breath, speech slurring, one sided weakness.

## 2019-03-07 NOTE — ED Provider Notes (Signed)
[facility]    CSN: [id number] Arrival date & time: 03/05/19  1644      History   Chief Complaint Chief Complaint  Patient presents with  . Medication Reaction    HPI David Scott is a 78 y.o. male history of previous CVA, memory loss, presenting today for evaluation of a sensation of uneasiness.  This afternoon patient began to feel uneasy.  He has a hard time describing how he felt.  Initially states he felt a strange feeling in his upper body.  States that he felt something in his stomach, but denies nausea or vomiting.  States that symptoms have resolved and he no longer feels this way.  Denies any fevers chills or body aches.  Denies any URI symptoms of cough congestion or sore throat.  Possibly related to eating a [place] [place] 1 hour prior.  He did recently start using ketoconazole cream on a rash to his neck, and was unsure if this could have been related to the new cream he was using.  He feels back to normal now.  His son denies any changes from his normal behavior, speech.  He denies chest pain or shortness of breath.  Denies dizziness or lightheadedness.  Denies weakness.  His son does note that he has been under more stress of recently as the patient's son has been struggling with schizophrenia worse of recently.  HPI  Past Medical History:  Diagnosis Date  . CVA (cerebral vascular accident) ([facility])   . Esophageal reflux   . Leg fracture     Patient Active Problem List   Diagnosis Date Noted  . Supranuclear palsy ([facility]) 05/10/2018  . Stroke ([facility]) 04/12/2017  . Memory loss 02/03/2017  . Gait disturbance 02/03/2017  . Polyneuropathy 02/03/2017  . Numbness 02/03/2017    History reviewed. No pertinent surgical history.     Home Medications    Prior to Admission medications   Medication Sig Start Date End Date Taking? Authorizing Provider  ketoconazole (NIZORAL) 2 % cream Apply 1 application topically daily.   Yes [provider]  albuterol  (PROVENTIL HFA;VENTOLIN HFA) 108 (90 Base) MCG/ACT inhaler Inhale 2 puffs into the lungs every 6 (six) hours as needed for wheezing or shortness of breath.  09/09/17   [provider]  aspirin 81 MG tablet Take 81 mg by mouth daily after lunch.     [provider]  Cholecalciferol (VITAMIN D-3 PO) Take 1 capsule by mouth daily after lunch.     [provider]  donepezil (ARICEPT) 5 MG tablet Take 1 tablet (5 mg total) by mouth at bedtime. 11/08/18   Sater, Pearletha Furlichard A, MD  fish oil-omega-3 fatty acids 1000 MG capsule Take 1 g by mouth daily.      [provider]  Ipratropium-Albuterol (COMBIVENT) 20-100 MCG/ACT AERS respimat Inhale 1 puff into the lungs every 6 (six) hours. 03/21/18   Eustace MooreNelson, Yvonne Sue, MD  Magnesium 250 MG TABS Take 250 mg by mouth daily.    [provider]  Multiple Vitamin (MULTIVITAMIN) tablet Take 1 tablet by mouth 2 (two) times daily after a meal.     [provider]  NON FORMULARY     [provider]  Probiotic CAPS Take 1 capsule by mouth daily.    [provider]  Propylene Glycol (SYSTANE BALANCE) 0.6 % SOLN Place 1 drop into both eyes 3 (three) times daily as needed (for eye irritation or dryness).     [provider]  vitamin C (ASCORBIC ACID) 500 MG tablet Take 1,000 mg by mouth daily.    [provider]    Family History Family History  Problem Relation Age of Onset  . COPD Father     Social History Social History   Tobacco Use  . Smoking status: Never Smoker  . Smokeless tobacco: Never Used  Substance Use Topics  . Alcohol use: No  . Drug use: No     Allergies   Septra [sulfamethoxazole-trimethoprim]   Review of Systems Review of Systems  Constitutional: Negative for activity change, appetite change, chills, fatigue and fever.  HENT: Negative for congestion, ear pain, rhinorrhea, sinus pressure, sore throat and trouble swallowing.   Eyes: Negative for  discharge and redness.  Respiratory: Negative for cough, chest tightness and shortness of breath.   Cardiovascular: Negative for chest pain.  Gastrointestinal: Positive for nausea. Negative for abdominal pain, diarrhea and vomiting.  Musculoskeletal: Negative for myalgias.  Skin: Negative for rash.  Neurological: Negative for dizziness, light-headedness and headaches.     Physical Exam Triage Vital Signs ED Triage Vitals  Enc Vitals Group     BP 03/05/19 1757 (!) 171/61     Pulse Rate 03/05/19 1757 (!) 50     Resp 03/05/19 1757 16     Temp 03/05/19 1757 98.6 F (37 C)     Temp Source 03/05/19 1757 Oral     SpO2 03/05/19 1757 100 %     Weight --      Height --      Head Circumference --      Peak Flow --      Pain Score 03/05/19 1749 0     Pain Loc --      Pain Edu? --      Excl. in GC? --    No data found.  Updated Vital Signs BP (!) 171/61 (BP Location: Right Arm)   Pulse (!) 50   Temp 98.6 F (37 C) (Oral)   Resp 16   SpO2 100%   Visual Acuity Right Eye Distance:   Left Eye Distance:   Bilateral Distance:    Right Eye Near:   Left Eye Near:    Bilateral Near:     Physical Exam Vitals signs and nursing note reviewed.  Constitutional:      Appearance: He is well-developed.  HENT:     Head: Normocephalic and atraumatic.     Ears:     Comments: Bilateral ears without tenderness to palpation of external auricle, tragus and mastoid, EAC's without erythema or swelling, TM's with good bony landmarks and cone of light. Non erythematous.     Mouth/Throat:     Comments: Oral mucosa pink and moist, no tonsillar enlargement or exudate. Posterior pharynx patent and nonerythematous, no uvula deviation or swelling. Normal phonation. Palate elevates symmetrically Eyes:     Extraocular Movements: Extraocular movements intact.     Conjunctiva/sclera: Conjunctivae normal.     Pupils: Pupils are equal, round, and reactive to light.  Neck:     Musculoskeletal: Neck  supple.  Cardiovascular:     Rate and Rhythm: Normal rate and regular rhythm.     Heart sounds: No murmur.  Pulmonary:     Effort: Pulmonary effort is normal. No respiratory distress.     Breath sounds: Normal breath sounds.     Comments: Breathing comfortably at rest, CTABL, no wheezing, rales or other adventitious sounds auscultated  Abdominal:     Palpations: Abdomen is  soft.     Tenderness: There is no abdominal tenderness.  Musculoskeletal:     Comments: Moves all extremities appropriately, ambulates independently  Skin:    General: Skin is warm and dry.  Neurological:     General: No focal deficit present.     Mental Status: He is alert and oriented to person, place, and time.     Comments: Responses are often slow/delayed, but no slurred speech, face symmetric      UC Treatments / Results  Labs (all labs ordered are listed, but only abnormal results are displayed) Labs Reviewed - No data to display  EKG   Radiology No results found.  Procedures Procedures (including critical care time)  Medications Ordered in UC Medications - No data to display  Initial Impression / Assessment and Plan / UC Course  I have reviewed the triage vital signs and the nursing notes.  Pertinent labs & imaging results that were available during my care of the patient were reviewed by me and considered in my medical decision making (see chart for details).     Patient has difficulty verbalizing exactly how he was feeling earlier today.  Symptoms have fully resolved.  Vital signs stable in clinic, did discuss elevated blood pressure and advised to continue to monitor.  Follow-up with primary care for recheck of this.  Currently asymptomatic.  We will continue to monitor over the next couple of days, lives at home with his son who can keep a close eye on him.  Discussed warning signs of stroke/heart attack, monitor for development of any signs of Covid.Discussed strict return precautions.  Patient verbalized understanding and is agreeable with plan.    Final Clinical Impressions(s) / UC Diagnoses   Final diagnoses:  Worried well  Nausea     Discharge Instructions     Please continue to monitor for symptoms over the next few days Rest and drink plenty of fluids Follow up with Primary for blood pressure recheck  Please go to Emergency Room if you start to experience severe headache, vision changes, decreased urine production, chest pain, shortness of breath, speech slurring, one sided weakness.   ED Prescriptions    None     PDMP not reviewed this encounter.   Janith Lima, [place] 03/07/19 1538

## 2019-05-04 DIAGNOSIS — H25813 Combined forms of age-related cataract, bilateral: Secondary | ICD-10-CM | POA: Diagnosis not present

## 2019-05-14 ENCOUNTER — Telehealth (INDEPENDENT_AMBULATORY_CARE_PROVIDER_SITE_OTHER): Payer: Medicare Other | Admitting: Family Medicine

## 2019-05-14 ENCOUNTER — Encounter: Payer: Self-pay | Admitting: Family Medicine

## 2019-05-14 DIAGNOSIS — G231 Progressive supranuclear ophthalmoplegia [Steele-Richardson-Olszewski]: Secondary | ICD-10-CM

## 2019-05-14 DIAGNOSIS — R413 Other amnesia: Secondary | ICD-10-CM

## 2019-05-14 NOTE — Progress Notes (Signed)
PATIENT: David Scott DOB: 11-13-1940  REASON FOR VISIT: follow up HISTORY FROM: patient  Virtual Visit via Telephone Note  I connected with David Scott on 05/14/19 at  3:30 PM EST by telephone and verified that I am speaking with the correct person using two identifiers.   I discussed the limitations, risks, security and privacy concerns of performing an evaluation and management service by telephone and the availability of in person appointments. I also discussed with the patient that there may be a patient responsible charge related to this service. The patient expressed understanding and agreed to proceed.   History of Present Illness:  05/14/19 David Scott is a 79 y.o. male here today for follow up for supranuclear palsy, memory loss and h/o stroke.  David Scott reports that he is doing fairly well today.  He denies any new or worsening symptoms since last seen by Dr. Epimenio Foot in July.  He has not continue donepezil.  He is uncertain why but does not feel that he had any adverse effects.  He does not feel that he needs this medication.  He feels that gait is stable.  He has had 1 fall over the past 6 months.  He reports losing his footing in the rain.  Fortunately there were no injuries.  He is not using any assistive devices.  He lives at home with his 3 sons.  He feels that appetite is good.  He denies any concerns of headaches.  He feels that memory is stable.  HISTORY: (copied from Dr Bonnita Hollow note on 11/08/2018)  David Scott is a 79 y.o. man with gait disturbance, memory disturbance and h/o stroke  Update 11/08/2018: He felt poorly a few days ago like he had the flu (tired and achy) but had no fever.  He was fine the next day.         He feels his gait is stable.   He denies any falls.  He has been advised to use a cane or walker but does not use one.  He feels his memory is doing about the same.   He handles his finances.    At the last visit, we called in  donepezil but he decided not to take it.   He has not been having any headaches recently.   No neck pain currently.  He eats fast foods mostly.  We discussed a more healthy diet.  Montreal Cognitive Assessment  11/08/2018 04/12/2017  Visuospatial/ Executive (0/5) 5 4  Naming (0/3) 3 3  Attention: Read list of digits (0/2) 2 2  Attention: Read list of letters (0/1) 1 1  Attention: Serial 7 subtraction starting at 100 (0/3) 3 3  Language: Repeat phrase (0/2) 2 2  Language : Fluency (0/1) 0 0  Abstraction (0/2) 2 2  Delayed Recall (0/5) 5 3  Orientation (0/6) 6 6  Total 29 26  Adjusted Score (based on education) - 26     Update 05/10/2018: After the last visit, due to worsening gait and head discomfort, he had a CT scan.  It showed remote left frontal encephalomalacia consistent with his previous stroke.  He also had a remote small right cerebellar hemisphere lacunar infarction and some chronic microvascular ischemic changes.  The CT looked unchanged compared to the 11/03/2017 CT scan.   The cerebellar stroke was acute in 2018.  He continues to report difficulties with his gait.  He denies any falls since the last visit.  We had discussed using  a cane but he is walking without one.   No temor.     He denies any new headache and the head discomfort is better.    He notes reduced upgaze.   No change in speech.     He has had more difficulty with focus short term memory in general.      Update 02/13/2018: He walks in today, unscheduled, stating he has not been feeling good.  He feels like there is a weight on his head.   This is not painful but feels different.   He denies any new change in gait though gait has ben worse this year.    He denies any new numbness or weakness.   He denies recent change in bladder function though has some urgency at times, worse the past few years.     He notes some mild memory difficulties but feels that this is stable and unchanged since the last  visit.  MRI cervical spine 11/14/2017 (performed due to worsening gait) showed multilevel DJD with mild spinal stenosis at C2C3 and C3C4 and borderline spinal stenosis at C5C6.     MRI 02/23/2017 had shown a small acute on chronic stroke in the right PICA distribution of the cerebellum.    He has left inferior chronic post traumatic encephalomalacia  Update 11/07/2017: He went to the ED last week because he was having a LUQ abdominal pain and also noted his walking was doing worse.   He has no recent falls.    In the ED, labs were c/w mild dehydration.   Imaging and lab results were reviewed.   I personally reviewed the CT scan he had in the ED and it shows the old frontal encephalomalacia but no acute findings.    Abdominal pain is better.   Gait uis the same as last year.     Late last year, an MRI showed a very small acute right cerebellar CVA and a chronic left frontal encephalomalacia.   He is on ASA prophylaxis.   He has had an Echo with bubble contrast and it was ok except mild LVH.    Carotid U/S was normal for age.      Update 04/12/2017: Since his last visit, he has had an MRI of the brain (personally reviewed today) that did show a small acute cerebellar stroke and there was also some encephalomalacia in the left frontal lobe (also present on 2017 CT scan). He feels he is doing better since his last visit. He has been taking the aspirin 81 mg daily as ordered. An echocardiogram with bubble contrast did not show any significant problem (some LVH).    Carotid U/S has not been done.  He feels memory is doing about the same.    Sometimes he feels depression but not daily.        Montreal Cognitive Assessment  04/12/2017  Visuospatial/ Executive (0/5) 4  Naming (0/3) 3  Attention: Read list of digits (0/2) 2  Attention: Read list of letters (0/1) 1  Attention: Serial 7 subtraction starting at 100 (0/3) 3  Language: Repeat phrase (0/2) 2  Language : Fluency (0/1) 0  Abstraction (0/2)  2  Delayed Recall (0/5) 3  Orientation (0/6) 6  Total 26  Adjusted Score (based on education) 26      From 02/03/2017: On 01/28/2017, he presented to the emergency room feeling confused and disoriented that morning he felt fine when he went to bed the previous night.   [place] does  not remember that day.   His son was with him and noted that he was fine physically but did not remember things he should like where his money was.    He presented to the emergency room at [facility] and felt fine by the time he was evaluated without any new complaints. A CT scan was performed. I personally reviewed the images. There were no acute findings and an old left inferior frontal lobe stroke.   CBC and CMP were essentially normal. There was no evidence of UTI.   He was felt to be back to baseline according to his family and was discharged without admission.   However, his some still feels his STM is not completely back to baseline.  He hasn't felt comfortable enough to return back to driving     He reports sleeping well most nights.   He falls asleep quick but will occasionally wake up.   He does not have noticeable snoring.   He denies actual depression .  However he is more apathetic.  No significant weight loss (3 pound loss in past year) but he feels decreased appetite.   His son does not feel he's depressed.    He spends his typical day going out ofr breakfast ([employer] often) and drives around to do some chores.     He denies any change in gait or strength.     I personally reviewed the CT scan dated 01/28/2017 and compared to CT scan dated 08/09/2015. He has a small old infarct in the anterior inferior left frontal lobe present on both scans. There is mild atrophy that could be appropriate for age. There were no acute findings.    Observations/Objective:  Generalized: Well developed, in no acute distress  Mentation: Alert oriented to time, place, history taking. Follows all commands speech and  language fluent Motor: Moves extremities freely.   Assessment and Plan:  79 y.o. year old male  has a past medical history of CVA (cerebral vascular accident) (HCC), Esophageal reflux, and Leg fracture. here with    ICD-10-CM   1. Supranuclear palsy (HCC)  G23.1   2. Memory loss  R41.3     David Scott is doing well.  He has opted not to continue donepezil.  He feels that memory is stable.  He has had 1 fall but fortunately there were no injuries.  Overall, he feels that his gait is stable.  He was advised to consider using an assistive device.  He has no concerns today.  He is advised to continue close follow-up with primary care.  Adequate hydration, well-balanced diet and regular exercise advised.  He will return for follow-up in the office in 6 months.  He verbalizes understanding and agreement with this plan.  Appointment has been scheduled for July.  No orders of the defined types were placed in this encounter.   No orders of the defined types were placed in this encounter.    Follow Up Instructions:  I discussed the assessment and treatment plan with the patient. The patient was provided an opportunity to ask questions and all were answered. The patient agreed with the plan and demonstrated an understanding of the instructions.   The patient was advised to call back or seek an in-person evaluation if the symptoms worsen or if the condition fails to improve as anticipated.  I provided 20 minutes of non-face-to-face time during this encounter.  Patient is located at his place of residence during my chart visit  Provider  is located at her place of residence.   Debbora Presto, NP

## 2019-05-14 NOTE — Progress Notes (Signed)
I have read the note, and I agree with the clinical assessment and plan.  Romano Stigger A. Akshaj Besancon, MD, PhD, FAAN Certified in Neurology, Clinical Neurophysiology, Sleep Medicine, Pain Medicine and Neuroimaging  [facility] [address], [address] , [place] [address] [phone]  

## 2019-08-27 DIAGNOSIS — R5383 Other fatigue: Secondary | ICD-10-CM | POA: Diagnosis not present

## 2019-08-27 DIAGNOSIS — R413 Other amnesia: Secondary | ICD-10-CM | POA: Diagnosis not present

## 2019-11-13 ENCOUNTER — Ambulatory Visit: Payer: Medicare Other | Admitting: Neurology

## 2019-11-29 DIAGNOSIS — H04121 Dry eye syndrome of right lacrimal gland: Secondary | ICD-10-CM | POA: Diagnosis not present

## 2020-01-01 DIAGNOSIS — S0501XA Injury of conjunctiva and corneal abrasion without foreign body, right eye, initial encounter: Secondary | ICD-10-CM | POA: Diagnosis not present

## 2020-01-08 DIAGNOSIS — H109 Unspecified conjunctivitis: Secondary | ICD-10-CM | POA: Diagnosis not present

## 2020-01-08 DIAGNOSIS — R519 Headache, unspecified: Secondary | ICD-10-CM | POA: Diagnosis not present

## 2020-01-09 DIAGNOSIS — R519 Headache, unspecified: Secondary | ICD-10-CM | POA: Diagnosis not present

## 2020-01-09 DIAGNOSIS — H5789 Other specified disorders of eye and adnexa: Secondary | ICD-10-CM | POA: Diagnosis not present

## 2020-01-09 DIAGNOSIS — E440 Moderate protein-calorie malnutrition: Secondary | ICD-10-CM | POA: Diagnosis not present

## 2020-01-21 DIAGNOSIS — E440 Moderate protein-calorie malnutrition: Secondary | ICD-10-CM | POA: Diagnosis not present

## 2020-01-21 DIAGNOSIS — D7589 Other specified diseases of blood and blood-forming organs: Secondary | ICD-10-CM | POA: Diagnosis not present

## 2020-01-22 DIAGNOSIS — B356 Tinea cruris: Secondary | ICD-10-CM | POA: Diagnosis not present

## 2020-01-22 DIAGNOSIS — I10 Essential (primary) hypertension: Secondary | ICD-10-CM | POA: Diagnosis not present

## 2020-01-22 DIAGNOSIS — K4090 Unilateral inguinal hernia, without obstruction or gangrene, not specified as recurrent: Secondary | ICD-10-CM | POA: Diagnosis not present

## 2020-02-27 DIAGNOSIS — J449 Chronic obstructive pulmonary disease, unspecified: Secondary | ICD-10-CM | POA: Diagnosis not present

## 2020-02-27 DIAGNOSIS — G4700 Insomnia, unspecified: Secondary | ICD-10-CM | POA: Diagnosis not present

## 2020-02-27 DIAGNOSIS — Z0000 Encounter for general adult medical examination without abnormal findings: Secondary | ICD-10-CM | POA: Diagnosis not present

## 2020-02-27 DIAGNOSIS — I10 Essential (primary) hypertension: Secondary | ICD-10-CM | POA: Diagnosis not present

## 2020-02-27 DIAGNOSIS — Z1389 Encounter for screening for other disorder: Secondary | ICD-10-CM | POA: Diagnosis not present

## 2020-02-27 DIAGNOSIS — E46 Unspecified protein-calorie malnutrition: Secondary | ICD-10-CM | POA: Diagnosis not present

## 2020-02-27 DIAGNOSIS — L219 Seborrheic dermatitis, unspecified: Secondary | ICD-10-CM | POA: Diagnosis not present

## 2020-02-27 DIAGNOSIS — I700 Atherosclerosis of aorta: Secondary | ICD-10-CM | POA: Diagnosis not present

## 2020-02-27 DIAGNOSIS — L301 Dyshidrosis [pompholyx]: Secondary | ICD-10-CM | POA: Diagnosis not present

## 2020-03-14 DIAGNOSIS — H2513 Age-related nuclear cataract, bilateral: Secondary | ICD-10-CM | POA: Diagnosis not present

## 2020-03-14 DIAGNOSIS — H04123 Dry eye syndrome of bilateral lacrimal glands: Secondary | ICD-10-CM | POA: Diagnosis not present

## 2020-03-14 DIAGNOSIS — H25013 Cortical age-related cataract, bilateral: Secondary | ICD-10-CM | POA: Diagnosis not present

## 2020-03-14 DIAGNOSIS — H0102A Squamous blepharitis right eye, upper and lower eyelids: Secondary | ICD-10-CM | POA: Diagnosis not present

## 2020-06-18 DIAGNOSIS — H34233 Retinal artery branch occlusion, bilateral: Secondary | ICD-10-CM | POA: Diagnosis not present

## 2020-06-18 DIAGNOSIS — H04123 Dry eye syndrome of bilateral lacrimal glands: Secondary | ICD-10-CM | POA: Diagnosis not present

## 2020-06-18 DIAGNOSIS — H2513 Age-related nuclear cataract, bilateral: Secondary | ICD-10-CM | POA: Diagnosis not present

## 2020-06-18 DIAGNOSIS — H25013 Cortical age-related cataract, bilateral: Secondary | ICD-10-CM | POA: Diagnosis not present

## 2020-06-18 IMAGING — CR DG CHEST 2V
2 series · 2 of 2 positions shown · non-contrast
Comparison: 12/18/2016 chest radiograph

CLINICAL DATA: 77 y/o  M; months of weakness.

EXAM:
CHEST - 2 VIEW

[chest lat]
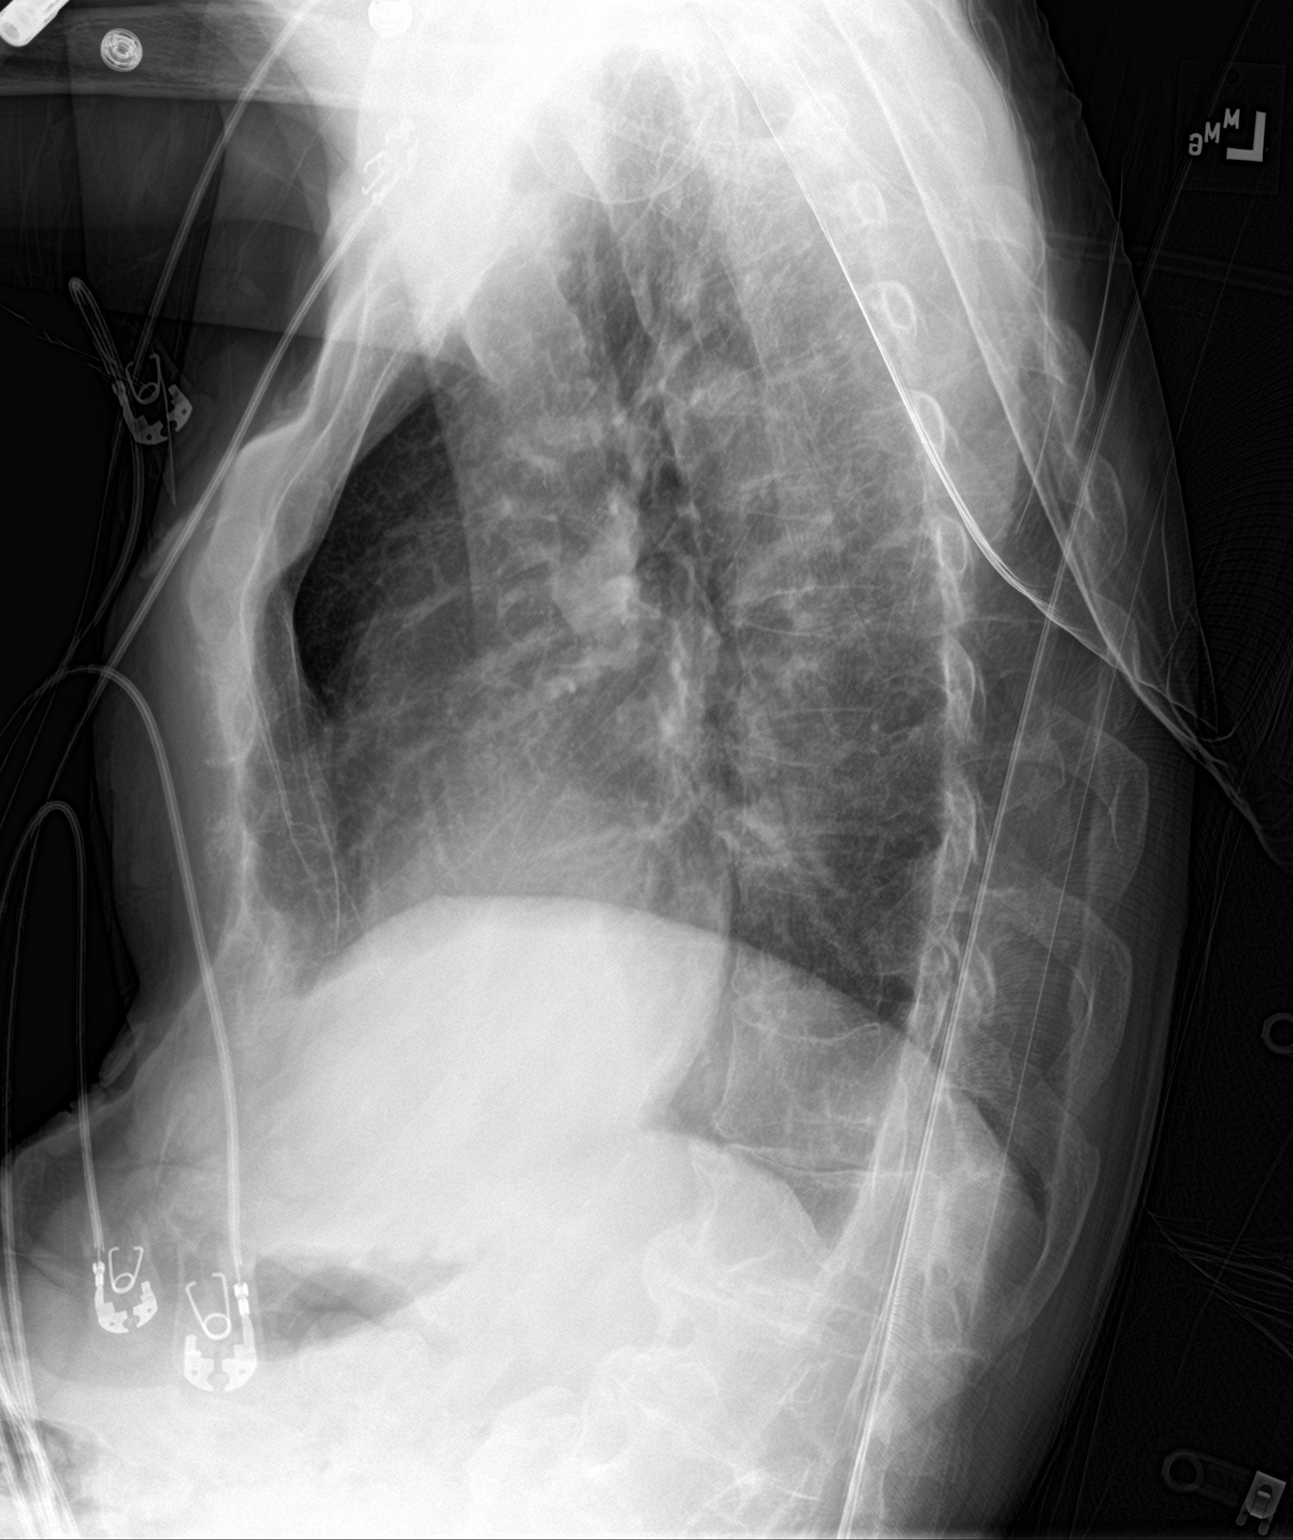

[chest ap]
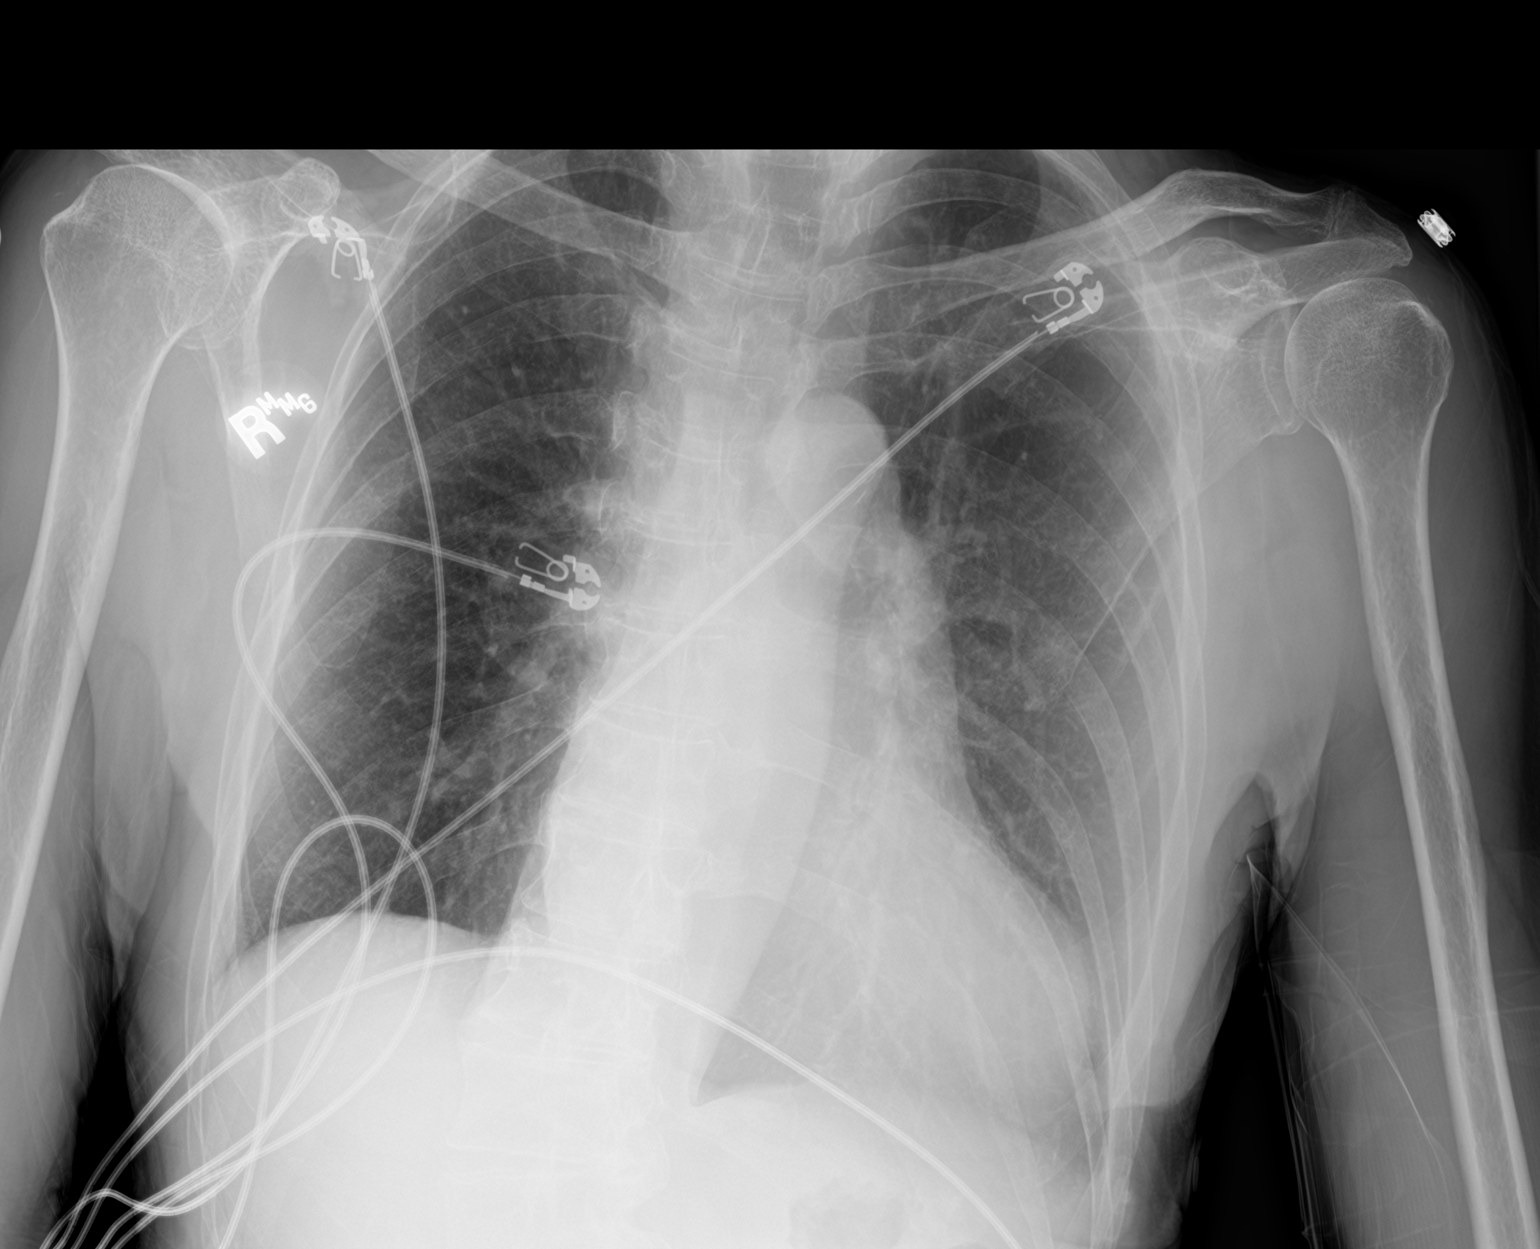

[2 of 2 positions shown; findings below may reference images not displayed]

FINDINGS: Stable heart size and mediastinal contours are within normal limits.
Both lungs are clear. The visualized skeletal structures are
unremarkable.
IMPRESSION: No acute pulmonary process identified.

## 2020-07-30 DIAGNOSIS — J9801 Acute bronchospasm: Secondary | ICD-10-CM | POA: Diagnosis not present

## 2020-07-30 DIAGNOSIS — R0602 Shortness of breath: Secondary | ICD-10-CM | POA: Diagnosis not present

## 2020-08-20 DIAGNOSIS — B351 Tinea unguium: Secondary | ICD-10-CM | POA: Diagnosis not present

## 2020-08-20 DIAGNOSIS — R079 Chest pain, unspecified: Secondary | ICD-10-CM | POA: Diagnosis not present

## 2020-09-01 ENCOUNTER — Ambulatory Visit (INDEPENDENT_AMBULATORY_CARE_PROVIDER_SITE_OTHER): Payer: Medicare Other | Admitting: Podiatry

## 2020-09-01 ENCOUNTER — Encounter: Payer: Self-pay | Admitting: Podiatry

## 2020-09-01 ENCOUNTER — Other Ambulatory Visit: Payer: Self-pay

## 2020-09-01 DIAGNOSIS — M79674 Pain in right toe(s): Secondary | ICD-10-CM

## 2020-09-01 DIAGNOSIS — B351 Tinea unguium: Secondary | ICD-10-CM

## 2020-09-01 DIAGNOSIS — M79675 Pain in left toe(s): Secondary | ICD-10-CM | POA: Diagnosis not present

## 2020-09-03 ENCOUNTER — Ambulatory Visit (HOSPITAL_COMMUNITY)
Admission: EM | Admit: 2020-09-03 | Discharge: 2020-09-03 | Disposition: A | Discharge status: Home / Self Care | Payer: Medicare Other | Attending: Medical Oncology | Admitting: Medical Oncology

## 2020-09-03 ENCOUNTER — Other Ambulatory Visit: Payer: Self-pay

## 2020-09-03 ENCOUNTER — Encounter (HOSPITAL_COMMUNITY): Payer: Self-pay

## 2020-09-03 ENCOUNTER — Emergency Department (HOSPITAL_COMMUNITY)
Admission: EM | Admit: 2020-09-03 | Discharge: 2020-09-03 | Disposition: A | Discharge status: Home / Self Care | Payer: Medicare Other | Attending: Emergency Medicine | Admitting: Emergency Medicine

## 2020-09-03 ENCOUNTER — Emergency Department (HOSPITAL_COMMUNITY): Payer: Medicare Other

## 2020-09-03 ENCOUNTER — Encounter (HOSPITAL_COMMUNITY): Payer: Self-pay | Admitting: *Deleted

## 2020-09-03 DIAGNOSIS — R413 Other amnesia: Secondary | ICD-10-CM | POA: Insufficient documentation

## 2020-09-03 DIAGNOSIS — G4700 Insomnia, unspecified: Secondary | ICD-10-CM

## 2020-09-03 DIAGNOSIS — R531 Weakness: Secondary | ICD-10-CM | POA: Diagnosis not present

## 2020-09-03 DIAGNOSIS — G479 Sleep disorder, unspecified: Secondary | ICD-10-CM | POA: Diagnosis not present

## 2020-09-03 DIAGNOSIS — Z7982 Long term (current) use of aspirin: Secondary | ICD-10-CM | POA: Diagnosis not present

## 2020-09-03 DIAGNOSIS — R519 Headache, unspecified: Secondary | ICD-10-CM | POA: Diagnosis not present

## 2020-09-03 DIAGNOSIS — Z87820 Personal history of traumatic brain injury: Secondary | ICD-10-CM

## 2020-09-03 LAB — URINALYSIS, ROUTINE W REFLEX MICROSCOPIC
Bilirubin Urine: NEGATIVE
Glucose, UA: NEGATIVE mg/dL
Hgb urine dipstick: NEGATIVE
Ketones, ur: NEGATIVE mg/dL
Leukocytes,Ua: NEGATIVE
Nitrite: NEGATIVE
Protein, ur: NEGATIVE mg/dL
Specific Gravity, Urine: 1.01 (ref 1.005–1.030)
pH: 7 (ref 5.0–8.0)

## 2020-09-03 LAB — CBC WITH DIFFERENTIAL/PLATELET
Abs Immature Granulocytes: 0.01 10*3/uL (ref 0.00–0.07)
Basophils Absolute: 0 10*3/uL (ref 0.0–0.1)
Basophils Relative: 1 %
Eosinophils Absolute: 0.1 10*3/uL (ref 0.0–0.5)
Eosinophils Relative: 3 %
HCT: 42.4 % (ref 39.0–52.0)
Hemoglobin: 13.9 g/dL (ref 13.0–17.0)
Immature Granulocytes: 0 %
Lymphocytes Relative: 21 %
Lymphs Abs: 0.9 10*3/uL (ref 0.7–4.0)
MCH: 33.3 pg (ref 26.0–34.0)
MCHC: 32.8 g/dL (ref 30.0–36.0)
MCV: 101.4 fL — ABNORMAL HIGH (ref 80.0–100.0)
Monocytes Absolute: 0.5 10*3/uL (ref 0.1–1.0)
Monocytes Relative: 11 %
Neutro Abs: 2.8 10*3/uL (ref 1.7–7.7)
Neutrophils Relative %: 64 %
Platelets: 209 10*3/uL (ref 150–400)
RBC: 4.18 MIL/uL — ABNORMAL LOW (ref 4.22–5.81)
RDW: 13 % (ref 11.5–15.5)
WBC: 4.3 10*3/uL (ref 4.0–10.5)
nRBC: 0 % (ref 0.0–0.2)

## 2020-09-03 LAB — COMPREHENSIVE METABOLIC PANEL
ALT: 23 U/L (ref 0–44)
AST: 29 U/L (ref 15–41)
Albumin: 4.1 g/dL (ref 3.5–5.0)
Alkaline Phosphatase: 69 U/L (ref 38–126)
Anion gap: 6 (ref 5–15)
BUN: 22 mg/dL (ref 8–23)
CO2: 28 mmol/L (ref 22–32)
Calcium: 9.4 mg/dL (ref 8.9–10.3)
Chloride: 104 mmol/L (ref 98–111)
Creatinine, Ser: 0.99 mg/dL (ref 0.61–1.24)
GFR, Estimated: >60 mL/min (ref >60)
Glucose, Bld: 100 mg/dL — ABNORMAL HIGH (ref 70–99)
Potassium: 4.5 mmol/L (ref 3.5–5.1)
Sodium: 138 mmol/L (ref 135–145)
Total Bilirubin: 0.6 mg/dL (ref 0.3–1.2)
Total Protein: 6.8 g/dL (ref 6.5–8.1)

## 2020-09-03 NOTE — ED Notes (Signed)
Patient Alert and oriented to baseline. Stable and ambulatory to baseline. Patient verbalized understanding of the discharge instructions.  Patient belongings were taken by the patient.   

## 2020-09-03 NOTE — ED Triage Notes (Signed)
Pt reports he does not feel good but nothing specific . Pt also reported he can not sleep.

## 2020-09-03 NOTE — ED Provider Notes (Signed)
Emergency Medicine Provider Triage Evaluation Note  David Scott 80 y.o. male was evaluated in triage.  Pt complains of having difficulty sleeping.  Patient is a poor historian.  He states that over the last 2 days, he has had trouble sleeping.  He went to sleep at about 12-1 AM this morning woke up 6.  He states that this not enough sleep for him and states that usually more.  He went to urgent care and was evaluated there.  At urgent care, he was evaluated and did mention a fall about a week or 2 ago.  He was sent to the ED for further evaluation.  He denies any fevers, chest pain, difficulty breathing, abdominal pain, nausea/vomiting   Review of Systems  Positive: Insomnia, fall Negative: Fever, chest pain, difficulty breathing, abdominal pain, nausea/vomiting.  Physical Exam  BP 134/82   Pulse 70   Temp 98.2 F (36.8 C) (Oral)   Resp 18   Ht 5\' 4"  (1.626 m)   Wt 65.8 kg   SpO2 100%   BMI 24.89 kg/m  Gen:   Awake, no distress   HEENT:  Atraumatic  Resp:  Normal effort  Cardiac:  Normal rate  Abd:   Nondistended, nontender  MSK:   Moves extremities without difficulty  Neuro:  Speech clear.  Alert and oriented x3.  5/5 strength bilateral upper and lower extremity.  Other:   N/A   Medical Decision Making  Medically screening exam initiated at 2:18 PM  Appropriate orders placed.  was informed that the remainder of the evaluation will be completed by another provider, this initial triage assessment does not replace that evaluation, and the importance of remaining in the ED until their evaluation is complete.    Clinical Impression  Insomnia, fall.   Portions of this note were generated with [name]. Dictation errors may occur despite best attempts at proofreading.     Scientist, clinical (histocompatibility and immunogenetics), PA-C 09/03/20 1419    11/03/20, MD 09/03/20 1630

## 2020-09-03 NOTE — ED Triage Notes (Signed)
Pt reports he is here today because he doesn't feel well.

## 2020-09-03 NOTE — Discharge Instructions (Addendum)
What is insomnia? -- Insomnia is a problem with sleep. People with insomnia have trouble falling or staying asleep, or they do not feel rested when they wake up. Insomnia is not about the number of hours of sleep a person gets. Everyone needs a different amount of sleep. Short-term insomnia is when a person has trouble sleeping for a few days or weeks. This is usually related to temporary stress and often gets better on its own. Long-term or "chronic" insomnia is when sleep problems last for 3 months or longer. What are the symptoms of insomnia? -- People with insomnia often: ?Have trouble falling or staying asleep ?Feel tired during the day ?Forget things or have trouble thinking clearly ?Get cranky, anxious, irritable, or depressed ?Have less energy or interest in doing things ?Make mistakes or get into accidents more often than normal ?Worry about their lack of sleep These symptoms can be so bad that they affect a person's relationships or work life. They can happen even in people who seem to be sleeping enough hours. Are there tests I should have? -- Probably not. Most people with insomnia need no tests. Your doctor or nurse will probably be able to tell what is wrong just by talking to you. They might also ask you to keep a daily log for 1 to 2 weeks, where you keep track of how you sleep each night. In some cases, people do need special sleep tests, such as "polysomnography" or "actigraphy." ?Polysomnography - Polysomnography is a test that usually lasts all night. It can be done in a sleep lab or in your home. During the test, monitors are attached to your body to record movement, brain activity, breathing, and other body functions. ?Actigraphy - Actigraphy records activity and movement with a monitor or motion detector that is usually worn on the wrist. The test is done at home, over several days and nights. It will record how much you actually sleep and when. Should I see a doctor or nurse?  -- Yes. If you have insomnia, and it is troubling you, see your doctor or nurse. They might have suggestions for how to treat the problem. How is insomnia treated? -- It depends. If your insomnia is related to stress, pain, or a medical problem, treating that problem can help you sleep better. If you have chronic insomnia, meaning insomnia that lasts longer than 3 months, there are specific treatments that can help. They include: ?Cognitive behavioral therapy - Cognitive behavioral therapy for insomnia, or "CBT-I," involves working with a counselor or therapist over several weeks. You will work on understanding your insomnia, learning ways to build better sleep habits, and changing negative thinking patterns that can make insomnia worse. Your therapist can also teach you relaxation exercises that can help. Part of CBT-I involves learning about "sleep hygiene." These things can also be helpful for people who don't have chronic insomnia but have trouble sleeping sometimes. Having good sleep hygiene means you: Sleep only long enough to feel rested and then get out of bed Go to bed and get up at the same time every day Do not try to force yourself to sleep. If you can't sleep, get out of bed and try again later. Have coffee, tea, and other foods that have caffeine only in the morning Avoid alcohol in the late afternoon, evening, and bedtime Avoid smoking, especially in the evening Keep your bedroom dark, cool, quiet, and free of reminders of work or other things that cause you stress Solve problems  you have before you go to bed Get plenty of physical activity, but avoid heavy exercise right before bed Avoid looking at phones, computer screens, or reading devices ("e-books") that give off light before bed. This can make it harder to fall asleep. ?Medicines - There are also medicines that can help with sleep. But doctors usually recommend trying cognitive behavioral therapy first. In some cases, they might  recommend starting both at the same time. If your doctor or nurse thinks medicine might help you, they will talk to you about the benefits and risks. Doctors generally do not recommend over-the-counter "sleep aids" for treating chronic insomnia. If your insomnia is related to problems like depression or anxiety, it can help to treat those problems directly. Can I use alcohol to help me sleep? -- No, do not use alcohol as a sleep aid. Even though alcohol makes you sleepy at first, it disrupts sleep later in the night.

## 2020-09-03 NOTE — ED Notes (Signed)
Patient left without being swabbed for Covid

## 2020-09-03 NOTE — ED Provider Notes (Signed)
[facility] EMERGENCY DEPARTMENT Provider Note   CSN: [id number] Arrival date & time: 09/03/20  1350     History No chief complaint on file.   David Scott is a 80 y.o. male presenting for evaluation of insomnia.   Level 5 caveat due to memory loss  Pt states for the past 2 days he has had increased difficulty sleeping. He does not know if he has difficulty falling asleep or staying asleep.  He otherwise reports he is feeling okay.  He does report that he had a 1 to 2 weeks ago, he did hit his head.  He did not lose consciousness.  No recent change in diet or activity.  No recent change in medications.  He has not followed up with his primary care doctor.  I also discussed with patient's son, [name], who confirms that patient is sleeping.  Son is not quite sure why this is happening, but thinks it may be due to noise.  Patient lives at home with his 2 sons.  He does not use a walker or cane.  Additional history obtained per chart review.  Patient with a history of memory loss, previous CVA, gait disturbance  HPI     Past Medical History:  Diagnosis Date  . CVA (cerebral vascular accident) (HCC)   . Esophageal reflux   . Leg fracture     Patient Active Problem List   Diagnosis Date Noted  . Supranuclear palsy (HCC) 05/10/2018  . Stroke (HCC) 04/12/2017  . Memory loss 02/03/2017  . Gait disturbance 02/03/2017  . Polyneuropathy 02/03/2017  . Numbness 02/03/2017    History reviewed. No pertinent surgical history.     Family History  Problem Relation Age of Onset  . COPD Father     Social History   Tobacco Use  . Smoking status: Never Smoker  . Smokeless tobacco: Never Used  Substance Use Topics  . Alcohol use: No  . Drug use: No    Home Medications Prior to Admission medications   Medication Sig Start Date End Date Taking? Authorizing Provider  albuterol (PROVENTIL HFA;VENTOLIN HFA) 108 (90 Base) MCG/ACT inhaler Inhale 2 puffs into the  lungs every 6 (six) hours as needed for wheezing or shortness of breath.  09/09/17   [provider]  aspirin 81 MG tablet Take 81 mg by mouth daily after lunch.     [provider]  Cholecalciferol (VITAMIN D-3 PO) Take 1 capsule by mouth daily after lunch.     [provider]  Magnesium 250 MG TABS Take 250 mg by mouth daily.    [provider]  Multiple Vitamin (MULTIVITAMIN) tablet Take 1 tablet by mouth 2 (two) times daily after a meal.     [provider]  vitamin C (ASCORBIC ACID) 500 MG tablet Take 1,000 mg by mouth daily.    [provider]    Allergies    Septra [sulfamethoxazole-trimethoprim]  Review of Systems   Review of Systems  Psychiatric/Behavioral: Positive for sleep disturbance.  All other systems reviewed and are negative.   Physical Exam Updated Vital Signs BP (!) 176/76 (BP Location: Right Arm)   Pulse 60   Temp 98 F (36.7 C) (Oral)   Resp 18   SpO2 99%   Physical Exam Vitals and nursing note reviewed.  Constitutional:      General: He is not in acute distress.    Appearance: He is well-developed.     Comments: Resting in the chair  in NAD  HENT:     Head: Normocephalic and atraumatic.  Eyes:     Conjunctiva/sclera: Conjunctivae normal.     Pupils: Pupils are equal, round, and reactive to light.  Cardiovascular:     Rate and Rhythm: Normal rate and regular rhythm.  Pulmonary:     Effort: Pulmonary effort is normal. No respiratory distress.     Breath sounds: Normal breath sounds. No wheezing.  Abdominal:     General: There is no distension.     Palpations: Abdomen is soft. There is no mass.     Tenderness: There is no abdominal tenderness. There is no guarding or rebound.  Musculoskeletal:        General: Normal range of motion.     Cervical back: Normal range of motion and neck supple.  Skin:    General: Skin is warm and dry.     Capillary Refill: Capillary refill takes less than 2 seconds.   Neurological:     Mental Status: He is alert and oriented to person, place, and time.     ED Results / Procedures / Treatments   Labs (all labs ordered are listed, but only abnormal results are displayed) Labs Reviewed  CBC WITH DIFFERENTIAL/PLATELET - Abnormal; Notable for the following components:      Result Value   RBC 4.18 (*)    MCV 101.4 (*)    All other components within normal limits  COMPREHENSIVE METABOLIC PANEL - Abnormal; Notable for the following components:   Glucose, Bld 100 (*)    All other components within normal limits  URINALYSIS, ROUTINE W REFLEX MICROSCOPIC    EKG None  Radiology CT Head Wo Contrast  Result Date: 09/03/2020 CLINICAL DATA:  Trauma.  Generalized weakness. EXAM: CT HEAD WITHOUT CONTRAST TECHNIQUE: Contiguous axial images were obtained from the base of the skull through the vertex without intravenous contrast. COMPARISON:  02/14/2018 FINDINGS: Brain: No evidence of acute infarction, hemorrhage, hydrocephalus, extra-axial collection or mass lesion/mass effect. There is mild diffuse low-attenuation within the subcortical and periventricular white matter compatible with chronic microvascular disease. Encephalomalacia within the inferior left frontal lobe is again noted compatible with remote infarct. Vascular: No hyperdense vessel or unexpected calcification. Skull: Normal. Negative for fracture or focal lesion. Sinuses/Orbits: No acute finding. Other: None. IMPRESSION: 1. No acute intracranial abnormalities. 2. Remote infarct within the inferior left frontal lobe. 3. Chronic small vessel ischemic change. Electronically Signed   By: Signa Kell M.D.   On: 09/03/2020 16:16    Procedures Procedures   Medications Ordered in ED Medications - No data to display  ED Course  I have reviewed the triage vital signs and the nursing notes.  Pertinent labs & imaging results that were available during my care of the patient were reviewed by me and  considered in my medical decision making (see chart for details).    MDM Rules/Calculators/A&P                          Patient presenting for evaluation of sleep difficulty.  On exam, patient appears nontoxic.  He is alert and oriented.  Labs obtained from triage interpreted by me, overall reassuring.  CT head obtained from triage negative for acute findings including signs of concerning trauma.  Urine is pending.  Discussed findings with patient and son.  Discussed that at this time does not appear to be acute or life-threatening event requiring admission.  I encouraged follow-up with primary care.  I discussed that if noise is a concern, to try a sound machine.  I discussed sleep hygiene.  Discussed decreasing fluid intake for several hours prior to sleep to prevent urinary frequency.  At this time, patient appears safe for discharge.  Return precautions given.  Patient and son state they understand and agree to plan.  Final Clinical Impression(s) / ED Diagnoses Final diagnoses:  Sleep difficulties    Rx / DC Orders ED Discharge Orders    None       Alveria Apley, PA-C 09/03/20 2007    Tegeler, Canary Brim, MD 09/03/20 2333

## 2020-09-03 NOTE — Progress Notes (Signed)
Subjective:   Patient ID: David Scott, male   DOB: 80 y.o.   MRN: [id number]   HPI Patient presents stating he has severely thickened nailbeds 1-5 both feet with the hallux being especially involved and states that they do get tender and thick he cannot cut them and he wants to know about the possibility for removal.  Patient does not smoke likes to be active   Review of Systems  All other systems reviewed and are negative.       Objective:  Physical Exam Vitals and nursing note reviewed.  Constitutional:      Appearance: He is well-developed.  Pulmonary:     Effort: Pulmonary effort is normal.  Musculoskeletal:        General: Normal range of motion.  Skin:    General: Skin is warm.  Neurological:     Mental Status: He is alert.     Neurovascular status was found to be intact muscle strength is moderately diminished and diminishment of sharp dull vibratory.  Gross alert moderate in balance with gait and is found to have thick yellow brittle nailbeds 1-5 both feet with the hallux nails being very dystrophic and I did note slightly shiny skin and diminished hair growth bilateral     Assessment:  Patient with moderate gait issues history of stroke with mycotic nail infection 1-5 both feet that become tender due to the length and thickness and there may be mild vascular disease     Plan:  H&P reviewed with Dorinda Hill do not recommend permanent procedures now slightly concerned about healing and I went ahead and I debrided nailbeds 1-5 both feet no iatrogenic bleeding reappoint routine care

## 2020-09-03 NOTE — ED Provider Notes (Signed)
[facility]    CSN: [id number] Arrival date & time: 09/03/20  1052      History   Chief Complaint Chief Complaint  Patient presents with  . PT has not felt as good as usual no specific cpmplaint  . Insomnia    HPI David Scott is a 80 y.o. male.   Patient's son [name] who is his healthcare power of attorney according to patient accompanied him today to help with history  HPI   Insomnia: Pt reports that he is having trouble sleeping.  He reports that he was able to sleep from about 2 AM until 6 AM last night but this is unusual for him.  He also states that his head feels "weird ". He has had this symptom for 2 days.  He denies any specific pain of his head but he states that he feels that something is wrong in his head.  He does have a history of a CVA but he is not able to give me much history in terms of this.  All other questions he is able to answer appropriately but with a delay. When son arrived to the room to help with history he mentioned that his father has had generalized worsening symptoms of "old age" over the past few weeks. Nothing specific. When asked about head injury or falls patient states that he fell almost 2 weeks ago and hit the back of his head during this fall. Son was aware of the fall but unaware of him hitting his head. No vomiting, weakness, slurred speech, facial weakness. He reports that he has not taken anything for symptoms.   Past Medical History:  Diagnosis Date  . CVA (cerebral vascular accident) (HCC)   . Esophageal reflux   . Leg fracture     Patient Active Problem List   Diagnosis Date Noted  . Supranuclear palsy (HCC) 05/10/2018  . Stroke (HCC) 04/12/2017  . Memory loss 02/03/2017  . Gait disturbance 02/03/2017  . Polyneuropathy 02/03/2017  . Numbness 02/03/2017    History reviewed. No pertinent surgical history.   Home Medications    Prior to Admission medications   Medication Sig Start Date End Date Taking?  Authorizing Provider  albuterol (PROVENTIL HFA;VENTOLIN HFA) 108 (90 Base) MCG/ACT inhaler Inhale 2 puffs into the lungs every 6 (six) hours as needed for wheezing or shortness of breath.  09/09/17   [provider]  aspirin 81 MG tablet Take 81 mg by mouth daily after lunch.     [provider]  Cholecalciferol (VITAMIN D-3 PO) Take 1 capsule by mouth daily after lunch.     [provider]  Magnesium 250 MG TABS Take 250 mg by mouth daily.    [provider]  Multiple Vitamin (MULTIVITAMIN) tablet Take 1 tablet by mouth 2 (two) times daily after a meal.     [provider]  vitamin C (ASCORBIC ACID) 500 MG tablet Take 1,000 mg by mouth daily.    [provider]    Family History Family History  Problem Relation Age of Onset  . COPD Father     Social History Social History   Tobacco Use  . Smoking status: Never Smoker  . Smokeless tobacco: Never Used  Substance Use Topics  . Alcohol use: No  . Drug use: No     Allergies   Septra [sulfamethoxazole-trimethoprim]   Review of Systems Review of Systems  As stated above in HPI Physical Exam Triage Vital Signs ED  Triage Vitals  Enc Vitals Group     BP 09/03/20 1244 (!) 147/103     Pulse Rate 09/03/20 1244 (!) 59     Resp 09/03/20 1244 18     Temp 09/03/20 1244 98.2 F (36.8 C)     Temp Source 09/03/20 1244 Oral     SpO2 09/03/20 1244 100 %     Weight --      Height --      Head Circumference --      Peak Flow --      Pain Score 09/03/20 1241 0     Pain Loc --      Pain Edu? --      Excl. in GC? --    No data found.  Updated Vital Signs BP (!) 147/103 (BP Location: Left Arm)   Pulse (!) 59   Temp 98.2 F (36.8 C) (Oral)   Resp 18   SpO2 100%   Physical Exam Vitals and nursing note reviewed.  Constitutional:      General: He is not in acute distress.    Appearance: Normal appearance. He is not ill-appearing, toxic-appearing or diaphoretic.     Comments:  Slow to answer questions  HENT:     Head: Normocephalic and atraumatic.     Nose: Nose normal.  Eyes:     General:        Right eye: No discharge.        Left eye: No discharge.     Pupils: Pupils are equal, round, and reactive to light.     Comments: Mild EOM tracking difficulty   Neck:     Vascular: No carotid bruit.  Cardiovascular:     Rate and Rhythm: Regular rhythm. Bradycardia present.     Pulses: Normal pulses.     Heart sounds: Normal heart sounds.  Pulmonary:     Effort: Pulmonary effort is normal.     Breath sounds: Normal breath sounds.  Abdominal:     Palpations: Abdomen is soft.  Musculoskeletal:     Cervical back: Normal range of motion and neck supple.  Skin:    General: Skin is warm.     Capillary Refill: Capillary refill takes less than 2 seconds.     Coloration: Skin is not jaundiced or pale.     Findings: No bruising.  Neurological:     Mental Status: He is alert and oriented to person, place, and time.     Cranial Nerves: No cranial nerve deficit.     Sensory: No sensory deficit.     Motor: No weakness.     Coordination: Coordination normal.     Gait: Gait abnormal (slow shuffled).     Deep Tendon Reflexes: Reflexes normal.  Psychiatric:        Mood and Affect: Mood normal.        Behavior: Behavior normal.      UC Treatments / Results  Labs (all labs ordered are listed, but only abnormal results are displayed) Labs Reviewed  SARS CORONAVIRUS 2 (TAT 6-24 HRS)    EKG   Radiology No results found.  Procedures Procedures (including critical care time)  Medications Ordered in UC Medications - No data to display  Initial Impression / Assessment and Plan / UC Course  I have reviewed the triage vital signs and the nursing notes.  Pertinent labs & imaging results that were available during my care of the patient were reviewed by me and considered in my medical decision  making (see chart for details).     New.  I discussed with patient  and his son my concerns regarding his recent fall and head trauma followed by his current symptoms that he has had for the past 2 days.  He will need to be evaluated in the emergency room for head imaging to rule out subdural hematoma.  They are agreeable.  Patient wishes to travel via private vehicle driven by his son. Final Clinical Impressions(s) / UC Diagnoses   Final diagnoses:  None   Discharge Instructions   None    ED Prescriptions    None     PDMP not reviewed this encounter.   Rushie Chestnut, [place] 09/03/20 1347

## 2020-09-03 NOTE — ED Notes (Signed)
Patient is being discharged from the Urgent Care and sent to the Emergency Department via POV . Per Clent Jacks PA, patient is in need of higher level of care due to headache with history of head trauma. Patient is aware and verbalizes understanding of plan of care.  Vitals:   09/03/20 1244  BP: (!) 147/103  Pulse: (!) 59  Resp: 18  Temp: 98.2 F (36.8 C)  SpO2: 100%

## 2020-09-11 ENCOUNTER — Ambulatory Visit (INDEPENDENT_AMBULATORY_CARE_PROVIDER_SITE_OTHER): Payer: Medicare Other

## 2020-09-11 ENCOUNTER — Ambulatory Visit (HOSPITAL_COMMUNITY)
Admission: EM | Admit: 2020-09-11 | Discharge: 2020-09-11 | Disposition: A | Discharge status: Home / Self Care | Payer: Medicare Other | Attending: Internal Medicine | Admitting: Internal Medicine

## 2020-09-11 ENCOUNTER — Encounter (HOSPITAL_COMMUNITY): Payer: Self-pay

## 2020-09-11 DIAGNOSIS — R0602 Shortness of breath: Secondary | ICD-10-CM | POA: Diagnosis not present

## 2020-09-11 NOTE — ED Triage Notes (Incomplete)
Pt reports chest tightness and shortness of breath on exertion x 3 months. States the chest tightness got worse this morning. Denies dizziness, vision changes nausea, headache.

## 2020-09-11 NOTE — ED Provider Notes (Signed)
[facility]    CSN: [id number] Arrival date & time: 09/11/20  1757      History   Chief Complaint Chief Complaint  Patient presents with  . Chest Pain  . Shortness of Breath    HPI David Scott is a 80 y.o. male comes to the urgent care with shortness of breath which started a few days ago.  Patient endorses an insidious onset of shortness of breath both at rest and on exertion. Marland Kitchen  He denies chest pain.  No chest pressure.  No fever or chills.  No cough or sputum production.  Patient has a history of chronic lung disease and he uses albuterol inhaler on a regular basis.  He has been using the inhaler about 2 times daily.  No dizziness, near syncope or syncopal episodes.  No sick contacts. Past Medical History:  Diagnosis Date  . CVA (cerebral vascular accident) (HCC)   . Esophageal reflux   . Leg fracture     Patient Active Problem List   Diagnosis Date Noted  . Supranuclear palsy (HCC) 05/10/2018  . Stroke (HCC) 04/12/2017  . Memory loss 02/03/2017  . Gait disturbance 02/03/2017  . Polyneuropathy 02/03/2017  . Numbness 02/03/2017    History reviewed. No pertinent surgical history.     Home Medications    Prior to Admission medications   Medication Sig Start Date End Date Taking? Authorizing Provider  albuterol (PROVENTIL HFA;VENTOLIN HFA) 108 (90 Base) MCG/ACT inhaler Inhale 2 puffs into the lungs every 6 (six) hours as needed for wheezing or shortness of breath.  09/09/17   [provider]  aspirin 81 MG tablet Take 81 mg by mouth daily after lunch.     [provider]  Cholecalciferol (VITAMIN D-3 PO) Take 1 capsule by mouth daily after lunch.     [provider]  Magnesium 250 MG TABS Take 250 mg by mouth daily.    [provider]  Multiple Vitamin (MULTIVITAMIN) tablet Take 1 tablet by mouth 2 (two) times daily after a meal.     [provider]  vitamin C (ASCORBIC ACID) 500 MG tablet Take 1,000 mg by  mouth daily.    [provider]    Family History Family History  Problem Relation Age of Onset  . COPD Father     Social History Social History   Tobacco Use  . Smoking status: Never Smoker  . Smokeless tobacco: Never Used  Substance Use Topics  . Alcohol use: No  . Drug use: No     Allergies   Benzonatate, Tamsulosin hcl, and Septra [sulfamethoxazole-trimethoprim]   Review of Systems Review of Systems  HENT: Negative.   Respiratory: Positive for shortness of breath. Negative for cough, wheezing and stridor.   Cardiovascular: Negative.   Gastrointestinal: Negative.   Neurological: Negative.      Physical Exam Triage Vital Signs ED Triage Vitals [09/11/20 1806]  Enc Vitals Group     BP (!) 167/78     Pulse Rate (!) 59     Resp (!) 26     Temp 98.7 F (37.1 C)     Temp Source Oral     SpO2 98 %     Weight      Height      Head Circumference      Peak Flow      Pain Score      Pain Loc      Pain Edu?  Excl. in GC?    No data found.  Updated Vital Signs BP (!) 176/63 (BP Location: Right Arm)   Pulse (!) 55   Temp 98.7 F (37.1 C) (Oral)   Resp (!) 24   SpO2 98%   Visual Acuity Right Eye Distance:   Left Eye Distance:   Bilateral Distance:    Right Eye Near:   Left Eye Near:    Bilateral Near:     Physical Exam Vitals and nursing note reviewed.  Cardiovascular:     Rate and Rhythm: Normal rate and regular rhythm.  Pulmonary:     Effort: Pulmonary effort is normal.     Breath sounds: Normal breath sounds. No decreased breath sounds, wheezing or rhonchi.  Abdominal:     General: Bowel sounds are normal.     Palpations: Abdomen is soft.  Neurological:     Mental Status: He is alert.      UC Treatments / Results  Labs (all labs ordered are listed, but only abnormal results are displayed) Labs Reviewed - No data to display  EKG   Radiology DG Chest 2 View  Result Date: 09/11/2020 CLINICAL DATA:  Shortness of  breath EXAM: CHEST - 2 VIEW COMPARISON:  07/30/2020 FINDINGS: Cardiac shadow is stable. Aortic calcifications are again noted. The lungs are well aerated without focal infiltrate or sizable effusion. Degenerative changes of the thoracic spine are noted. Findings suspicious of prior sternal fracture with healing are noted as well. IMPRESSION: Chronic changes without acute abnormality. Electronically Signed   By: Alcide Clever M.D.   On: 09/11/2020 19:12    Procedures Procedures (including critical care time)  Medications Ordered in UC Medications - No data to display  Initial Impression / Assessment and Plan / UC Course  I have reviewed the triage vital signs and the nursing notes.  Pertinent labs & imaging results that were available during my care of the patient were reviewed by me and considered in my medical decision making (see chart for details).     1.  Shortness of breath: Chest x-ray is negative for acute lung infiltrate Continue bronchodilator treatments Follow-up with PCP If symptoms worsen please return to urgent care to be reevaluated Final Clinical Impressions(s) / UC Diagnoses   Final diagnoses:  Shortness of breath     Discharge Instructions     Continue to use albuterol inhaler as directed If you have any worsening symptoms please reach out to your primary care physician to be reevaluated If you have worsening shortness of breath, chest pain, chest pressure or altered mentation please take the patient to the emergency room to be evaluated further.    ED Prescriptions    None     PDMP not reviewed this encounter.   Merrilee Jansky, MD 09/12/20 [phone]

## 2020-09-11 NOTE — Discharge Instructions (Addendum)
Continue to use albuterol inhaler as directed If you have any worsening symptoms please reach out to your primary care physician to be reevaluated If you have worsening shortness of breath, chest pain, chest pressure or altered mentation please take the patient to the emergency room to be evaluated further.

## 2020-09-20 ENCOUNTER — Other Ambulatory Visit: Payer: Self-pay

## 2020-09-20 ENCOUNTER — Emergency Department (HOSPITAL_COMMUNITY): Payer: Medicare Other

## 2020-09-20 ENCOUNTER — Emergency Department (HOSPITAL_COMMUNITY)
Admission: EM | Admit: 2020-09-20 | Discharge: 2020-09-20 | Disposition: A | Discharge status: Home / Self Care | Payer: Medicare Other | Attending: Emergency Medicine | Admitting: Emergency Medicine

## 2020-09-20 ENCOUNTER — Encounter (HOSPITAL_COMMUNITY): Payer: Self-pay | Admitting: Emergency Medicine

## 2020-09-20 DIAGNOSIS — R519 Headache, unspecified: Secondary | ICD-10-CM | POA: Insufficient documentation

## 2020-09-20 DIAGNOSIS — K029 Dental caries, unspecified: Secondary | ICD-10-CM | POA: Diagnosis not present

## 2020-09-20 DIAGNOSIS — K0889 Other specified disorders of teeth and supporting structures: Secondary | ICD-10-CM | POA: Diagnosis not present

## 2020-09-20 DIAGNOSIS — Z7982 Long term (current) use of aspirin: Secondary | ICD-10-CM | POA: Diagnosis not present

## 2020-09-20 MED ORDER — ACETAMINOPHEN 325 MG PO TABS: 650.0000 mg | ORAL_TABLET | Freq: Four times a day (QID) | ORAL | 0 refills | Status: DC | PRN

## 2020-09-20 MED ORDER — ACETAMINOPHEN 325 MG PO TABS
650.0000 mg | ORAL_TABLET | Freq: Once | ORAL | Status: AC
  Administered 2020-09-20: 650 mg via ORAL
  Filled 2020-09-20: qty 2

## 2020-09-20 MED ORDER — AMOXICILLIN 500 MG PO CAPS: 500.0000 mg | ORAL_CAPSULE | Freq: Three times a day (TID) | ORAL | 0 refills | Status: DC

## 2020-09-20 MED ORDER — AMOXICILLIN 500 MG PO CAPS
500.0000 mg | ORAL_CAPSULE | Freq: Once | ORAL | Status: AC
  Administered 2020-09-20: 500 mg via ORAL
  Filled 2020-09-20: qty 1

## 2020-09-20 NOTE — ED Provider Notes (Signed)
David Scott   CSN: [id number] Arrival date & time: 09/20/20  0001     History Chief Complaint  Patient presents with  . "I feel hot"    David Scott is a 80 y.o. male.  Patient with history of stroke, currently lives at home with his son --presents to the emergency department today for evaluation of dental and facial irritation.  Patient states that when he was breathing out hot air, caused irritation to his upper mouth.  Patient has a broken upper incisor.  He states that he had a "false tooth" in his area but it fell out long time ago.  He denies any facial swelling.  The pain radiates up to the bilateral part of his face.  No difficulty speaking or talking.  No worsening shortness of breath or cough.  No fevers.  No neck pain or swelling.  The onset of this condition was acute. Currently he states pain is somewhat better.   The course is waxing and waning. Aggravating factors: none. Alleviating factors: none.          Past Medical History:  Diagnosis Date  . CVA (cerebral vascular accident) (HCC)   . Esophageal reflux   . Leg fracture     Patient Active Problem List   Diagnosis Date Noted  . Supranuclear palsy (HCC) 05/10/2018  . Stroke (HCC) 04/12/2017  . Memory loss 02/03/2017  . Gait disturbance 02/03/2017  . Polyneuropathy 02/03/2017  . Numbness 02/03/2017    History reviewed. No pertinent surgical history.     Family History  Problem Relation Age of Onset  . COPD Father     Social History   Tobacco Use  . Smoking status: Never Smoker  . Smokeless tobacco: Never Used  Substance Use Topics  . Alcohol use: No  . Drug use: No    Home Medications Prior to Admission medications   Medication Sig Start Date End Date Taking? Authorizing Provider  albuterol (PROVENTIL HFA;VENTOLIN HFA) 108 (90 Base) MCG/ACT inhaler Inhale 2 puffs into the lungs every 6 (six) hours as needed for wheezing or shortness  of breath.  09/09/17   [provider]  aspirin 81 MG tablet Take 81 mg by mouth daily after lunch.     [provider]  Cholecalciferol (VITAMIN D-3 PO) Take 1 capsule by mouth daily after lunch.     [provider]  Magnesium 250 MG TABS Take 250 mg by mouth daily.    [provider]  Multiple Vitamin (MULTIVITAMIN) tablet Take 1 tablet by mouth 2 (two) times daily after a meal.     [provider]  vitamin C (ASCORBIC ACID) 500 MG tablet Take 1,000 mg by mouth daily.    [provider]    Allergies    Benzonatate, Tamsulosin hcl, and Septra [sulfamethoxazole-trimethoprim]  Review of Systems   Review of Systems  Constitutional: Negative for fever.  HENT: Positive for dental problem. Negative for ear pain, facial swelling, sore throat and trouble swallowing.   Respiratory: Negative for shortness of breath and stridor.   Musculoskeletal: Negative for neck pain.  Skin: Negative for color change.  Neurological: Positive for headaches.    Physical Exam Updated Vital Signs BP (!) 191/89   Pulse 61   Temp 99.1 F (37.3 C) (Oral)   Resp 16   SpO2 100%   Physical Exam Vitals and nursing Scott reviewed.  Constitutional:      Appearance: He is well-developed.  HENT:     Head: Normocephalic and atraumatic.     Jaw: No trismus.     Right Ear: Tympanic membrane, ear canal and external ear normal.     Left Ear: Tympanic membrane, ear canal and external ear normal.     Nose: Nose normal.     Mouth/Throat:     Dentition: Abnormal dentition. Dental caries present. No dental abscesses.     Pharynx: Uvula midline. No uvula swelling.     Tonsils: No tonsillar abscesses.     Comments: Overall poor dentition.  Tooth #8 is broken to the gumline.  Gums are mildly inflamed, but no active swelling.  No obvious or gross abscess. Eyes:     Pupils: Pupils are equal, round, and reactive to light.  Neck:     Comments: No neck swelling or Lugwig's  angina Musculoskeletal:     Cervical back: Normal range of motion and neck supple.  Skin:    General: Skin is warm and dry.  Neurological:     Mental Status: He is alert.     ED Results / Procedures / Treatments   Labs (all labs ordered are listed, but only abnormal results are displayed) Labs Reviewed - No data to display  EKG None  Radiology CT Head Wo Contrast  Result Date: 09/20/2020 CLINICAL DATA:  Headache EXAM: CT HEAD WITHOUT CONTRAST TECHNIQUE: Contiguous axial images were obtained from the base of the skull through the vertex without intravenous contrast. COMPARISON:  09/03/2020 FINDINGS: Brain: No acute infarct or hemorrhage. Stable encephalomalacia inferior aspect left frontal lobe. Stable chronic small-vessel ischemic changes throughout the periventricular white matter. Lateral ventricles and midline structures are unremarkable. No acute extra-axial fluid collections. No mass effect. Vascular: No hyperdense vessel or unexpected calcification. Skull: Normal. Negative for fracture or focal lesion. Sinuses/Orbits: No acute finding. Other: None. IMPRESSION: 1. Stable chronic ischemic changes within the left frontal lobe and periventricular white matter. No acute intracranial process. Electronically Signed   By: Sharlet Salina M.D.   On: 09/20/2020 03:16    Procedures Procedures   Medications Ordered in ED Medications  amoxicillin (AMOXIL) capsule 500 mg (has no administration in time range)  acetaminophen (TYLENOL) tablet 650 mg (has no administration in time range)    ED Course  I have reviewed the triage vital signs and the nursing notes.  Pertinent labs & imaging results that were available during my care of the patient were reviewed by me and considered in my medical decision making (see chart for details).  9:30 AM Patient seen and examined. Medications ordered.   Vital signs reviewed and are as follows: BP (!) 191/89   Pulse 61   Temp 99.1 F (37.3 C) (Oral)    Resp 16   SpO2 100%   Patient with history of stroke.  His history is a little unclear but he does report tooth and facial discomfort that seems to be exacerbated by the air he breathes out.  There is no swelling or gross abscess.  He denies any pulmonary complaints such as cough or change in shortness of breath.  He looks well and is in no distress.  He does have a dentist and he states that he thinks he is supposed to follow-up soon.  He lives at home with his son.  We will give a course of amoxicillin and have him take Tylenol for discomfort.  Patient counseled to take prescribed medications as directed, return with worsening facial or neck swelling, and to follow-up with  their dentist as soon as possible.     MDM Rules/Calculators/A&P                          Patient presented with complaints related to breathing hot air out of his nose and mouth.  He reports dental and facial discomfort.  I think that the air blowing over the area of a broken tooth is causing pain in the exposed nerve of the tooth.  He does not have any large abscess at this time, however, I am concerned for early dental infection.  His other chronic medical problems seem to be at baseline as far as I can tell.  No signs of URI.  CT head ordered at triage was stable.  Do not feel the patient requires additional lab work-up today.  Plan as above.   Final Clinical Impression(s) / ED Diagnoses Final diagnoses:  Pain due to dental caries    Rx / DC Orders ED Discharge Orders    None       Renne Crigler, PA-C 09/20/20 1103    Tegeler, Canary Brim, MD 09/20/20 1537

## 2020-09-20 NOTE — Discharge Instructions (Signed)
Please read and follow all provided instructions.  Your diagnoses today include:  1. Pain due to dental caries     The exam and treatment you received today has been provided on an emergency basis only. This is not a substitute for complete medical or dental care.  Tests performed today include:  Vital signs. See below for your results today.   Medications prescribed:   Amoxicillin - antibiotic  You have been prescribed an antibiotic medicine: take the entire course of medicine even if you are feeling better. Stopping early can cause the antibiotic not to work.  Take any prescribed medications only as directed.  Home care instructions:  Follow any educational materials contained in this packet.  Follow-up instructions: Please follow-up with your dentist for further evaluation of your symptoms.   Please see your dentist as soon as possible for further evaluation.  Return instructions:   Please return to the Emergency Department if you experience worsening symptoms.  Please return if you develop a fever, you develop more swelling in your face or neck, you have trouble breathing or swallowing food.  Please return if you have any other emergent concerns.  Additional Information:  Your vital signs today were: BP (!) 191/89   Pulse 61   Temp 99.1 F (37.3 C) (Oral)   Resp 16   SpO2 100%  If your blood pressure (BP) was elevated above 135/85 this visit, please have this repeated by your doctor within one month. --------------

## 2020-09-20 NOTE — ED Triage Notes (Signed)
Patient stated " my head feels hot today and I'm breathing hot air" , denies SOB , hypertensive at triage .

## 2020-09-20 NOTE — ED Provider Notes (Signed)
Emergency Medicine Provider Triage Evaluation Note  David Scott , a 80 y.o. male  was evaluated in triage.  Pt complains of "I felt like I was breathing hot air out of my nostrils."  Patient states that earlier tonight that he developed "a hot feeling in his head" and felt as if he was " blowing hot air out of his nostrils."  He reports associated increased headache, that he describes as pressure-like.  He states that the headache starts in his jaw and teeth and wraps around to the back of his head and then goes all the way up through the top of his head.  He denies a history of headaches or migraines.  He states that his symptoms started after eating.  He does state that he has 1 tooth that does feel more sensitive, but he did not have increased pain while eating his meal.  He denies fever, chills, sore throat, nasal congestion or rhinorrhea, numbness, weakness, chest pain, neck pain or stiffness, visual changes.  He does state that he uses his home albuterol inhaler multiple times daily, but denies increased use and denies any shortness of breath.  He has no other complaints at this time.  No treatment prior to arrival.  No falls.  No family at bedside.  Per chart review, patient was recently seen in the ER on May 11 and was accompanied by family.  Per urgent care note family mentioned that the patient is a generalized worsening symptoms of " old age" over the last few weeks.  Nothing specific.  When asked about head injury or falls, family stated that he had fallen and was 2 weeks ago and hit the back of his head during the fall.  Review of Systems  Positive: Headache, dental pain Negative: Numbness, weakness  Physical Exam  BP (!) 204/75 (BP Location: Right Arm)   Pulse (!) 53   Temp 98.9 F (37.2 C) (Oral)   Resp 16   SpO2 99%  Gen:   Awake, no distress Resp:  Normal effort  MSK:   Moves extremities without difficulty  Other:  No reproducible tenderness palpation to the teeth.   Posterior oropharynx is unremarkable.  Moves all 4 extremities without difficulty.  Sensation is intact and equal.  Cranial nerves II through XII are grossly intact.  Mast expressions.  Affect is flat.  Speech is delayed, but patient answers to questions appropriately, but with unusual wording.  Medical Decision Making  Medically screening exam initiated at 12:36 AM.  Appropriate orders placed.  [name] was informed that the remainder of the evaluation will be completed by another provider, this initial triage assessment does not replace that evaluation, and the importance of remaining in the ED until their evaluation is complete.  On arrival to the ER, patient is hypertensive at 204/75.  Per chart review, he does not appear to have a history of hypertension.  This is markedly elevated from his last blood ER visit 2 weeks ago.  Although symptoms are very vague and could be secondary to dentalgia, with new headache 10 nights and markedly elevated blood pressure, will repeat CT imaging since the patient does have a remote history of stroke.  I do not see any focal deficits on his exam at this time.  Will defer repeat labs at this time after reviewing labs from 2 weeks ago since he has no other symptoms.  He has no shortness of breath and lungs are clear to auscultation bilaterally.  Offered  Tylenol for headache, but patient declined.  Patient will require further work-up and evaluation in the emergency department.    Shia, Eber, PA-C 09/20/20 0057    Shon Baton, MD 09/20/20 [phone]

## 2020-11-03 ENCOUNTER — Emergency Department (HOSPITAL_COMMUNITY)
Admission: EM | Admit: 2020-11-03 | Discharge: 2020-11-03 | Disposition: A | Discharge status: Home / Self Care | Payer: Medicare Other | Attending: Emergency Medicine | Admitting: Emergency Medicine

## 2020-11-03 ENCOUNTER — Emergency Department (HOSPITAL_COMMUNITY): Payer: Medicare Other

## 2020-11-03 DIAGNOSIS — Z5321 Procedure and treatment not carried out due to patient leaving prior to being seen by health care provider: Secondary | ICD-10-CM | POA: Insufficient documentation

## 2020-11-03 DIAGNOSIS — R0602 Shortness of breath: Secondary | ICD-10-CM | POA: Insufficient documentation

## 2020-11-03 DIAGNOSIS — R079 Chest pain, unspecified: Secondary | ICD-10-CM | POA: Diagnosis not present

## 2020-11-03 DIAGNOSIS — R42 Dizziness and giddiness: Secondary | ICD-10-CM | POA: Diagnosis not present

## 2020-11-03 DIAGNOSIS — R609 Edema, unspecified: Secondary | ICD-10-CM | POA: Diagnosis not present

## 2020-11-03 LAB — CBC WITH DIFFERENTIAL/PLATELET
Abs Immature Granulocytes: 0 10*3/uL (ref 0.00–0.07)
Basophils Absolute: 0 10*3/uL (ref 0.0–0.1)
Basophils Relative: 1 %
Eosinophils Absolute: 0.2 10*3/uL (ref 0.0–0.5)
Eosinophils Relative: 4 %
HCT: 41.9 % (ref 39.0–52.0)
Hemoglobin: 13.7 g/dL (ref 13.0–17.0)
Immature Granulocytes: 0 %
Lymphocytes Relative: 23 %
Lymphs Abs: 0.9 10*3/uL (ref 0.7–4.0)
MCH: 33.2 pg (ref 26.0–34.0)
MCHC: 32.7 g/dL (ref 30.0–36.0)
MCV: 101.5 fL — ABNORMAL HIGH (ref 80.0–100.0)
Monocytes Absolute: 0.5 10*3/uL (ref 0.1–1.0)
Monocytes Relative: 12 %
Neutro Abs: 2.3 10*3/uL (ref 1.7–7.7)
Neutrophils Relative %: 60 %
Platelets: 187 10*3/uL (ref 150–400)
RBC: 4.13 MIL/uL — ABNORMAL LOW (ref 4.22–5.81)
RDW: 12.7 % (ref 11.5–15.5)
WBC: 3.8 10*3/uL — ABNORMAL LOW (ref 4.0–10.5)
nRBC: 0 % (ref 0.0–0.2)

## 2020-11-03 LAB — COMPREHENSIVE METABOLIC PANEL
ALT: 20 U/L (ref 0–44)
AST: 24 U/L (ref 15–41)
Albumin: 3.8 g/dL (ref 3.5–5.0)
Alkaline Phosphatase: 57 U/L (ref 38–126)
Anion gap: 7 (ref 5–15)
BUN: 26 mg/dL — ABNORMAL HIGH (ref 8–23)
CO2: 30 mmol/L (ref 22–32)
Calcium: 9.3 mg/dL (ref 8.9–10.3)
Chloride: 100 mmol/L (ref 98–111)
Creatinine, Ser: 0.99 mg/dL (ref 0.61–1.24)
GFR, Estimated: >60 mL/min (ref >60)
Glucose, Bld: 112 mg/dL — ABNORMAL HIGH (ref 70–99)
Potassium: 4.4 mmol/L (ref 3.5–5.1)
Sodium: 137 mmol/L (ref 135–145)
Total Bilirubin: 0.8 mg/dL (ref 0.3–1.2)
Total Protein: 6.3 g/dL — ABNORMAL LOW (ref 6.5–8.1)

## 2020-11-03 LAB — TROPONIN I (HIGH SENSITIVITY): Troponin I (High Sensitivity): 11 ng/L (ref <18)

## 2020-11-03 NOTE — ED Triage Notes (Signed)
Pt from [facility] for CP/SHOB/Dizziness x4 days, worse since yesterday. EKG showed SB @ 53 Referred here for further eval/mgmt. Pain 2/10 "that's how it always is"

## 2020-11-03 NOTE — ED Notes (Signed)
This RN was notified by [facility] that pt wanted to leave. Pt advised by staff to stay & be seen, son stated that pt was tired & wanted to go home. Pt & son advised by staff that they could return at any time. Both voiced understanding, seen leaving lobby

## 2020-11-03 NOTE — ED Provider Notes (Signed)
Emergency Medicine Provider Triage Evaluation Note  David Scott , a 80 y.o. male  was evaluated in triage.  Pt complains of chest tightness. States that it is along the left anterior portion of his chest, does not radiate and has been intermittent for the past 2-3 days. States he has been under more stress recently. Sx get better when he relaxes. Reports associated SOB and lightheadedness when his sx occur. No syncope or dizziness. Seen at [facility] and sent to the ED for further evaluation. Pt is a poor historian.   Physical Exam  BP (!) 202/80 (BP Location: Right Arm)   Pulse (!) 54   Temp 98.8 F (37.1 C) (Oral)   Resp 15   SpO2 99%  Gen:   Awake, no distress   Resp:  Normal effort  MSK:   Moves extremities without difficulty  Other:    Medical Decision Making  Medically screening exam initiated at 6:18 PM.  Appropriate orders placed.  [name] was informed that the remainder of the evaluation will be completed by another provider, this initial triage assessment does not replace that evaluation, and the importance of remaining in the ED until their evaluation is complete.    Placido Sou, PA-C 11/03/20 1821    Milagros Loll, MD 11/04/20 [phone]

## 2020-11-16 ENCOUNTER — Emergency Department (HOSPITAL_COMMUNITY)
Admission: EM | Admit: 2020-11-16 | Discharge: 2020-11-16 | Disposition: A | Discharge status: Home / Self Care | Payer: Medicare Other | Attending: Emergency Medicine | Admitting: Emergency Medicine

## 2020-11-16 ENCOUNTER — Encounter (HOSPITAL_COMMUNITY): Payer: Self-pay | Admitting: Emergency Medicine

## 2020-11-16 ENCOUNTER — Emergency Department (HOSPITAL_COMMUNITY): Payer: Medicare Other

## 2020-11-16 DIAGNOSIS — Z20822 Contact with and (suspected) exposure to covid-19: Secondary | ICD-10-CM | POA: Insufficient documentation

## 2020-11-16 DIAGNOSIS — R9431 Abnormal electrocardiogram [ECG] [EKG]: Secondary | ICD-10-CM | POA: Diagnosis not present

## 2020-11-16 DIAGNOSIS — R059 Cough, unspecified: Secondary | ICD-10-CM | POA: Diagnosis not present

## 2020-11-16 DIAGNOSIS — R531 Weakness: Secondary | ICD-10-CM | POA: Diagnosis not present

## 2020-11-16 DIAGNOSIS — R4182 Altered mental status, unspecified: Secondary | ICD-10-CM | POA: Diagnosis not present

## 2020-11-16 DIAGNOSIS — Z7982 Long term (current) use of aspirin: Secondary | ICD-10-CM | POA: Diagnosis not present

## 2020-11-16 DIAGNOSIS — E860 Dehydration: Secondary | ICD-10-CM | POA: Diagnosis not present

## 2020-11-16 DIAGNOSIS — R5383 Other fatigue: Secondary | ICD-10-CM | POA: Diagnosis present

## 2020-11-16 LAB — CBC WITH DIFFERENTIAL/PLATELET
Abs Immature Granulocytes: 0.01 10*3/uL (ref 0.00–0.07)
Basophils Absolute: 0 10*3/uL (ref 0.0–0.1)
Basophils Relative: 1 %
Eosinophils Absolute: 0.1 10*3/uL (ref 0.0–0.5)
Eosinophils Relative: 4 %
HCT: 42.5 % (ref 39.0–52.0)
Hemoglobin: 14.4 g/dL (ref 13.0–17.0)
Immature Granulocytes: 0 %
Lymphocytes Relative: 23 %
Lymphs Abs: 0.9 10*3/uL (ref 0.7–4.0)
MCH: 33.8 pg (ref 26.0–34.0)
MCHC: 33.9 g/dL (ref 30.0–36.0)
MCV: 99.8 fL (ref 80.0–100.0)
Monocytes Absolute: 0.6 10*3/uL (ref 0.1–1.0)
Monocytes Relative: 15 %
Neutro Abs: 2.2 10*3/uL (ref 1.7–7.7)
Neutrophils Relative %: 57 %
Platelets: 187 10*3/uL (ref 150–400)
RBC: 4.26 MIL/uL (ref 4.22–5.81)
RDW: 12.9 % (ref 11.5–15.5)
WBC: 3.9 10*3/uL — ABNORMAL LOW (ref 4.0–10.5)
nRBC: 0 % (ref 0.0–0.2)

## 2020-11-16 LAB — TROPONIN I (HIGH SENSITIVITY)
Troponin I (High Sensitivity): 9 ng/L (ref <18)
Troponin I (High Sensitivity): 10 ng/L (ref <18)

## 2020-11-16 LAB — URINALYSIS, ROUTINE W REFLEX MICROSCOPIC
Bilirubin Urine: NEGATIVE
Glucose, UA: NEGATIVE mg/dL
Hgb urine dipstick: NEGATIVE
Ketones, ur: 5 mg/dL — AB
Leukocytes,Ua: NEGATIVE
Nitrite: NEGATIVE
Protein, ur: NEGATIVE mg/dL
Specific Gravity, Urine: 1.011 (ref 1.005–1.030)
pH: 7 (ref 5.0–8.0)

## 2020-11-16 LAB — COMPREHENSIVE METABOLIC PANEL
ALT: 21 U/L (ref 0–44)
AST: 22 U/L (ref 15–41)
Albumin: 3.8 g/dL (ref 3.5–5.0)
Alkaline Phosphatase: 59 U/L (ref 38–126)
Anion gap: 6 (ref 5–15)
BUN: 27 mg/dL — ABNORMAL HIGH (ref 8–23)
CO2: 29 mmol/L (ref 22–32)
Calcium: 9.1 mg/dL (ref 8.9–10.3)
Chloride: 100 mmol/L (ref 98–111)
Creatinine, Ser: 1.15 mg/dL (ref 0.61–1.24)
GFR, Estimated: >60 mL/min (ref >60)
Glucose, Bld: 109 mg/dL — ABNORMAL HIGH (ref 70–99)
Potassium: 4.5 mmol/L (ref 3.5–5.1)
Sodium: 135 mmol/L (ref 135–145)
Total Bilirubin: 0.8 mg/dL (ref 0.3–1.2)
Total Protein: 6.4 g/dL — ABNORMAL LOW (ref 6.5–8.1)

## 2020-11-16 LAB — RESP PANEL BY RT-PCR (FLU A&B, COVID) ARPGX2
Influenza A by PCR: NEGATIVE
Influenza B by PCR: NEGATIVE
SARS Coronavirus 2 by RT PCR: NEGATIVE

## 2020-11-16 MED ORDER — SODIUM CHLORIDE 0.9 % IV BOLUS
1000.0000 mL | Freq: Once | INTRAVENOUS | Status: AC
  Administered 2020-11-16: 1000 mL via INTRAVENOUS

## 2020-11-16 NOTE — ED Notes (Signed)
ED Provider at bedside. 

## 2020-11-16 NOTE — ED Triage Notes (Signed)
C/o generalized weakness since yesterday.  Denies pain.  Denies SOB.

## 2020-11-16 NOTE — ED Provider Notes (Signed)
Emergency Medicine Provider Triage Evaluation Note  David Scott , a 80 y.o. male  was evaluated in triage.  Pt complains of generalized weakness.  Has been worse in the last 1 to 2 days.  To the point where he does not feel he can walk.  He reports a mild cough.  No new urinary symptoms.  He lives at home with his sons.  Review of Systems  Positive: weakness Negative: fever  Physical Exam  BP (!) 151/75   Pulse (!) 59   Temp 99.1 F (37.3 C) (Oral)   Resp 14   SpO2 100%  Gen:   Awake, no distress   Resp:  Normal effort  MSK:   Moves extremities without difficulty    Medical Decision Making  Medically screening exam initiated at 12:10 PM.  Appropriate orders placed.  David Scott was informed that the remainder of the evaluation will be completed by another provider, this initial triage assessment does not replace that evaluation, and the importance of remaining in the ED until their evaluation is complete.  Labs, cxr, ekg, Amie Portland, PA-C 11/16/20 1211    Cheryll Cockayne, MD 11/18/20 [phone]

## 2020-11-16 NOTE — ED Provider Notes (Signed)
[facility] EMERGENCY DEPARTMENT Provider Note   CSN: [id number] Arrival date & time: 11/16/20  1128     History No chief complaint on file.   David Scott is a 80 y.o. male.  Pt presents to the ED today with fatigue.  He is a difficult historian, but said he's been feeling generalized weak for 1-2 days.  He felt like he could not walk at home, but was able to walk to the bathroom here.  He has a mild cough and said his albuterol is not as effective as it has been.  He did have a tooth removed last week and is concerned that is why he is tired.  He has been drinking cactus juice to try to help him feel better.  He lives with 2 of his sons who are not here.      Past Medical History:  Diagnosis Date   CVA (cerebral vascular accident) [facility])    Esophageal reflux    Leg fracture     Patient Active Problem List   Diagnosis Date Noted   Supranuclear palsy (HCC) 05/10/2018   Stroke (HCC) 04/12/2017   Memory loss 02/03/2017   Gait disturbance 02/03/2017   Polyneuropathy 02/03/2017   Numbness 02/03/2017    History reviewed. No pertinent surgical history.     Family History  Problem Relation Age of Onset   COPD Father     Social History   Tobacco Use   Smoking status: Never   Smokeless tobacco: Never  Substance Use Topics   Alcohol use: No   Drug use: No    Home Medications Prior to Admission medications   Medication Sig Start Date End Date Taking? Authorizing Provider  acetaminophen (TYLENOL) 325 MG tablet Take 2 tablets (650 mg total) by mouth every 6 (six) hours as needed. 09/20/20   Renne Crigler, PA-C  albuterol (PROVENTIL HFA;VENTOLIN HFA) 108 (90 Base) MCG/ACT inhaler Inhale 2 puffs into the lungs every 6 (six) hours as needed for wheezing or shortness of breath.  09/09/17   [provider]  amoxicillin (AMOXIL) 500 MG capsule Take 1 capsule (500 mg total) by mouth 3 (three) times daily. 09/20/20   Renne Crigler, PA-C  aspirin 81  MG tablet Take 81 mg by mouth daily after lunch.     [provider]  Cholecalciferol (VITAMIN D-3 PO) Take 1 capsule by mouth daily after lunch.     [provider]  Magnesium 250 MG TABS Take 250 mg by mouth daily.    [provider]  Multiple Vitamin (MULTIVITAMIN) tablet Take 1 tablet by mouth 2 (two) times daily after a meal.     [provider]  vitamin C (ASCORBIC ACID) 500 MG tablet Take 1,000 mg by mouth daily.    [provider]    Allergies    Benzonatate, Tamsulosin hcl, and Septra [sulfamethoxazole-trimethoprim]  Review of Systems   Review of Systems  Neurological:  Positive for weakness.  All other systems reviewed and are negative.  Physical Exam Updated Vital Signs BP (!) 178/63   Pulse (!) 51   Temp 98 F (36.7 C)   Resp 19   SpO2 100%   Physical Exam Vitals and nursing note reviewed.  Constitutional:      Appearance: He is underweight.  HENT:     Head: Normocephalic and atraumatic.     Right Ear: External ear normal.     Left Ear: External ear normal.     Nose: Nose  normal.     Mouth/Throat:     Mouth: Mucous membranes are moist.     Pharynx: Oropharynx is clear.  Eyes:     Extraocular Movements: Extraocular movements intact.     Conjunctiva/sclera: Conjunctivae normal.     Pupils: Pupils are equal, round, and reactive to light.  Cardiovascular:     Rate and Rhythm: Normal rate and regular rhythm.     Pulses: Normal pulses.     Heart sounds: Normal heart sounds.  Pulmonary:     Effort: Pulmonary effort is normal.     Breath sounds: Normal breath sounds.  Abdominal:     General: Abdomen is flat. Bowel sounds are normal.     Palpations: Abdomen is soft.  Musculoskeletal:        General: Normal range of motion.     Cervical back: Normal range of motion and neck supple.  Skin:    General: Skin is warm.     Capillary Refill: Capillary refill takes less than 2 seconds.  Neurological:     General: No  focal deficit present.     Mental Status: He is alert and oriented to person, place, and time.  Psychiatric:        Mood and Affect: Mood normal.        Behavior: Behavior normal.        Thought Content: Thought content normal.   ED Results / Procedures / Treatments   Labs (all labs ordered are listed, but only abnormal results are displayed) Labs Reviewed  CBC WITH DIFFERENTIAL/PLATELET - Abnormal; Notable for the following components:      Result Value   WBC 3.9 (*)    All other components within normal limits  COMPREHENSIVE METABOLIC PANEL - Abnormal; Notable for the following components:   Glucose, Bld 109 (*)    BUN 27 (*)    Total Protein 6.4 (*)    All other components within normal limits  URINALYSIS, ROUTINE W REFLEX MICROSCOPIC - Abnormal; Notable for the following components:   APPearance HAZY (*)    Ketones, ur 5 (*)    All other components within normal limits  RESP PANEL BY RT-PCR (FLU A&B, COVID) ARPGX2  TROPONIN I (HIGH SENSITIVITY)  TROPONIN I (HIGH SENSITIVITY)    EKG EKG Interpretation  Date/Time:  Sunday November 16 2020 11:33:44 EDT Ventricular Rate:  62 PR Interval:  128 QRS Duration: 88 QT Interval:  398 QTC Calculation: 403 R Axis:   -38 Text Interpretation: Normal sinus rhythm Left axis deviation Left ventricular hypertrophy with repolarization abnormality ( Sokolow-Lyon , Cornell product ) Abnormal ECG No significant change since last tracing Confirmed by Jacalyn Lefevre [phone]) on 11/16/2020 3:44:39 PM  Radiology DG Chest 2 View  Result Date: 11/16/2020 CLINICAL DATA:  Weakness. Patient denies pain or shortness of breath. EXAM: CHEST - 2 VIEW COMPARISON:  November 03, 2020 FINDINGS: Hyperinflation of the lungs is stable. No pneumothorax. The cardiomediastinal silhouette is unremarkable. No pulmonary nodules or masses. No focal infiltrates. No acute abnormalities. IMPRESSION: No active cardiopulmonary disease. Electronically Signed   By: Gerome Sam  III M.D   On: 11/16/2020 12:17   CT Head Wo Contrast  Result Date: 11/16/2020 CLINICAL DATA:  Generalized weakness since yesterday. Mental status change. EXAM: CT HEAD WITHOUT CONTRAST TECHNIQUE: Contiguous axial images were obtained from the base of the skull through the vertex without intravenous contrast. COMPARISON:  09/20/2020 FINDINGS: Brain: No evidence of acute infarction, hemorrhage, hydrocephalus, extra-axial collection or mass lesion/mass effect.  Vascular: No hyperdense vessel or unexpected calcification. Skull: Normal. Negative for fracture or focal lesion. Sinuses/Orbits: Visualized globes and orbits are unremarkable. The visualized sinuses are clear. Other: None. IMPRESSION: 1. No acute intracranial abnormalities. No change from the prior study. Electronically Signed   By: Amie Portland M.D.   On: 11/16/2020 18:11    Procedures Procedures   Medications Ordered in ED Medications  sodium chloride 0.9 % bolus 1,000 mL (0 mLs Intravenous Stopped 11/16/20 1752)    ED Course  I have reviewed the triage vital signs and the nursing notes.  Pertinent labs & imaging results that were available during my care of the patient were reviewed by me and considered in my medical decision making (see chart for details).    MDM Rules/Calculators/A&P                           Pt is feeling much better after fluids.  He is able to ambulate.  Work up neg.  Pt is stable for d/c.  Return if worse. Final Clinical Impression(s) / ED Diagnoses Final diagnoses:  Dehydration    Rx / DC Orders ED Discharge Orders     None        Jacalyn Lefevre, MD 11/16/20 Rickey Primus

## 2020-12-18 DIAGNOSIS — J449 Chronic obstructive pulmonary disease, unspecified: Secondary | ICD-10-CM | POA: Diagnosis not present

## 2020-12-22 DIAGNOSIS — H2513 Age-related nuclear cataract, bilateral: Secondary | ICD-10-CM | POA: Diagnosis not present

## 2020-12-22 DIAGNOSIS — H25013 Cortical age-related cataract, bilateral: Secondary | ICD-10-CM | POA: Diagnosis not present

## 2020-12-22 DIAGNOSIS — H04123 Dry eye syndrome of bilateral lacrimal glands: Secondary | ICD-10-CM | POA: Diagnosis not present

## 2020-12-22 DIAGNOSIS — H40033 Anatomical narrow angle, bilateral: Secondary | ICD-10-CM | POA: Diagnosis not present

## 2020-12-27 ENCOUNTER — Encounter (HOSPITAL_COMMUNITY): Payer: Self-pay | Admitting: Emergency Medicine

## 2020-12-27 ENCOUNTER — Emergency Department (HOSPITAL_COMMUNITY): Payer: Medicare Other

## 2020-12-27 ENCOUNTER — Other Ambulatory Visit: Payer: Self-pay

## 2020-12-27 ENCOUNTER — Emergency Department (HOSPITAL_COMMUNITY)
Admission: EM | Admit: 2020-12-27 | Discharge: 2020-12-28 | Disposition: A | Discharge status: Home / Self Care | Payer: Medicare Other | Attending: Emergency Medicine | Admitting: Emergency Medicine

## 2020-12-27 DIAGNOSIS — K5900 Constipation, unspecified: Secondary | ICD-10-CM | POA: Diagnosis not present

## 2020-12-27 DIAGNOSIS — K4090 Unilateral inguinal hernia, without obstruction or gangrene, not specified as recurrent: Secondary | ICD-10-CM | POA: Diagnosis not present

## 2020-12-27 DIAGNOSIS — K6389 Other specified diseases of intestine: Secondary | ICD-10-CM | POA: Diagnosis not present

## 2020-12-27 DIAGNOSIS — N400 Enlarged prostate without lower urinary tract symptoms: Secondary | ICD-10-CM | POA: Diagnosis not present

## 2020-12-27 DIAGNOSIS — K5641 Fecal impaction: Secondary | ICD-10-CM

## 2020-12-27 DIAGNOSIS — R109 Unspecified abdominal pain: Secondary | ICD-10-CM | POA: Diagnosis present

## 2020-12-27 LAB — URINALYSIS, ROUTINE W REFLEX MICROSCOPIC
Bilirubin Urine: NEGATIVE
Glucose, UA: NEGATIVE mg/dL
Hgb urine dipstick: NEGATIVE
Ketones, ur: NEGATIVE mg/dL
Nitrite: NEGATIVE
Protein, ur: 30 mg/dL — AB
Specific Gravity, Urine: 1.018 (ref 1.005–1.030)
WBC, UA: >50 WBC/hpf — ABNORMAL HIGH (ref 0–5)
pH: 7 (ref 5.0–8.0)

## 2020-12-27 LAB — COMPREHENSIVE METABOLIC PANEL
ALT: 20 U/L (ref 0–44)
AST: 26 U/L (ref 15–41)
Albumin: 3.9 g/dL (ref 3.5–5.0)
Alkaline Phosphatase: 60 U/L (ref 38–126)
Anion gap: 8 (ref 5–15)
BUN: 26 mg/dL — ABNORMAL HIGH (ref 8–23)
CO2: 29 mmol/L (ref 22–32)
Calcium: 9.2 mg/dL (ref 8.9–10.3)
Chloride: 99 mmol/L (ref 98–111)
Creatinine, Ser: 1.12 mg/dL (ref 0.61–1.24)
GFR, Estimated: >60 mL/min (ref >60)
Glucose, Bld: 159 mg/dL — ABNORMAL HIGH (ref 70–99)
Potassium: 4.5 mmol/L (ref 3.5–5.1)
Sodium: 136 mmol/L (ref 135–145)
Total Bilirubin: 0.5 mg/dL (ref 0.3–1.2)
Total Protein: 6.4 g/dL — ABNORMAL LOW (ref 6.5–8.1)

## 2020-12-27 LAB — CBC WITH DIFFERENTIAL/PLATELET
Abs Immature Granulocytes: 0.03 10*3/uL (ref 0.00–0.07)
Basophils Absolute: 0 10*3/uL (ref 0.0–0.1)
Basophils Relative: 0 %
Eosinophils Absolute: 0.1 10*3/uL (ref 0.0–0.5)
Eosinophils Relative: 1 %
HCT: 43 % (ref 39.0–52.0)
Hemoglobin: 14.5 g/dL (ref 13.0–17.0)
Immature Granulocytes: 0 %
Lymphocytes Relative: 7 %
Lymphs Abs: 0.6 10*3/uL — ABNORMAL LOW (ref 0.7–4.0)
MCH: 33.9 pg (ref 26.0–34.0)
MCHC: 33.7 g/dL (ref 30.0–36.0)
MCV: 100.5 fL — ABNORMAL HIGH (ref 80.0–100.0)
Monocytes Absolute: 0.6 10*3/uL (ref 0.1–1.0)
Monocytes Relative: 7 %
Neutro Abs: 6.9 10*3/uL (ref 1.7–7.7)
Neutrophils Relative %: 85 %
Platelets: 201 10*3/uL (ref 150–400)
RBC: 4.28 MIL/uL (ref 4.22–5.81)
RDW: 13 % (ref 11.5–15.5)
WBC: 8.2 10*3/uL (ref 4.0–10.5)
nRBC: 0 % (ref 0.0–0.2)

## 2020-12-27 LAB — LIPASE, BLOOD: Lipase: 23 U/L (ref 11–51)

## 2020-12-27 MED ORDER — IOHEXOL 350 MG/ML SOLN
80.0000 mL | Freq: Once | INTRAVENOUS | Status: AC | PRN
  Administered 2020-12-27: 80 mL via INTRAVENOUS

## 2020-12-27 NOTE — ED Provider Notes (Signed)
Emergency Medicine Provider Triage Evaluation Note  ERMAL David Scott , a 80 y.o. male  was evaluated in triage.  Pt complains of constipation.  Review of Systems  Positive: constipation Negative: Rectal pain  Physical Exam  BP (!) 144/81 (BP Location: Left Arm)   Pulse 67   Temp 98.8 F (37.1 C) (Oral)   Resp 16   SpO2 97%  Gen:   Awake, no distress   Resp:  Normal effort  MSK:   Moves extremities without difficulty  Other:    Medical Decision Making  Medically screening exam initiated at 6:03 PM.  Appropriate orders placed.  [name] was informed that the remainder of the evaluation will be completed by another provider, this initial triage assessment does not replace that evaluation, and the importance of remaining in the ED until their evaluation is complete.  Pt report feeling constipation for a week. Poor historian. Sts he has an abdominal hernia.     Fayrene Helper, PA-C 12/27/20 1804    Derwood Kaplan, MD 12/28/20 1137

## 2020-12-27 NOTE — ED Triage Notes (Signed)
Pt reports constipation. Pt states last BM was 4-5 days ago. Denies pain.

## 2020-12-28 NOTE — ED Notes (Signed)
Pt given shower and changed. pt had bowel movement while showering

## 2020-12-28 NOTE — Discharge Instructions (Addendum)
You were evaluated in the Emergency Department and after careful evaluation, we did not find any emergent condition requiring admission or further testing in the hospital.  Your exam/testing today is overall reassuring.  Symptoms seem to be due to a fecal impaction.  We disimpacted you here in the emergency department.  It is important that you take stool softeners daily to prevent this from happening again.  We recommend MiraLAX up to 6 times daily until you achieve soft stools.  This can then be decreased to once or twice daily.  Please return to the Emergency Department if you experience any worsening of your condition.   Thank you for allowing [place] to be a part of your care.

## 2020-12-28 NOTE — ED Provider Notes (Signed)
[facility]EM[facility]Y DEPT [facility] Emergency Department Provider Note MRN:  [id number]  Arrival date & time: 12/28/20     Chief Complaint   Abdominal pain History of Present Illness   David Scott is a 80 y.o. year-old male with a history of CVA presenting to the ED with chief complaint of abdominal pain.  Location: Diffuse abdomen Duration: A few days Onset: Gradual Timing: Constant Description: Bloated Severity: Mild to moderate Exacerbating/Alleviating Factors: None Associated Symptoms: No bowel movement for 5 days Pertinent Negatives: Denies nausea vomiting, no fever  Additional History: None  Review of Systems  A complete 10 system review of systems was obtained and all systems are negative except as noted in the HPI and PMH.   Patient's Health History    Past Medical History:  Diagnosis Date   CVA (cerebral vascular accident) (HCC)    Esophageal reflux    Leg fracture     History reviewed. No pertinent surgical history.  Family History  Problem Relation Age of Onset   COPD Father     Social History   Socioeconomic History   Marital status: Single    Spouse name: Not on file   Number of children: Not on file   Years of education: Not on file   Highest education level: Not on file  Occupational History   Not on file  Tobacco Use   Smoking status: Never   Smokeless tobacco: Never  Substance and Sexual Activity   Alcohol use: No   Drug use: No   Sexual activity: Not on file  Other Topics Concern   Not on file  Social History Narrative   Not on file   Social Determinants of Health   Financial Resource Strain: Not on file  Food Insecurity: Not on file  Transportation Needs: Not on file  Physical Activity: Not on file  Stress: Not on file  Social Connections: Not on file  Intimate Partner Violence: Not on file     Physical Exam   Vitals:   12/27/20 2031 12/28/20 0015  BP: 138/79 (!) 171/78  Pulse: 76 72  Resp: 17   Temp:    SpO2:  98% 98%    CONSTITUTIONAL: Well-appearing, NAD NEURO: Awake, alert, oriented to name, moves all extremities EYES:  eyes equal and reactive ENT/NECK:  no LAD, no JVD CARDIO: Regular rate, well-perfused, normal S1 and S2 PULM:  CTAB no wheezing or rhonchi GI/GU:  normal bowel sounds, non-distended, non-tender MSK/SPINE:  No gross deformities, no edema SKIN:  no rash, atraumatic PSYCH:  Appropriate speech and behavior  *Additional and/or pertinent findings included in MDM below  Diagnostic and Interventional Summary    EKG Interpretation  Date/Time:    Ventricular Rate:    PR Interval:    QRS Duration:   QT Interval:    QTC Calculation:   R Axis:     Text Interpretation:         Labs Reviewed  CBC WITH DIFFERENTIAL/PLATELET - Abnormal; Notable for the following components:      Result Value   MCV 100.5 (*)    Lymphs Abs 0.6 (*)    All other components within normal limits  COMPREHENSIVE METABOLIC PANEL - Abnormal; Notable for the following components:   Glucose, Bld 159 (*)    BUN 26 (*)    Total Protein 6.4 (*)    All other components within normal limits  URINALYSIS, ROUTINE W REFLEX MICROSCOPIC - Abnormal; Notable for the following components:   Color, Urine  AMBER (*)    APPearance CLOUDY (*)    Protein, ur 30 (*)    Leukocytes,Ua LARGE (*)    WBC, UA >50 (*)    Bacteria, UA FEW (*)    Non Squamous Epithelial 0-5 (*)    All other components within normal limits  URINE CULTURE  LIPASE, BLOOD    CT ABDOMEN PELVIS W CONTRAST  Final Result      Medications  iohexol (OMNIPAQUE) 350 MG/ML injection 80 mL (80 mLs Intravenous Contrast Given 12/27/20 2109)     Procedures  /  Critical Care Fecal disimpaction  Date/Time: 12/28/2020 12:49 AM Performed by: Sabas Sous, MD Authorized by: Sabas Sous, MD  Consent: Verbal consent obtained. Risks and benefits: risks, benefits and alternatives were discussed Consent given by: patient Patient understanding:  patient states understanding of the procedure being performed Patient consent: the patient's understanding of the procedure matches consent given Patient identity confirmed: verbally with patient Time out: Immediately prior to procedure a "time out" was called to verify the correct patient, procedure, equipment, support staff and site/side marked as required.  Sedation: Patient sedated: no  Patient tolerance: patient tolerated the procedure well with no immediate complications Comments: Large amount of solid stool removed from rectal vault    ED Course and Medical Decision Making  I have reviewed the triage vital signs, the nursing notes, and pertinent available records from the EMR.  Listed above are laboratory and imaging tests that I personally ordered, reviewed, and interpreted and then considered in my medical decision making (see below for details).  Constipation, suspect fecal impaction given that patient reports a large bowel movement that he cannot pass on his own.  This is supported by CT findings obtained in triage.  Abdomen overall is benign, normal vital signs, will attempt disimpaction.     Disimpaction is successful, patient is appropriate for discharge with stool softeners.  Elmer Sow. Pilar Plate, MD [facility] Emergency Medicine [facility] [email or web address]  Final Clinical Impressions(s) / ED Diagnoses     ICD-10-CM   1. Fecal impaction in rectum [facility])  K56.41       ED Discharge Orders     None        Discharge Instructions Discussed with and Provided to Patient:     Discharge Instructions      You were evaluated in the Emergency Department and after careful evaluation, we did not find any emergent condition requiring admission or further testing in the hospital.  Your exam/testing today is overall reassuring.  Symptoms seem to be due to a fecal impaction.  We disimpacted you here in the emergency department.  It is important that you  take stool softeners daily to prevent this from happening again.  We recommend MiraLAX up to 6 times daily until you achieve soft stools.  This can then be decreased to once or twice daily.  Please return to the Emergency Department if you experience any worsening of your condition.   Thank you for allowing [place] to be a part of your care.        Sabas Sous, MD 12/28/20 [phone]

## 2021-01-03 ENCOUNTER — Emergency Department (HOSPITAL_COMMUNITY): Payer: Medicare Other

## 2021-01-03 ENCOUNTER — Emergency Department (HOSPITAL_COMMUNITY)
Admission: EM | Admit: 2021-01-03 | Discharge: 2021-01-03 | Disposition: A | Discharge status: Home / Self Care | Payer: Medicare Other | Attending: Emergency Medicine | Admitting: Emergency Medicine

## 2021-01-03 ENCOUNTER — Other Ambulatory Visit: Payer: Self-pay

## 2021-01-03 ENCOUNTER — Encounter (HOSPITAL_COMMUNITY): Payer: Self-pay | Admitting: *Deleted

## 2021-01-03 DIAGNOSIS — R0602 Shortness of breath: Secondary | ICD-10-CM | POA: Diagnosis not present

## 2021-01-03 DIAGNOSIS — Z20822 Contact with and (suspected) exposure to covid-19: Secondary | ICD-10-CM | POA: Diagnosis not present

## 2021-01-03 DIAGNOSIS — B379 Candidiasis, unspecified: Secondary | ICD-10-CM | POA: Diagnosis not present

## 2021-01-03 DIAGNOSIS — I517 Cardiomegaly: Secondary | ICD-10-CM | POA: Diagnosis not present

## 2021-01-03 DIAGNOSIS — B372 Candidiasis of skin and nail: Secondary | ICD-10-CM | POA: Diagnosis not present

## 2021-01-03 DIAGNOSIS — R059 Cough, unspecified: Secondary | ICD-10-CM | POA: Insufficient documentation

## 2021-01-03 LAB — COMPREHENSIVE METABOLIC PANEL
ALT: 23 U/L (ref 0–44)
AST: 24 U/L (ref 15–41)
Albumin: 3.9 g/dL (ref 3.5–5.0)
Alkaline Phosphatase: 52 U/L (ref 38–126)
Anion gap: 8 (ref 5–15)
BUN: 29 mg/dL — ABNORMAL HIGH (ref 8–23)
CO2: 27 mmol/L (ref 22–32)
Calcium: 9.1 mg/dL (ref 8.9–10.3)
Chloride: 102 mmol/L (ref 98–111)
Creatinine, Ser: 0.93 mg/dL (ref 0.61–1.24)
GFR, Estimated: >60 mL/min (ref >60)
Glucose, Bld: 88 mg/dL (ref 70–99)
Potassium: 4.7 mmol/L (ref 3.5–5.1)
Sodium: 137 mmol/L (ref 135–145)
Total Bilirubin: 0.7 mg/dL (ref 0.3–1.2)
Total Protein: 6.5 g/dL (ref 6.5–8.1)

## 2021-01-03 LAB — RESP PANEL BY RT-PCR (FLU A&B, COVID) ARPGX2
Influenza A by PCR: NEGATIVE
Influenza B by PCR: NEGATIVE
SARS Coronavirus 2 by RT PCR: NEGATIVE

## 2021-01-03 LAB — CBC WITH DIFFERENTIAL/PLATELET
Abs Immature Granulocytes: 0.02 10*3/uL (ref 0.00–0.07)
Basophils Absolute: 0 10*3/uL (ref 0.0–0.1)
Basophils Relative: 1 %
Eosinophils Absolute: 0.2 10*3/uL (ref 0.0–0.5)
Eosinophils Relative: 3 %
HCT: 39.4 % (ref 39.0–52.0)
Hemoglobin: 13 g/dL (ref 13.0–17.0)
Immature Granulocytes: 0 %
Lymphocytes Relative: 21 %
Lymphs Abs: 1.2 10*3/uL (ref 0.7–4.0)
MCH: 33.2 pg (ref 26.0–34.0)
MCHC: 33 g/dL (ref 30.0–36.0)
MCV: 100.8 fL — ABNORMAL HIGH (ref 80.0–100.0)
Monocytes Absolute: 0.6 10*3/uL (ref 0.1–1.0)
Monocytes Relative: 11 %
Neutro Abs: 3.7 10*3/uL (ref 1.7–7.7)
Neutrophils Relative %: 64 %
Platelets: 211 10*3/uL (ref 150–400)
RBC: 3.91 MIL/uL — ABNORMAL LOW (ref 4.22–5.81)
RDW: 13.2 % (ref 11.5–15.5)
WBC: 5.8 10*3/uL (ref 4.0–10.5)
nRBC: 0 % (ref 0.0–0.2)

## 2021-01-03 LAB — LIPASE, BLOOD: Lipase: 84 U/L — ABNORMAL HIGH (ref 11–51)

## 2021-01-03 LAB — BRAIN NATRIURETIC PEPTIDE: B Natriuretic Peptide: 116.6 pg/mL — ABNORMAL HIGH (ref 0.0–100.0)

## 2021-01-03 LAB — TROPONIN I (HIGH SENSITIVITY): Troponin I (High Sensitivity): 9 ng/L (ref <18)

## 2021-01-03 MED ORDER — AMLODIPINE BESYLATE 5 MG PO TABS
5.0000 mg | ORAL_TABLET | Freq: Once | ORAL | Status: AC
  Administered 2021-01-03: 5 mg via ORAL
  Filled 2021-01-03: qty 1

## 2021-01-03 MED ORDER — CLOTRIMAZOLE 1 % EX CREA: TOPICAL_CREAM | CUTANEOUS | 0 refills | Status: DC

## 2021-01-03 MED ORDER — FUROSEMIDE 20 MG PO TABS: 20.0000 mg | ORAL_TABLET | Freq: Every day | ORAL | 0 refills | Status: DC

## 2021-01-03 MED ORDER — AMLODIPINE BESYLATE 5 MG PO TABS: 5.0000 mg | ORAL_TABLET | Freq: Every day | ORAL | 0 refills | Status: DC

## 2021-01-03 NOTE — ED Provider Notes (Signed)
Emergency Department Provider Note   I have reviewed the triage vital signs and the nursing notes.   HISTORY  Chief Complaint Shortness of Breath and Rash   HPI David Scott is a 80 y.o. male with past medical history reviewed below including prior stroke and memory loss presents to the emergency department with shortness of breath symptoms.  He also notes an associated rash in the groin area.  Patient endorses that his shortness of breath began yesterday but has some difficulty providing exact history due to his underlying memory issues.  He feels that there is something in his upper/mid chest which feels like congestion in his restricting his breathing ability.  No difficulty eating or drinking.  No chest pain/pressure. No fever. No vomiting or choking episodes.   Past Medical History:  Diagnosis Date   CVA (cerebral vascular accident) [facility])    Esophageal reflux    Leg fracture     Patient Active Problem List   Diagnosis Date Noted   Supranuclear palsy (HCC) 05/10/2018   Stroke (HCC) 04/12/2017   Memory loss 02/03/2017   Gait disturbance 02/03/2017   Polyneuropathy 02/03/2017   Numbness 02/03/2017    History reviewed. No pertinent surgical history.  Allergies Benzonatate, Tamsulosin hcl, and Septra [sulfamethoxazole-trimethoprim]  Family History  Problem Relation Age of Onset   COPD Father     Social History Social History   Tobacco Use   Smoking status: Never   Smokeless tobacco: Never  Substance Use Topics   Alcohol use: No   Drug use: No    Review of Systems  Constitutional: No fever/chills Eyes: No visual changes. ENT: No sore throat. Cardiovascular: Denies chest pain. Respiratory: Positive shortness of breath. Gastrointestinal: No abdominal pain.  No nausea, no vomiting.  No diarrhea.  No constipation. Genitourinary: Negative for dysuria. Musculoskeletal: Negative for back pain. Skin: Positive groin rash.   10-point ROS otherwise  negative.  ____________________________________________   PHYSICAL EXAM:  VITAL SIGNS: ED Triage Vitals  Enc Vitals Group     BP 01/03/21 0421 (!) 168/75     Pulse Rate 01/03/21 0421 (!) 55     Resp 01/03/21 0421 14     Temp 01/03/21 0421 98.5 F (36.9 C)     Temp Source 01/03/21 0421 Oral     SpO2 01/03/21 0421 99 %   Constitutional: Alert and oriented. Well appearing and in no acute distress. Eyes: Conjunctivae are normal.  Head: Atraumatic. Nose: No congestion/rhinnorhea. Mouth/Throat: Mucous membranes are moist.   Neck: No stridor. Cardiovascular: Sinus bradycardia. Good peripheral circulation. Grossly normal heart sounds.   Respiratory: Normal respiratory effort.  No retractions. Lungs CTAB. Gastrointestinal: Soft and nontender. Soft right inguinal hernia. No tenderness. No distention.  Musculoskeletal: No lower extremity tenderness with 1+ pitting edema bilaterally. No gross deformities of extremities. Neurologic:  Normal speech and language. No gross focal neurologic deficits are appreciated.  Skin: Erythema to the bilateral inguinal creases with white discharge. No abscess.    ____________________________________________   LABS (all labs ordered are listed, but only abnormal results are displayed)  Labs Reviewed  CBC WITH DIFFERENTIAL/PLATELET - Abnormal; Notable for the following components:      Result Value   RBC 3.91 (*)    MCV 100.8 (*)    All other components within normal limits  COMPREHENSIVE METABOLIC PANEL - Abnormal; Notable for the following components:   BUN 29 (*)    All other components within normal limits  LIPASE, BLOOD - Abnormal; Notable for  the following components:   Lipase 84 (*)    All other components within normal limits  BRAIN NATRIURETIC PEPTIDE - Abnormal; Notable for the following components:   B Natriuretic Peptide 116.6 (*)    All other components within normal limits  RESP PANEL BY RT-PCR (FLU A&B, COVID) ARPGX2  TROPONIN I  (HIGH SENSITIVITY)   ____________________________________________  EKG   EKG Interpretation  Date/Time:  Saturday January 03 2021 04:29:04 EDT Ventricular Rate:  62 PR Interval:    QRS Duration: 76 QT Interval:  404 QTC Calculation: 410 R Axis:   -17 Text Interpretation: Normal sinus rhythm Similar to prior. Artifact noted. Confirmed by Alona Bene [phone]) on 01/03/2021 1:24:46 PM        ____________________________________________  RADIOLOGY  DG Chest 2 View  Result Date: 01/03/2021 CLINICAL DATA:  Shortness of breath EXAM: CHEST - 2 VIEW COMPARISON:  11/16/2020. FINDINGS: Chronic anterior chest wall deformity. Hyperinflation. The Chin overlies the apices on the frontal radiograph. Patient rotated left. Mild to moderate cardiomegaly. No pleural effusion or pneumothorax. No congestive failure. No lobar consolidation. IMPRESSION: Cardiomegaly, without congestive failure. Mild position related limitations as detailed above. Electronically Signed   By: Jeronimo Greaves M.D.   On: 01/03/2021 05:59    ____________________________________________   PROCEDURES  Procedure(s) performed:   Procedures  None  ____________________________________________   INITIAL IMPRESSION / ASSESSMENT AND PLAN / ED COURSE  Pertinent labs & imaging results that were available during my care of the patient were reviewed by me and considered in my medical decision making (see chart for details).   Patient presents emergency department with shortness of breath along with rash in the inguinal area of the inguinal rash seems most consistent with a candidal type infection.  Will prescribe topical medications for this and encourage patient to keep this area clean and dry as best able to.   's cough symptoms are nonspecific.  He has no increased respiratory effort on exam.  No wheezing, rales, rhonchi.  His oxygen saturations are 100%.  No acute change in his EKG compared to prior.  X-ray shows  cardiomegaly but no other findings to suspect congestive heart failure.  He does have some pitting edema in the legs.  Plan for 2 days of Lasix and will have him follow closely with his primary care doctor.  I do also plan to start blood pressure medication here which the patient is not taking.  May benefit from outpatient echo if symptoms continue. No findings on CXR to suggest PNA. COVID negative.    ____________________________________________  FINAL CLINICAL IMPRESSION(S) / ED DIAGNOSES  Final diagnoses:  Shortness of breath  Cough  Candida infection     MEDICATIONS GIVEN DURING THIS VISIT:  Medications  amLODipine (NORVASC) tablet 5 mg (5 mg Oral Given 01/03/21 1200)     NEW OUTPATIENT MEDICATIONS STARTED DURING THIS VISIT:  New Prescriptions   AMLODIPINE (NORVASC) 5 MG TABLET    Take 1 tablet (5 mg total) by mouth daily.   CLOTRIMAZOLE (LOTRIMIN) 1 % CREAM    Apply to affected area 2 times daily for 7 days.   FUROSEMIDE (LASIX) 20 MG TABLET    Take 1 tablet (20 mg total) by mouth daily for 3 days.    Note:  This document was prepared using Dragon voice recognition software and may include unintentional dictation errors.  Alona Bene, MD, [facility] Emergency Medicine    Daeshon Grammatico, Arlyss Repress, MD 01/03/21 1414

## 2021-01-03 NOTE — ED Triage Notes (Signed)
Pt c/o sob since yesterday. Also reports redness to bilateral groin that he applied cream to. Redness noted to bilateral groin area

## 2021-01-03 NOTE — Discharge Instructions (Signed)
You were seen in the emergency room today with cough and shortness of breath/congestion symptoms.  Your COVID test was negative and x-ray did not show any pneumonia.  Your lab work is overall reassuring.  I do think you may be holding onto a bit of fluid and I will put you on 3 days of Lasix to take daily.  This will make you urinate more.  I am also starting you on a blood pressure medication.  Please follow closely with your primary care doctor in the coming week to repeat your blood pressure and see if these medications are improving symptoms and schedule additional outpatient treatments as needed.   Your rash in the groin area seems to be from a yeast.  Please use the medication as prescribed.  Please try and keep this area clean and dry as best you are able.   Return to the emergency department any new or suddenly worsening symptoms such as chest pain, worsening trouble breathing, fever, confusion, etc.

## 2021-01-03 NOTE — ED Provider Notes (Addendum)
Emergency Medicine Provider Triage Evaluation Note  David Scott , a 80 y.o. male  was evaluated in triage.  Patient reports he presents for 2 problems.  Shortness of breath which started sometime this afternoon.  Patient reports he is not exactly sure when.  He is unable to elaborate on this.  Additionally complains of rash in his groin which has been present for an unknown amount of time.  Reports he put Lamisil on it which he thinks is making it better.  Unable to give any additional details on this either.  Level 5 caveat for dementia.  Pt's son at bedside reports he has been short of breath for the last several hours. Pt sometimes has difficulty breathing but tonight it seemed to persist. Hx of asthma and COPD - uses an inhaler, but it did not help tonight.    Review of Systems  Positive: Shortness of breath, rash Negative: Fever, chills, pain, chest pain  Physical Exam  BP (!) 168/75 (BP Location: Left Arm)   Pulse (!) 55   Temp 98.5 F (36.9 C) (Oral)   Resp 14   SpO2 99%  Gen:   Awake, no distress   Resp:  Normal effort  MSK:   Moves extremities without difficulty, no peripheral edema Other:  Red beefy rash to the bilateral groin consistent with Candida  Medical Decision Making  Medically screening exam initiated at 4:27 AM.  Appropriate orders placed.  [name] was informed that the remainder of the evaluation will be completed by another provider, this initial triage assessment does not replace that evaluation, and the importance of remaining in the ED until their evaluation is complete.  Shortness of breath.  Work-up pending.  Candidal rash to the groin.   [name], [name] 01/03/21 0441    Pilot Prindle, Dahlia Client, PA-C 01/03/21 0451    Gilda Crease, MD 01/03/21 [phone]

## 2021-01-07 DIAGNOSIS — R600 Localized edema: Secondary | ICD-10-CM | POA: Diagnosis not present

## 2021-01-07 DIAGNOSIS — R0602 Shortness of breath: Secondary | ICD-10-CM | POA: Diagnosis not present

## 2021-02-03 DIAGNOSIS — H25813 Combined forms of age-related cataract, bilateral: Secondary | ICD-10-CM | POA: Diagnosis not present

## 2021-03-03 ENCOUNTER — Ambulatory Visit (INDEPENDENT_AMBULATORY_CARE_PROVIDER_SITE_OTHER): Payer: Medicare Other | Admitting: Cardiovascular Disease

## 2021-03-03 ENCOUNTER — Encounter (HOSPITAL_BASED_OUTPATIENT_CLINIC_OR_DEPARTMENT_OTHER): Payer: Self-pay | Admitting: Cardiovascular Disease

## 2021-03-03 ENCOUNTER — Other Ambulatory Visit: Payer: Self-pay

## 2021-03-03 DIAGNOSIS — R0602 Shortness of breath: Secondary | ICD-10-CM | POA: Diagnosis not present

## 2021-03-03 DIAGNOSIS — L989 Disorder of the skin and subcutaneous tissue, unspecified: Secondary | ICD-10-CM | POA: Diagnosis not present

## 2021-03-03 DIAGNOSIS — I639 Cerebral infarction, unspecified: Secondary | ICD-10-CM

## 2021-03-03 HISTORY — DX: Disorder of the skin and subcutaneous tissue, unspecified: L98.9

## 2021-03-03 HISTORY — DX: Shortness of breath: R06.02

## 2021-03-03 MED ORDER — FUROSEMIDE 20 MG PO TABS: ORAL_TABLET | ORAL | 3 refills | Status: DC

## 2021-03-03 NOTE — Assessment & Plan Note (Signed)
This of breathMr. Scott has rest as well as lower extremity edema.  We will get an echocardiogram to assess for heart failure.  BNP was mildly elevated to 116 when he was at the hospital.  Recommended increasing his furosemide to 20 mg every day.  However son notes that he got lightheaded when he was on higher doses of Lasix.  Therefore we will wait on the echo results before making any changes.  We will get a copy of labs from his PCP.

## 2021-03-03 NOTE — Patient Instructions (Addendum)
Medication Instructions:  OK TO TAE AN EXTRA 1/2 FUROSEMIDE AS NEEDED FOR SHORTNESS OF BREATH OR SWELLING   *If you need a refill on your cardiac medications before your next appointment, please call your pharmacy*  Lab Work: REQUESTED FROM PRIMARY CARE   If you have labs (blood work) drawn today and your tests are completely normal, you will receive your results only by: MyChart Message (if you have MyChart) OR A paper copy in the mail If you have any lab test that is abnormal or we need to change your treatment, we will call you to review the results.  Testing/Procedures: Your physician has requested that you have an echocardiogram. Echocardiography is a painless test that uses sound waves to create images of your heart. It provides your doctor with information about the size and shape of your heart and how well your heart's chambers and valves are working. This procedure takes approximately one hour. There are no restrictions for this procedure.   Follow-Up: At [facility], you and your health needs are our priority.  As part of our continuing mission to provide you with exceptional heart care, we have created designated Provider Care Teams.  These Care Teams include your primary Cardiologist (physician) and Advanced Practice Providers (APPs -  Physician Assistants and Nurse Practitioners) who all work together to provide you with the care you need, when you need it.  We recommend signing up for the patient portal called "MyChart".  Sign up information is provided on this After Visit Summary.  MyChart is used to connect with patients for Virtual Visits (Telemedicine).  Patients are able to view lab/test results, encounter notes, upcoming appointments, etc.  Non-urgent messages can be sent to your provider as well.   To learn more about what you can do with MyChart, go to [email or web address].    Your next appointment:   3 month(s)  The format for your next appointment:   In  Person  Provider:   Chilton Si, MD   You have been referred to DERMATOLOGY

## 2021-03-03 NOTE — Assessment & Plan Note (Addendum)
Continue aspirin.  I do not see any fasting lipids and a CMP.  His son thinks that he had been with his PCP.  We will request records.  His LDL goal should be less than 70 and he is not on a statin at this time.

## 2021-03-03 NOTE — Progress Notes (Signed)
Cardiology Office Note:    Date:  04/26/2021   ID:  David Scott, DOB Feb 20, 1941, MRN [id number]  PCP:  David Else, MD   [facility] Providers Cardiologist:  David Si, MD     Referring MD: David Graham, PA-C   No chief complaint on file.   History of Present Illness:    David Scott is a 80 y.o. male with a hx of asthma, COPD, hypertension, aortic atherosclerosis, and GERD, here for the evaluation shortness of breath and edema. He saw his PCP on 11/2020 and reported feeling more short of breath. He had no edema on exam. His combivent inhaler was refilled and he was referred to cardiology. At a prior visit 10/2020 he had symptomatic bradycardia and left-sided chest pain. He had a heart rate of 53 on EKG. They recommended he go to the ED but he refused.  Today, he is accompanied with his son. He says that daily he has breathing problems that have been around for years. He feels short of breath when he is moving around, and sometimes at night when laying down. He states he has "pinching" pains in the center of his chest. His pain can happen when he is sitting down, and is typically very short.  At home, he does not check his blood pressure. In the ER his blood pressure was high, but he has readings closer to normal during visits with his PCP. He says when he's agitated mentally his blood pressure increases. He tries to exercise a little bit on a stationary bike everyday but sometimes he is unable to. He reports feeling good when he is on the bike.  He denies any palpitations. No headaches, syncope, orthopnea, or PND.   Past Medical History:  Diagnosis Date   CVA (cerebral vascular accident) [facility])    Esophageal reflux    Leg fracture    Shortness of breath 03/03/2021   Skin lesion 03/03/2021    No past surgical history on file.  Current Medications: Current Meds  Medication Sig   acetaminophen (TYLENOL) 325 MG tablet Take 2 tablets (650 mg total) by mouth every 6  (six) hours as needed.   albuterol (PROVENTIL HFA;VENTOLIN HFA) 108 (90 Base) MCG/ACT inhaler Inhale 2 puffs into the lungs every 6 (six) hours as needed for wheezing or shortness of breath.    aspirin 81 MG tablet Take 81 mg by mouth daily after lunch.    Cholecalciferol (VITAMIN D-3 PO) Take 1 capsule by mouth daily after lunch.    clotrimazole (LOTRIMIN) 1 % cream Apply to affected area 2 times daily for 7 days.   Magnesium 250 MG TABS Take 250 mg by mouth daily.   Multiple Vitamin (MULTIVITAMIN) tablet Take 1 tablet by mouth 2 (two) times daily after a meal.    vitamin C (ASCORBIC ACID) 500 MG tablet Take 1,000 mg by mouth daily.   [DISCONTINUED] furosemide (LASIX) 20 MG tablet Take by mouth as directed. Take 1/2 tablet daily     Allergies:   Benzonatate, Tamsulosin hcl, and Septra [sulfamethoxazole-trimethoprim]   Social History   Socioeconomic History   Marital status: Single    Spouse name: Not on file   Number of children: Not on file   Years of education: Not on file   Highest education level: Not on file  Occupational History   Not on file  Tobacco Use   Smoking status: Never   Smokeless tobacco: Never  Substance and Sexual Activity   Alcohol use: No  Drug use: No   Sexual activity: Not on file  Other Topics Concern   Not on file  Social History Narrative   Not on file   Social Determinants of Health   Financial Resource Strain: Not on file  Food Insecurity: Not on file  Transportation Needs: Not on file  Physical Activity: Not on file  Stress: Not on file  Social Connections: Not on file     Family History: The patient's family history includes COPD in his father.  ROS:   Please see the history of present illness.    (+) SOB All other systems reviewed and are negative.  EKGs/Labs/Other Studies Reviewed:    The following studies were reviewed today:  EKG:    03/03/2021: EKG was not ordered.  Recent Labs: 04/05/2021: ALT 23; B Natriuretic Peptide  164.5; BUN 42; Creatinine, Ser 1.16; Hemoglobin 13.4; Platelets 200; Potassium 4.7; Sodium 136   Recent Lipid Panel No results found for: CHOL, TRIG, HDL, CHOLHDL, VLDL, LDLCALC, LDLDIRECT       Physical Exam:    Wt Readings from Last 3 Encounters:  03/03/21 122 lb 14.4 oz (55.7 kg)  11/08/18 132 lb (59.9 kg)  05/10/18 129 lb (58.5 kg)     VS:  BP (!) 102/50 (BP Location: Right Arm, Patient Position: Sitting, Cuff Size: Normal)   Pulse (!) 54   Ht 5\' 10"  (1.778 m)   Wt 122 lb 14.4 oz (55.7 kg)   SpO2 97%   BMI 17.63 kg/m  , BMI Body mass index is 17.63 kg/m. GENERAL:  Well appearing HEENT: Pupils equal round and reactive, fundi not visualized, oral mucosa unremarkable NECK:  No jugular venous distention, waveform within normal limits, carotid upstroke brisk and symmetric, no bruits, no thyromegaly LUNGS:  Clear to auscultation bilaterally HEART:  RRR.  PMI not displaced or sustained,S1 and S2 within normal limits, no S3, no S4, no clicks, no rubs,  murmurs ABD:  Flat, positive bowel sounds normal in frequency in pitch, no bruits, no rebound, no guarding, no midline pulsatile mass, no hepatomegaly, no splenomegaly EXT:  2 plus pulses throughout, no edema, no cyanosis no clubbing SKIN:  No rashes, no nodules, Lesion on left LE NEURO:  Cranial nerves II through XII grossly intact, motor grossly intact throughout PSYCH:  Cognitively intact, oriented to person place and time   ASSESSMENT:    1. Cerebrovascular accident (CVA), unspecified mechanism (HCC)   2. Skin lesion   3. Shortness of breath    PLAN:    Stroke (HCC) Continue aspirin.  I do not see any fasting lipids and a CMP.  His son thinks that he had been with his PCP.  We will request records.  His LDL goal should be less than 70 and he is not on a statin at this time.  Skin lesion Mr. has 3 dark, irregularly shaped and hyperpigmented lesions on his left lower extremity.  I am concerned that these are  melanomas.  He thinks that they are growing and they are not going away.  They have been there for months.  He denies any preceding injury.  We will refer him to dermatology to consider biopsy.  Shortness of breath This of breathMr. Deyton has rest as well as lower extremity edema.  We will get an echocardiogram to assess for heart failure.  BNP was mildly elevated to 116 when he was at the hospital.  Recommended increasing his furosemide to 20 mg every day.  However son notes  that he got lightheaded when he was on higher doses of Lasix.  Therefore we will wait on the echo results before making any changes.  We will get a copy of labs from his PCP.        Disposition: FU with Keygan Dumond C. Duke Salvia, MD, [facility] in 2 months.  Medication Adjustments/Labs and Tests Ordered: Current medicines are reviewed at length with the patient today.  Concerns regarding medicines are outlined above.   Orders Placed This Encounter  Procedures   Ambulatory referral to Dermatology   ECHOCARDIOGRAM COMPLETE    Meds ordered this encounter  Medications   furosemide (LASIX) 20 MG tablet    Sig: Take 1/2 tablet daily OK to take an extra 1/2 tablet as needed for swelling or shortness of breath    Dispense:  30 tablet    Refill:  3    Patient Instructions  Medication Instructions:  OK TO TAE AN EXTRA 1/2 FUROSEMIDE AS NEEDED FOR SHORTNESS OF BREATH OR SWELLING   *If you need a refill on your cardiac medications before your next appointment, please call your pharmacy*  Lab Work: REQUESTED FROM PRIMARY CARE   If you have labs (blood work) drawn today and your tests are completely normal, you will receive your results only by: MyChart Message (if you have MyChart) OR A paper copy in the mail If you have any lab test that is abnormal or we need to change your treatment, we will call you to review the results.  Testing/Procedures: Your physician has requested that you have an echocardiogram. Echocardiography  is a painless test that uses sound waves to create images of your heart. It provides your doctor with information about the size and shape of your heart and how well your heart's chambers and valves are working. This procedure takes approximately one hour. There are no restrictions for this procedure.   Follow-Up: At [facility], you and your health needs are our priority.  As part of our continuing mission to provide you with exceptional heart care, we have created designated Provider Care Teams.  These Care Teams include your primary Cardiologist (physician) and Advanced Practice Providers (APPs -  Physician Assistants and Nurse Practitioners) who all work together to provide you with the care you need, when you need it.  We recommend signing up for the patient portal called "MyChart".  Sign up information is provided on this After Visit Summary.  MyChart is used to connect with patients for Virtual Visits (Telemedicine).  Patients are able to view lab/test results, encounter notes, upcoming appointments, etc.  Non-urgent messages can be sent to your provider as well.   To learn more about what you can do with MyChart, go to [email or web address].    Your next appointment:   3 month(s)  The format for your next appointment:   In Person  Provider:   Chilton Si, MD   You have been referred to DERMATOLOGY      [facility] Stumpf,acting as a scribe for David Si, MD.,have documented all relevant documentation on the behalf of David Si, MD,as directed by  David Si, MD while in the presence of David Si, MD.  I, Tabbetha Kutscher C. Duke Salvia, MD have reviewed all documentation for this visit.  The documentation of the exam, diagnosis, procedures, and orders on 04/26/2021 are all accurate and complete.   Signed, David Si, MD  04/26/2021 5:02 PM    [facility]

## 2021-03-03 NOTE — Assessment & Plan Note (Signed)
David Scott has 3 dark, irregularly shaped and hyperpigmented lesions on his left lower extremity.  I am concerned that these are melanomas.  He thinks that they are growing and they are not going away.  They have been there for months.  He denies any preceding injury.  We will refer him to dermatology to consider biopsy.

## 2021-03-09 ENCOUNTER — Telehealth (HOSPITAL_BASED_OUTPATIENT_CLINIC_OR_DEPARTMENT_OTHER): Payer: Self-pay | Admitting: Cardiovascular Disease

## 2021-03-09 DIAGNOSIS — Z1389 Encounter for screening for other disorder: Secondary | ICD-10-CM | POA: Diagnosis not present

## 2021-03-09 DIAGNOSIS — I10 Essential (primary) hypertension: Secondary | ICD-10-CM | POA: Diagnosis not present

## 2021-03-09 DIAGNOSIS — Z0000 Encounter for general adult medical examination without abnormal findings: Secondary | ICD-10-CM | POA: Diagnosis not present

## 2021-03-09 DIAGNOSIS — E46 Unspecified protein-calorie malnutrition: Secondary | ICD-10-CM | POA: Diagnosis not present

## 2021-03-09 DIAGNOSIS — L301 Dyshidrosis [pompholyx]: Secondary | ICD-10-CM | POA: Diagnosis not present

## 2021-03-09 DIAGNOSIS — L219 Seborrheic dermatitis, unspecified: Secondary | ICD-10-CM | POA: Diagnosis not present

## 2021-03-09 DIAGNOSIS — H6123 Impacted cerumen, bilateral: Secondary | ICD-10-CM | POA: Diagnosis not present

## 2021-03-09 DIAGNOSIS — R030 Elevated blood-pressure reading, without diagnosis of hypertension: Secondary | ICD-10-CM | POA: Diagnosis not present

## 2021-03-09 DIAGNOSIS — I700 Atherosclerosis of aorta: Secondary | ICD-10-CM | POA: Diagnosis not present

## 2021-03-09 DIAGNOSIS — G4700 Insomnia, unspecified: Secondary | ICD-10-CM | POA: Diagnosis not present

## 2021-03-09 DIAGNOSIS — J449 Chronic obstructive pulmonary disease, unspecified: Secondary | ICD-10-CM | POA: Diagnosis not present

## 2021-03-09 NOTE — Telephone Encounter (Signed)
Left message for patient regarding there 09/01/21 appointment with [address] [place], [place] [address]----[facility] [phone].  Will mail information to patient and requested he call with questions or concerns.

## 2021-03-11 ENCOUNTER — Encounter (HOSPITAL_BASED_OUTPATIENT_CLINIC_OR_DEPARTMENT_OTHER): Payer: Self-pay

## 2021-03-12 DIAGNOSIS — R234 Changes in skin texture: Secondary | ICD-10-CM | POA: Diagnosis not present

## 2021-03-12 DIAGNOSIS — H9222 Otorrhagia, left ear: Secondary | ICD-10-CM | POA: Diagnosis not present

## 2021-03-12 DIAGNOSIS — R030 Elevated blood-pressure reading, without diagnosis of hypertension: Secondary | ICD-10-CM | POA: Diagnosis not present

## 2021-03-16 ENCOUNTER — Ambulatory Visit (INDEPENDENT_AMBULATORY_CARE_PROVIDER_SITE_OTHER): Payer: Medicare Other

## 2021-03-16 ENCOUNTER — Other Ambulatory Visit: Payer: Self-pay

## 2021-03-16 DIAGNOSIS — R0602 Shortness of breath: Secondary | ICD-10-CM

## 2021-03-17 LAB — ECHOCARDIOGRAM COMPLETE
AR max vel: 1.24 cm2
AV Area VTI: 1.3 cm2
AV Area mean vel: 1.22 cm2
AV Mean grad: 4 mmHg
AV Peak grad: 8.2 mmHg
AV Vena cont: 0.35 cm
Ao pk vel: 1.43 m/s
Area-P 1/2: 3.65 cm2
Calc EF: 67.3 %
S' Lateral: 3.1 cm
Single Plane A2C EF: 72.7 %
Single Plane A4C EF: 56.8 %

## 2021-04-05 ENCOUNTER — Emergency Department (HOSPITAL_COMMUNITY)
Admission: EM | Admit: 2021-04-05 | Discharge: 2021-04-05 | Disposition: A | Discharge status: Home / Self Care | Payer: Medicare Other | Attending: Emergency Medicine | Admitting: Emergency Medicine

## 2021-04-05 ENCOUNTER — Other Ambulatory Visit: Payer: Self-pay

## 2021-04-05 ENCOUNTER — Encounter (HOSPITAL_COMMUNITY): Payer: Self-pay | Admitting: Emergency Medicine

## 2021-04-05 ENCOUNTER — Emergency Department (HOSPITAL_COMMUNITY): Payer: Medicare Other

## 2021-04-05 DIAGNOSIS — R0602 Shortness of breath: Secondary | ICD-10-CM | POA: Insufficient documentation

## 2021-04-05 DIAGNOSIS — R002 Palpitations: Secondary | ICD-10-CM | POA: Diagnosis not present

## 2021-04-05 DIAGNOSIS — J90 Pleural effusion, not elsewhere classified: Secondary | ICD-10-CM | POA: Diagnosis not present

## 2021-04-05 DIAGNOSIS — I517 Cardiomegaly: Secondary | ICD-10-CM | POA: Diagnosis not present

## 2021-04-05 DIAGNOSIS — Z7982 Long term (current) use of aspirin: Secondary | ICD-10-CM | POA: Insufficient documentation

## 2021-04-05 DIAGNOSIS — J9811 Atelectasis: Secondary | ICD-10-CM | POA: Diagnosis not present

## 2021-04-05 DIAGNOSIS — R079 Chest pain, unspecified: Secondary | ICD-10-CM | POA: Diagnosis not present

## 2021-04-05 LAB — TROPONIN I (HIGH SENSITIVITY)
Troponin I (High Sensitivity): 12 ng/L (ref <18)
Troponin I (High Sensitivity): 12 ng/L (ref <18)

## 2021-04-05 LAB — COMPREHENSIVE METABOLIC PANEL
ALT: 23 U/L (ref 0–44)
AST: 26 U/L (ref 15–41)
Albumin: 3.9 g/dL (ref 3.5–5.0)
Alkaline Phosphatase: 65 U/L (ref 38–126)
Anion gap: 6 (ref 5–15)
BUN: 42 mg/dL — ABNORMAL HIGH (ref 8–23)
CO2: 30 mmol/L (ref 22–32)
Calcium: 8.9 mg/dL (ref 8.9–10.3)
Chloride: 100 mmol/L (ref 98–111)
Creatinine, Ser: 1.16 mg/dL (ref 0.61–1.24)
GFR, Estimated: >60 mL/min (ref >60)
Glucose, Bld: 95 mg/dL (ref 70–99)
Potassium: 4.7 mmol/L (ref 3.5–5.1)
Sodium: 136 mmol/L (ref 135–145)
Total Bilirubin: 0.5 mg/dL (ref 0.3–1.2)
Total Protein: 6.6 g/dL (ref 6.5–8.1)

## 2021-04-05 LAB — CBC WITH DIFFERENTIAL/PLATELET
Abs Immature Granulocytes: 0.01 10*3/uL (ref 0.00–0.07)
Basophils Absolute: 0 10*3/uL (ref 0.0–0.1)
Basophils Relative: 1 %
Eosinophils Absolute: 0.2 10*3/uL (ref 0.0–0.5)
Eosinophils Relative: 5 %
HCT: 40.7 % (ref 39.0–52.0)
Hemoglobin: 13.4 g/dL (ref 13.0–17.0)
Immature Granulocytes: 0 %
Lymphocytes Relative: 37 %
Lymphs Abs: 1.4 10*3/uL (ref 0.7–4.0)
MCH: 33.6 pg (ref 26.0–34.0)
MCHC: 32.9 g/dL (ref 30.0–36.0)
MCV: 102 fL — ABNORMAL HIGH (ref 80.0–100.0)
Monocytes Absolute: 0.4 10*3/uL (ref 0.1–1.0)
Monocytes Relative: 11 %
Neutro Abs: 1.7 10*3/uL (ref 1.7–7.7)
Neutrophils Relative %: 46 %
Platelets: 200 10*3/uL (ref 150–400)
RBC: 3.99 MIL/uL — ABNORMAL LOW (ref 4.22–5.81)
RDW: 12.9 % (ref 11.5–15.5)
WBC: 3.7 10*3/uL — ABNORMAL LOW (ref 4.0–10.5)
nRBC: 0 % (ref 0.0–0.2)

## 2021-04-05 LAB — BRAIN NATRIURETIC PEPTIDE: B Natriuretic Peptide: 164.5 pg/mL — ABNORMAL HIGH (ref 0.0–100.0)

## 2021-04-05 LAB — LIPASE, BLOOD: Lipase: 49 U/L (ref 11–51)

## 2021-04-05 NOTE — ED Provider Notes (Signed)
Emergency Medicine Provider Triage Evaluation Note  David Scott , a 80 y.o. male  was evaluated in triage.  Pt complains of acute onset chest pain onset around 2:30am.  Pt reports this did not wake him from sleep as he was already awake. Pt reports the pain felt like a tapping on both sides.  He has difficulty describing the discomfort.  No aggravating or alleviating factors.  No treatments PTA.  Review of Systems  Positive: Chest pain Negative: N/V/D  Physical Exam  BP (!) 184/77 (BP Location: Right Arm)   Pulse 65   Temp 98.6 F (37 C) (Oral)   Resp 17   SpO2 100%  Gen:   Awake, no distress   Resp:  Normal effort  MSK:   Moves extremities without difficulty  Other:  Regular rate and rhythm  Medical Decision Making  Medically screening exam initiated at 2:57 AM.  Appropriate orders placed.  David Scott was informed that the remainder of the evaluation will be completed by another provider, this initial triage assessment does not replace that evaluation, and the importance of remaining in the ED until their evaluation is complete.  Chest pain - work-up pending.   David Scott, David Scott 04/05/21 0309    David Conn, MD 04/05/21 [id number]

## 2021-04-05 NOTE — Discharge Instructions (Signed)
It was a pleasure taking care of you here in the emergency department today  Your work-up today was reassuring.  I would recommend taking a full tablet of the furosemide over the next few days, touch base with cardiology and follow-up closely with them  Return for new or worsening symptoms

## 2021-04-05 NOTE — ED Provider Notes (Signed)
[facility] EMERGENCY DEPARTMENT Provider Note   CSN: [id number] Arrival date & time: 04/05/21  0244     History Chief Complaint  Patient presents with   Chest Pain    David Scott is a 80 y.o. male with past medical history significant for CVA, memory problems, supranuclear palsy who presents for evaluation of abnormal sensation to his chest.  Began around 2:30 AM.  Did not wake him up from sleep, David Scott was already awake.  Felt like David Scott had tapping on both sides of his chest.  David Scott denies any actual pain.  No associated shortness of breath, lower extremity swelling.  David Scott has difficulty describing this.  Sx resolved currently. David Scott does not get any exertional or pleuritic chest pain at baseline.  No PND, orthopnea, history of PE or DVT.  No history of arrhythmia.  David Scott is not anticoagulated.  David Scott was started by cardiology on Lasix for pleural effusion, takes 10 mg daily, as needed 20 mg as needed for swelling.  No recent illnesses.  No fever, chills, nausea, vomiting, shortness of breath, cough, back pain, abdominal pain, new paresthesias, new weakness.  Denies additional aggravating or alleviating factors. No history of AAA, dissection, CT AP 9/22 without aortic aneurysm. Does admit to some constipation which is chronic. States if David Scott eats "the right fiber cereal" this helps. Passing flatus currently. No abd pain.  History obtained from patient, family in room and past medical records.  Poor historian  Unfortunately had wait in lobby > 7 hours prior to my evaluation   HPI     Past Medical History:  Diagnosis Date   CVA (cerebral vascular accident) [facility])    Esophageal reflux    Leg fracture    Shortness of breath 03/03/2021   Skin lesion 03/03/2021    Patient Active Problem List   Diagnosis Date Noted   Skin lesion 03/03/2021   Shortness of breath 03/03/2021   Supranuclear palsy ([place]) 05/10/2018   Stroke ([place]) 04/12/2017   Memory loss 02/03/2017   Gait disturbance  02/03/2017   Polyneuropathy 02/03/2017   Numbness 02/03/2017    No past surgical history on file.     Family History  Problem Relation Age of Onset   COPD Father     Social History   Tobacco Use   Smoking status: Never   Smokeless tobacco: Never  Substance Use Topics   Alcohol use: No   Drug use: No    Home Medications Prior to Admission medications   Medication Sig Start Date End Date Taking? Authorizing Provider  acetaminophen (TYLENOL) 325 MG tablet Take 2 tablets (650 mg total) by mouth every 6 (six) hours as needed. 09/20/20   Carlisle Cater, PA-C  albuterol (PROVENTIL HFA;VENTOLIN HFA) 108 (90 Base) MCG/ACT inhaler Inhale 2 puffs into the lungs every 6 (six) hours as needed for wheezing or shortness of breath.  09/09/17   [provider]  aspirin 81 MG tablet Take 81 mg by mouth daily after lunch.     [provider]  Cholecalciferol (VITAMIN D-3 PO) Take 1 capsule by mouth daily after lunch.     [provider]  clotrimazole (LOTRIMIN) 1 % cream Apply to affected area 2 times daily for 7 days. 01/03/21   Long, Wonda Olds, MD  furosemide (LASIX) 20 MG tablet Take 1/2 tablet daily OK to take an extra 1/2 tablet as needed for swelling or shortness of breath 03/03/21   Skeet Latch, MD  Magnesium 250 MG TABS  Take 250 mg by mouth daily.    [provider]  Multiple Vitamin (MULTIVITAMIN) tablet Take 1 tablet by mouth 2 (two) times daily after a meal.     [provider]  vitamin C (ASCORBIC ACID) 500 MG tablet Take 1,000 mg by mouth daily.    [provider]    Allergies    Benzonatate, Tamsulosin hcl, and Septra [sulfamethoxazole-trimethoprim]  Review of Systems   Review of Systems  Constitutional: Negative.   HENT: Negative.    Respiratory: Negative.    Cardiovascular:  Positive for chest pain. Negative for palpitations and leg swelling.  Genitourinary: Negative.   Musculoskeletal: Negative.   Skin: Negative.    Neurological: Negative.   All other systems reviewed and are negative.  Physical Exam Updated Vital Signs BP (!) 154/80 (BP Location: Right Arm)   Pulse 61   Temp 98.6 F (37 C) (Oral)   Resp 16   SpO2 100%   Physical Exam Vitals and nursing note reviewed.  Constitutional:      General: David Scott is not in acute distress.    Appearance: David Scott is well-developed. David Scott is not ill-appearing, toxic-appearing or diaphoretic.  HENT:     Head: Atraumatic.  Eyes:     Pupils: Pupils are equal, round, and reactive to light.  Cardiovascular:     Rate and Rhythm: Normal rate and regular rhythm.     Pulses: Normal pulses.          Radial pulses are 2+ on the right side and 2+ on the left side.       Dorsalis pedis pulses are 2+ on the right side and 2+ on the left side.     Heart sounds: Normal heart sounds.  Pulmonary:     Effort: Pulmonary effort is normal. No respiratory distress.     Breath sounds: Normal breath sounds and air entry.     Comments: Clear bil, speaks in full sentences without difficulty Chest:     Comments: Non tender, no crepitus, step off Abdominal:     General: Bowel sounds are normal. There is no distension.     Palpations: Abdomen is soft.     Tenderness: There is no abdominal tenderness. There is no right CVA tenderness, left CVA tenderness, guarding or rebound.     Comments: Soft non tender. No midline pulsatile abd mass  Musculoskeletal:        General: Normal range of motion.     Cervical back: Normal range of motion and neck supple.     Comments: No bony tenderness, moves extremities.  No lower extremity edema  Skin:    General: Skin is warm and dry.  Neurological:     General: No focal deficit present.     Mental Status: David Scott is alert and oriented to person, place, and time.    ED Results / Procedures / Treatments   Labs (all labs ordered are listed, but only abnormal results are displayed) Labs Reviewed  CBC WITH DIFFERENTIAL/PLATELET - Abnormal; Notable for  the following components:      Result Value   WBC 3.7 (*)    RBC 3.99 (*)    MCV 102.0 (*)    All other components within normal limits  COMPREHENSIVE METABOLIC PANEL - Abnormal; Notable for the following components:   BUN 42 (*)    All other components within normal limits  BRAIN NATRIURETIC PEPTIDE - Abnormal; Notable for the following components:   B Natriuretic Peptide 164.5 (*)  All other components within normal limits  LIPASE, BLOOD  TROPONIN I (HIGH SENSITIVITY)  TROPONIN I (HIGH SENSITIVITY)    EKG EKG Interpretation  Date/Time:  Sunday April 05 2021 09:46:02 EST Ventricular Rate:  61 PR Interval:  194 QRS Duration: 94 QT Interval:  451 QTC Calculation: 455 R Axis:   248 Text Interpretation: Sinus rhythm Left anterior fascicular block Anterolateral infarct, old No significant change since last tracing Confirmed by Gareth Morgan [phone]) on 04/05/2021 10:03:05 AM  Radiology DG Chest 2 View  Result Date: 04/05/2021 CLINICAL DATA:  Chest pain. EXAM: CHEST - 2 VIEW COMPARISON:  Chest radiograph dated 01/03/2021. FINDINGS: Small left pleural effusion and left lung base atelectasis. Pneumonia is not excluded the right lung is clear. No pneumothorax. Mild cardiomegaly. No acute osseous pathology. Degenerative changes of the spine. Osteopenia. IMPRESSION: Small left pleural effusion and left lung base atelectasis or infiltrate. Electronically Signed   By: Anner Crete M.D.   On: 04/05/2021 03:26    Procedures Procedures   Medications Ordered in ED Medications - No data to display  ED Course  I have reviewed the triage vital signs and the nursing notes.  Pertinent labs & imaging results that were available during my care of the patient were reviewed by me and considered in my medical decision making (see chart for details).  Pleasant 80 year old here for evaluation of abnormal sensation to the chest.  David Scott denies any overt pain.  No shortness of breath, nausea,  vomiting, diaphoresis, back pain, paresthesias or weakness.  Lasted a few minutes and self resolved.  By patient's description likely had episode of palpitations.  No arrhythmia here in the emergency department.  Does not appear clinically fluid overloaded.  No risk factors for PE, DVT.  David Scott is currently chest pain-free.  Work-up started from triage today personally reviewed and interpreted CBC WBC 3.7 Metabolic panel without electrolyte, renal normality Lipase 49 BNP 164, similar to prior Trop 12>>12 similar to prior Chest x-ray with small left pleural effusion EKG without ischemic changes  Patient currently asymptomatic.  Work-up reassuring here in ED.  Suspect patient was having some palpitations as cause of his symptoms based off of history.  No arrhythmia here in ED. VSS, no tracheal deviation, no JVD or new murmur, RRR, breath sounds equal bilaterally, EKG without acute abnormalities, negative troponin, and negative CXR. Pt has been advised to return to the ED if CP becomes exertional, associated with diaphoresis or nausea, radiates to left jaw/arm, worsens or becomes concerning in any way.  Low suspicion for acute ACS, PE, dissection, intra-abdominal process as cause of his symptoms.  The patient has been appropriately medically screened and/or stabilized in the ED. I have low suspicion for any other emergent medical condition which would require further screening, evaluation or treatment in the ED or require inpatient management.  Patient is hemodynamically stable and in no acute distress.  Patient able to ambulate in department prior to ED.  Evaluation does not show acute pathology that would require ongoing or additional emergent interventions while in the emergency department or further inpatient treatment.  I have discussed the diagnosis with the patient and answered all questions.  Pain is been managed while in the emergency department and patient has no further complaints prior to  discharge.  Patient is comfortable with plan discussed in room and is stable for discharge at this time.  I have discussed strict return precautions for returning to the emergency department.  Patient was encouraged to follow-up with  PCP/specialist refer to at discharge.     MDM Rules/Calculators/A&P                            Final Clinical Impression(s) / ED Diagnoses Final diagnoses:  Palpitations    Rx / DC Orders ED Discharge Orders     None        Akshath Mccarey A, PA-C 04/05/21 1007    Alvira Monday, MD 04/06/21 0015

## 2021-04-05 NOTE — ED Triage Notes (Signed)
Pt states he was trying to sleep tonight and had a "funny feeling" across his central chest.  Can't really describe it.  No n/v or shob. Son states pt had recent echo to evaluate some suspected "fluid around his heart" but doesn't know the results of this.

## 2021-05-20 DIAGNOSIS — B356 Tinea cruris: Secondary | ICD-10-CM | POA: Diagnosis not present

## 2021-05-20 DIAGNOSIS — H5711 Ocular pain, right eye: Secondary | ICD-10-CM | POA: Diagnosis not present

## 2021-05-20 DIAGNOSIS — G4700 Insomnia, unspecified: Secondary | ICD-10-CM | POA: Diagnosis not present

## 2021-05-20 DIAGNOSIS — L219 Seborrheic dermatitis, unspecified: Secondary | ICD-10-CM | POA: Diagnosis not present

## 2021-05-20 DIAGNOSIS — N401 Enlarged prostate with lower urinary tract symptoms: Secondary | ICD-10-CM | POA: Diagnosis not present

## 2021-06-08 NOTE — Progress Notes (Signed)
Cardiology Office Note:    Date:  06/11/2021   ID:  David Haywardonald R Dagher, DOB 1940/09/07, MRN [id number]  PCP:  Elias Elseeade, Robert, MD   [facility] Providers Cardiologist:  Chilton Siiffany Orleans, MD     Referring MD: Elias Elseeade, Robert, MD   No chief complaint on file.   History of Present Illness:    David Scott is a 81 y.o. male with a hx of asthma, COPD, hypertension, aortic atherosclerosis, and GERD, here for follow-up. He was initially seen 03/03/2021 for the evaluation of shortness of breath and edema. He saw his PCP on 11/2020 and reported feeling more short of breath. He had no edema on exam. His combivent inhaler was refilled and he was referred to cardiology. At a prior visit 10/2020 he had symptomatic bradycardia and left-sided chest pain. He had a heart rate of 53 on EKG. They recommended he go to the ED but he refused.  At his last visit he reported SOB and had LE edema. BNP was mildly elevated so furosemide was increased to 20 mg every day. He had an echo that revealed 60-65% with mild LVH and normal diastolic function. Right atrial pressure was 3 mm Hg. He was noted to have increased trabeculation in the LV Apex. He was seen at the hospital 03/2021 with chest pain that awoke him from sleep . His BP in the ED was 184/77. Troponin was unremarkable and BNP was mildly elevated at 164. Labs were unremarkable and he was discharged home.  He is accompanied by his son. Today, he is feeling pretty good aside from a lot of stress. At home his blood pressure has been well controlled about half the time and elevated otherwise. They note his blood pressure is usually elevated in the ER. Regarding his breathing, he states "it varies." While lying down he is no more short of breath than usual. He is mostly sedentary, but will exercise when he remembers. This morning he exercised on a stationary bike and felt well. He endorses a little bit of lightheadedness and dizziness, worse on some days and more improved  on other days. He is unsure exactly how often he experiences the dizziness. Also, he reports a nodule-like lesion on his left neck/chest that has been present for a while. Additionally, he asks about starting nitric oxide as a supplement. He denies any palpitations, chest pain, headaches, syncope, or PND.   Past Medical History:  Diagnosis Date   Aortic atherosclerosis (HCC) 06/11/2021   Bradycardia 06/11/2021   CVA (cerebral vascular accident) [facility](HCC)    Esophageal reflux    Leg fracture    Shortness of breath 03/03/2021   Skin lesion 03/03/2021    No past surgical history on file.  Current Medications: Current Meds  Medication Sig   acetaminophen (TYLENOL) 325 MG tablet Take 2 tablets (650 mg total) by mouth every 6 (six) hours as needed.   albuterol (PROVENTIL HFA;VENTOLIN HFA) 108 (90 Base) MCG/ACT inhaler Inhale 2 puffs into the lungs every 6 (six) hours as needed for wheezing or shortness of breath.    aspirin 81 MG tablet Take 81 mg by mouth daily after lunch.    Cholecalciferol (VITAMIN D-3 PO) Take 1 capsule by mouth daily after lunch.    clotrimazole (LOTRIMIN) 1 % cream Apply to affected area 2 times daily for 7 days.   furosemide (LASIX) 20 MG tablet Take 1/2 tablet daily OK to take an extra 1/2 tablet as needed for swelling or shortness of breath  Magnesium 250 MG TABS Take 250 mg by mouth daily.   Multiple Vitamin (MULTIVITAMIN) tablet Take 1 tablet by mouth 2 (two) times daily after a meal.    vitamin C (ASCORBIC ACID) 500 MG tablet Take 1,000 mg by mouth daily.     Allergies:   Benzonatate, Tamsulosin hcl, and Septra [sulfamethoxazole-trimethoprim]   Social History   Socioeconomic History   Marital status: Single    Spouse name: Not on file   Number of children: Not on file   Years of education: Not on file   Highest education level: Not on file  Occupational History   Not on file  Tobacco Use   Smoking status: Never   Smokeless tobacco: Never  Substance and  Sexual Activity   Alcohol use: No   Drug use: No   Sexual activity: Not on file  Other Topics Concern   Not on file  Social History Narrative   Not on file   Social Determinants of Health   Financial Resource Strain: Not on file  Food Insecurity: Not on file  Transportation Needs: Not on file  Physical Activity: Not on file  Stress: Not on file  Social Connections: Not on file     Family History: The patient's family history includes COPD in his father.  ROS:   Please see the history of present illness.    (+) Stress (+) Shortness of breath (+) Lightheadedness/dizziness All other systems reviewed and are negative.  EKGs/Labs/Other Studies Reviewed:    The following studies were reviewed today:  Echo 03/16/2021: Sonographer Comments: Technically difficult study due to poor echo  windows. Image acquisition challenging due to patient body habitus and  pectum excavatum with kyphosis.  IMPRESSIONS    1. Left ventricular ejection fraction, by estimation, is 60 to 65%. The  left ventricle has normal function. The left ventricle has no regional  wall motion abnormalities. There is mild left ventricular hypertrophy.  Left ventricular diastolic parameters  were normal. Apical hypertrabeculation.   2. Right ventricular systolic function is normal. The right ventricular  size is normal. Tricuspid regurgitation signal is inadequate for assessing  PA pressure.   3. The mitral valve is normal in structure. Mild mitral valve  regurgitation. No evidence of mitral stenosis.   4. The aortic valve was not well visualized. Aortic valve regurgitation  is mild. No aortic stenosis is present.   5. The inferior vena cava is normal in size with greater than 50%  respiratory variability, suggesting right atrial pressure of 3 mmHg.   Comparison(s): EF 55%.    EKG:    06/11/2021: EKG was not ordered. 03/03/2021: EKG was not ordered.  Recent Labs: 04/05/2021: ALT 23; B Natriuretic  Peptide 164.5; BUN 42; Creatinine, Ser 1.16; Hemoglobin 13.4; Platelets 200; Potassium 4.7; Sodium 136   Recent Lipid Panel No results found for: CHOL, TRIG, HDL, CHOLHDL, VLDL, LDLCALC, LDLDIRECT       Physical Exam:    Wt Readings from Last 3 Encounters:  06/11/21 124 lb 1.6 oz (56.3 kg)  03/03/21 122 lb 14.4 oz (55.7 kg)  11/08/18 132 lb (59.9 kg)     VS:  BP 116/60 (BP Location: Right Arm, Patient Position: Sitting, Cuff Size: Normal)    Pulse (!) 49    Ht 5\' 10"  (1.778 m)    Wt 124 lb 1.6 oz (56.3 kg)    SpO2 98%    BMI 17.81 kg/m  , BMI Body mass index is 17.81 kg/m. GENERAL:  Well appearing  HEENT: Pupils equal round and reactive, fundi not visualized, oral mucosa unremarkable NECK:  No jugular venous distention, waveform within normal limits, carotid upstroke brisk and symmetric, no bruits, no thyromegaly.  LUNGS:  Clear to auscultation bilaterally HEART:  RRR.  PMI not displaced or sustained,S1 and S2 within normal limits, no S3, no S4, no clicks, no rubs, No murmurs ABD:  Flat, positive bowel sounds normal in frequency in pitch, no bruits, no rebound, no guarding, no midline pulsatile mass, no hepatomegaly, no splenomegaly EXT:  2 plus pulses throughout, no edema, no cyanosis no clubbing SKIN:  No rashes, 1 cm nodule on left neck  NEURO:  Cranial nerves II through XII grossly intact, motor grossly intact throughout PSYCH:  Cognitively intact, oriented to person place and time   ASSESSMENT:    1. Bradycardia   2. Aortic atherosclerosis (HCC)     PLAN:    Bradycardia Heart rate 49 bpm.  He has dizziness which could be related.  No syncope or falls.  Check TSH, T3, free T4.  We will get 30 day Zio at assess for associated symptolms.    Disposition: FU with Lisl Slingerland C. Duke Salviaandolph, MD, [facility] in 2-3 months.  Medication Adjustments/Labs and Tests Ordered: Current medicines are reviewed at length with the patient today.  Concerns regarding medicines are outlined above.    Orders Placed This Encounter  Procedures   T4, free   TSH   T3   LONG TERM MONITOR (3-14 DAYS)   No orders of the defined types were placed in this encounter.  Patient Instructions  Medication Instructions:  Your physician recommends that you continue on your current medications as directed. Please refer to the Current Medication list given to you today.  *If you need a refill on your cardiac medications before your next appointment, please call your pharmacy*  Lab Work: TSH/FT4/T3 TODAY   If you have labs (blood work) drawn today and your tests are completely normal, you will receive your results only by: MyChart Message (if you have MyChart) OR A paper copy in the mail If you have any lab test that is abnormal or we need to change your treatment, we will call you to review the results.  Testing/Procedures: 30 DAY ZIO MONITOR   Follow-Up: [facility], you and your health needs are our priority.  As part of our continuing mission to provide you with exceptional heart care, we have created designated Provider Care Teams.  These Care Teams include your primary Cardiologist (physician) and Advanced Practice Providers (APPs -  Physician Assistants and Nurse Practitioners) who all work together to provide you with the care you need, when you need it.  We recommend signing up for the patient portal called "MyChart".  Sign up information is provided on this After Visit Summary.  MyChart is used to connect with patients for Virtual Visits (Telemedicine).  Patients are able to view lab/test results, encounter notes, upcoming appointments, etc.  Non-urgent messages can be sent to your provider as well.   To learn more about what you can do with MyChart, go to [email or web address].    Your next appointment:   3 month(s)  The format for your next appointment:   In Person  Provider:   Chilton Siiffany Hood, MD{   Other Instructions  ZIO XT- Long Term Monitor  Instructions  Your physician has requested you wear a ZIO patch monitor for 14 days.  This is a single patch monitor. [employer] supplies one patch monitor per enrollment. Additional  stickers are not available. Please do not apply patch if you will be having a Nuclear Stress Test,  Echocardiogram, Cardiac CT, MRI, or Chest Xray during the period you would be wearing the  monitor. The patch cannot be worn during these tests. You cannot remove and re-apply the  ZIO XT patch monitor.  Your ZIO patch monitor will be mailed 3 day USPS to your address on file. It may take 3-5 days  to receive your monitor after you have been enrolled.  Once you have received your monitor, please review the enclosed instructions. Your monitor  has already been registered assigning a specific monitor serial # to you.  Billing and Patient Assistance Program Information  We have supplied [employer] with any of your insurance information on file for billing purposes. [employer] offers a sliding scale Patient Assistance Program for patients that do not have  insurance, or whose insurance does not completely cover the cost of the ZIO monitor.  You must apply for the Patient Assistance Program to qualify for this discounted rate.  To apply, please call [employer] at [phone], select option 4, select option 2, ask to apply for  Patient Assistance Program. Meredeth Ide will ask your household income, and how many people  are in your household. They will quote your out-of-pocket cost based on that information.  [employer] will also be able to set up a 84-month, interest-free payment plan if needed.  Applying the monitor   Shave hair from upper left chest.  Hold abrader disc by orange tab. Rub abrader in 40 strokes over the upper left chest as  indicated in your monitor instructions.  Clean area with 4 enclosed alcohol pads. Let dry.  Apply patch as indicated in monitor instructions. Patch will be placed under collarbone on left  side  of chest with arrow pointing upward.  Rub patch adhesive wings for 2 minutes. Remove white label marked "1". Remove the white  label marked "2". Rub patch adhesive wings for 2 additional minutes.  While looking in a mirror, press and release button in center of patch. A small green light will  flash 3-4 times. This will be your only indicator that the monitor has been turned on.  Do not shower for the first 24 hours. You may shower after the first 24 hours.  Press the button if you feel a symptom. You will hear a small click. Record Date, Time and  Symptom in the Patient Logbook.  When you are ready to remove the patch, follow instructions on the last 2 pages of Patient  Logbook. Stick patch monitor onto the last page of Patient Logbook.  Place Patient Logbook in the blue and white box. Use locking tab on box and tape box closed  securely. The blue and white box has prepaid postage on it. Please place it in the mailbox as  soon as possible. Your physician should have your test results approximately 7 days after the  monitor has been mailed back to [facility].  Call [facility] : [facility] Customer Care at [phone] if you have questions regarding  your ZIO XT patch monitor. Call them immediately if you see an orange light blinking on your  monitor.  If your monitor falls off in less than 4 days, contact our Monitor department at [phone].  If your monitor becomes loose or falls off after 4 days call [employer] at [phone] for  suggestions on securing your monitor    I,Mathew Stumpf,acting as a scribe for Chilton Si, MD.,have documented all relevant  documentation on the behalf of Chilton Si, MD,as directed by  Chilton Si, MD while in the presence of Chilton Si, MD.  I, Miette Molenda C. Duke Salvia, MD have reviewed all documentation for this visit.  The documentation of the exam, diagnosis, procedures, and orders on 06/11/2021 are all accurate and  complete.  Signed, Chilton Si, MD  06/11/2021 5:45 PM    [facility]

## 2021-06-11 ENCOUNTER — Encounter (HOSPITAL_BASED_OUTPATIENT_CLINIC_OR_DEPARTMENT_OTHER): Payer: Self-pay | Admitting: Cardiovascular Disease

## 2021-06-11 ENCOUNTER — Other Ambulatory Visit: Payer: Self-pay

## 2021-06-11 ENCOUNTER — Ambulatory Visit (INDEPENDENT_AMBULATORY_CARE_PROVIDER_SITE_OTHER): Payer: Medicare Other | Admitting: Cardiovascular Disease

## 2021-06-11 DIAGNOSIS — R0602 Shortness of breath: Secondary | ICD-10-CM

## 2021-06-11 DIAGNOSIS — R001 Bradycardia, unspecified: Secondary | ICD-10-CM | POA: Diagnosis not present

## 2021-06-11 DIAGNOSIS — I700 Atherosclerosis of aorta: Secondary | ICD-10-CM | POA: Diagnosis not present

## 2021-06-11 HISTORY — DX: Bradycardia, unspecified: R00.1

## 2021-06-11 HISTORY — DX: Atherosclerosis of aorta: I70.0

## 2021-06-11 NOTE — Assessment & Plan Note (Signed)
Heart rate 49 bpm.  He has dizziness which could be related.  No syncope or falls.  Check TSH, T3, free T4.  We will get 30 day Zio at assess for associated symptolms.

## 2021-06-11 NOTE — Assessment & Plan Note (Signed)
Symptoms have been stable.  He appears to be euvolemic today.  His volume status is very difficult to tell.  BNP has been mildly elevated in the past.  However there is no evidence of any significant heart failure on his echo.  Continue to monitor.

## 2021-06-11 NOTE — Patient Instructions (Signed)
Medication Instructions:  Your physician recommends that you continue on your current medications as directed. Please refer to the Current Medication list given to you today.  *If you need a refill on your cardiac medications before your next appointment, please call your pharmacy*  Lab Work: TSH/FT4/T3 TODAY   If you have labs (blood work) drawn today and your tests are completely normal, you will receive your results only by: MyChart Message (if you have MyChart) OR A paper copy in the mail If you have any lab test that is abnormal or we need to change your treatment, we will call you to review the results.  Testing/Procedures: 30 DAY ZIO MONITOR   Follow-Up: At [facility], you and your health needs are our priority.  As part of our continuing mission to provide you with exceptional heart care, we have created designated Provider Care Teams.  These Care Teams include your primary Cardiologist (physician) and Advanced Practice Providers (APPs -  Physician Assistants and Nurse Practitioners) who all work together to provide you with the care you need, when you need it.  We recommend signing up for the patient portal called "MyChart".  Sign up information is provided on this After Visit Summary.  MyChart is used to connect with patients for Virtual Visits (Telemedicine).  Patients are able to view lab/test results, encounter notes, upcoming appointments, etc.  Non-urgent messages can be sent to your provider as well.   To learn more about what you can do with MyChart, go to [email or web address].    Your next appointment:   3 month(s)  The format for your next appointment:   In Person  Provider:   Chilton Si, MD{   Other Instructions  ZIO XT- Long Term Monitor Instructions  Your physician has requested you wear a ZIO patch monitor for 14 days.  This is a single patch monitor. [employer] supplies one patch monitor per enrollment. Additional stickers are not available.  Please do not apply patch if you will be having a Nuclear Stress Test,  Echocardiogram, Cardiac CT, MRI, or Chest Xray during the period you would be wearing the  monitor. The patch cannot be worn during these tests. You cannot remove and re-apply the  ZIO XT patch monitor.  Your ZIO patch monitor will be mailed 3 day USPS to your address on file. It may take 3-5 days  to receive your monitor after you have been enrolled.  Once you have received your monitor, please review the enclosed instructions. Your monitor  has already been registered assigning a specific monitor serial # to you.  Billing and Patient Assistance Program Information  We have supplied [employer] with any of your insurance information on file for billing purposes. [employer] offers a sliding scale Patient Assistance Program for patients that do not have  insurance, or whose insurance does not completely cover the cost of the ZIO monitor.  You must apply for the Patient Assistance Program to qualify for this discounted rate.  To apply, please call [employer] at [phone], select option 4, select option 2, ask to apply for  Patient Assistance Program. Meredeth Ide will ask your household income, and how many people  are in your household. They will quote your out-of-pocket cost based on that information.  [employer] will also be able to set up a 75-month, interest-free payment plan if needed.  Applying the monitor   Shave hair from upper left chest.  Hold abrader disc by orange tab. Rub abrader in 40 strokes over the upper left  chest as  indicated in your monitor instructions.  Clean area with 4 enclosed alcohol pads. Let dry.  Apply patch as indicated in monitor instructions. Patch will be placed under collarbone on left  side of chest with arrow pointing upward.  Rub patch adhesive wings for 2 minutes. Remove white label marked "1". Remove the white  label marked "2". Rub patch adhesive wings for 2 additional minutes.  While  looking in a mirror, press and release button in center of patch. A small green light will  flash 3-4 times. This will be your only indicator that the monitor has been turned on.  Do not shower for the first 24 hours. You may shower after the first 24 hours.  Press the button if you feel a symptom. You will hear a small click. Record Date, Time and  Symptom in the Patient Logbook.  When you are ready to remove the patch, follow instructions on the last 2 pages of Patient  Logbook. Stick patch monitor onto the last page of Patient Logbook.  Place Patient Logbook in the blue and white box. Use locking tab on box and tape box closed  securely. The blue and white box has prepaid postage on it. Please place it in the mailbox as  soon as possible. Your physician should have your test results approximately 7 days after the  monitor has been mailed back to [facility].  Call [facility] Customer Care at [phone] if you have questions regarding  your ZIO XT patch monitor. Call them immediately if you see an orange light blinking on your  monitor.  If your monitor falls off in less than 4 days, contact our Monitor department at [phone].  If your monitor becomes loose or falls off after 4 days call [employer] at [phone] for  suggestions on securing your monitor

## 2021-06-12 ENCOUNTER — Ambulatory Visit (INDEPENDENT_AMBULATORY_CARE_PROVIDER_SITE_OTHER): Payer: Medicare Other

## 2021-06-12 DIAGNOSIS — R001 Bradycardia, unspecified: Secondary | ICD-10-CM

## 2021-06-12 LAB — T4, FREE: Free T4: 1.14 ng/dL (ref 0.82–1.77)

## 2021-06-12 LAB — T3: T3, Total: 72 ng/dL (ref 71–180)

## 2021-06-12 LAB — TSH: TSH: 0.713 u[IU]/mL (ref 0.450–4.500)

## 2021-06-12 NOTE — Progress Notes (Unsigned)
Enrolled pt for 14 day Zio XT to be mailed to home address. °

## 2021-06-18 ENCOUNTER — Other Ambulatory Visit (HOSPITAL_BASED_OUTPATIENT_CLINIC_OR_DEPARTMENT_OTHER): Payer: Self-pay | Admitting: *Deleted

## 2021-06-18 DIAGNOSIS — R001 Bradycardia, unspecified: Secondary | ICD-10-CM

## 2021-06-24 DIAGNOSIS — R001 Bradycardia, unspecified: Secondary | ICD-10-CM

## 2021-06-26 ENCOUNTER — Other Ambulatory Visit (HOSPITAL_BASED_OUTPATIENT_CLINIC_OR_DEPARTMENT_OTHER): Payer: Self-pay | Admitting: *Deleted

## 2021-06-26 DIAGNOSIS — R001 Bradycardia, unspecified: Secondary | ICD-10-CM

## 2021-06-29 DIAGNOSIS — R21 Rash and other nonspecific skin eruption: Secondary | ICD-10-CM | POA: Diagnosis not present

## 2021-06-29 DIAGNOSIS — B372 Candidiasis of skin and nail: Secondary | ICD-10-CM | POA: Diagnosis not present

## 2021-07-01 ENCOUNTER — Ambulatory Visit: Payer: Medicare Other

## 2021-07-02 ENCOUNTER — Ambulatory Visit (INDEPENDENT_AMBULATORY_CARE_PROVIDER_SITE_OTHER): Payer: Medicare Other

## 2021-07-02 ENCOUNTER — Other Ambulatory Visit: Payer: Self-pay | Admitting: Cardiovascular Disease

## 2021-07-02 ENCOUNTER — Other Ambulatory Visit: Payer: Self-pay | Admitting: Internal Medicine

## 2021-07-02 DIAGNOSIS — R001 Bradycardia, unspecified: Secondary | ICD-10-CM

## 2021-07-02 NOTE — Progress Notes (Unsigned)
Enrolled patient for a 14 day Zio XT  monitor to be mailed to patients home  °

## 2021-07-15 DIAGNOSIS — R001 Bradycardia, unspecified: Secondary | ICD-10-CM | POA: Diagnosis not present

## 2021-07-16 DIAGNOSIS — I10 Essential (primary) hypertension: Secondary | ICD-10-CM | POA: Diagnosis not present

## 2021-07-16 DIAGNOSIS — J449 Chronic obstructive pulmonary disease, unspecified: Secondary | ICD-10-CM | POA: Diagnosis not present

## 2021-07-25 DIAGNOSIS — R001 Bradycardia, unspecified: Secondary | ICD-10-CM | POA: Diagnosis not present

## 2021-08-06 ENCOUNTER — Telehealth (HOSPITAL_BASED_OUTPATIENT_CLINIC_OR_DEPARTMENT_OTHER): Payer: Self-pay

## 2021-08-06 NOTE — Telephone Encounter (Addendum)
Called results to patient and left results on VM (ok per DPR), instructions left to call office back if patient has any questions!  ? ? ? ?----- Message from Chilton Si, MD sent at 08/04/2021  4:57 PM EDT ----- ?Extra heartbeats from the top and bottom chambers of the heart.  Heart rates sometimes alternate quickly from fast to slow, but no significant pauses.  Continue to monitor for now. ?

## 2021-08-14 DIAGNOSIS — R001 Bradycardia, unspecified: Secondary | ICD-10-CM | POA: Diagnosis not present

## 2021-08-18 DIAGNOSIS — R42 Dizziness and giddiness: Secondary | ICD-10-CM | POA: Diagnosis not present

## 2021-08-18 DIAGNOSIS — R519 Headache, unspecified: Secondary | ICD-10-CM | POA: Diagnosis not present

## 2021-08-21 ENCOUNTER — Encounter (HOSPITAL_COMMUNITY): Payer: Self-pay | Admitting: Emergency Medicine

## 2021-08-21 ENCOUNTER — Emergency Department (HOSPITAL_COMMUNITY)
Admission: EM | Admit: 2021-08-21 | Discharge: 2021-08-22 | Disposition: A | Discharge status: Home / Self Care | Payer: Medicare Other | Source: Home / Self Care | Attending: Emergency Medicine | Admitting: Emergency Medicine

## 2021-08-21 ENCOUNTER — Other Ambulatory Visit: Payer: Self-pay

## 2021-08-21 DIAGNOSIS — Z20822 Contact with and (suspected) exposure to covid-19: Secondary | ICD-10-CM | POA: Diagnosis present

## 2021-08-21 DIAGNOSIS — I959 Hypotension, unspecified: Secondary | ICD-10-CM | POA: Diagnosis not present

## 2021-08-21 DIAGNOSIS — R0789 Other chest pain: Secondary | ICD-10-CM | POA: Diagnosis not present

## 2021-08-21 DIAGNOSIS — R5383 Other fatigue: Secondary | ICD-10-CM | POA: Insufficient documentation

## 2021-08-21 DIAGNOSIS — R251 Tremor, unspecified: Secondary | ICD-10-CM | POA: Insufficient documentation

## 2021-08-21 DIAGNOSIS — R2689 Other abnormalities of gait and mobility: Secondary | ICD-10-CM | POA: Diagnosis not present

## 2021-08-21 DIAGNOSIS — K469 Unspecified abdominal hernia without obstruction or gangrene: Secondary | ICD-10-CM | POA: Insufficient documentation

## 2021-08-21 DIAGNOSIS — M5030 Other cervical disc degeneration, unspecified cervical region: Secondary | ICD-10-CM | POA: Diagnosis not present

## 2021-08-21 DIAGNOSIS — K5900 Constipation, unspecified: Secondary | ICD-10-CM | POA: Diagnosis present

## 2021-08-21 DIAGNOSIS — Z043 Encounter for examination and observation following other accident: Secondary | ICD-10-CM | POA: Diagnosis not present

## 2021-08-21 DIAGNOSIS — D539 Nutritional anemia, unspecified: Secondary | ICD-10-CM | POA: Diagnosis not present

## 2021-08-21 DIAGNOSIS — I69351 Hemiplegia and hemiparesis following cerebral infarction affecting right dominant side: Secondary | ICD-10-CM | POA: Diagnosis not present

## 2021-08-21 DIAGNOSIS — Z66 Do not resuscitate: Secondary | ICD-10-CM | POA: Diagnosis present

## 2021-08-21 DIAGNOSIS — M2550 Pain in unspecified joint: Secondary | ICD-10-CM | POA: Diagnosis not present

## 2021-08-21 DIAGNOSIS — R609 Edema, unspecified: Secondary | ICD-10-CM | POA: Diagnosis not present

## 2021-08-21 DIAGNOSIS — R531 Weakness: Secondary | ICD-10-CM | POA: Diagnosis not present

## 2021-08-21 DIAGNOSIS — F05 Delirium due to known physiological condition: Secondary | ICD-10-CM | POA: Diagnosis not present

## 2021-08-21 DIAGNOSIS — S199XXA Unspecified injury of neck, initial encounter: Secondary | ICD-10-CM | POA: Diagnosis not present

## 2021-08-21 DIAGNOSIS — M6281 Muscle weakness (generalized): Secondary | ICD-10-CM | POA: Diagnosis not present

## 2021-08-21 DIAGNOSIS — K5730 Diverticulosis of large intestine without perforation or abscess without bleeding: Secondary | ICD-10-CM | POA: Diagnosis not present

## 2021-08-21 DIAGNOSIS — R079 Chest pain, unspecified: Secondary | ICD-10-CM | POA: Diagnosis not present

## 2021-08-21 DIAGNOSIS — E46 Unspecified protein-calorie malnutrition: Secondary | ICD-10-CM | POA: Diagnosis not present

## 2021-08-21 DIAGNOSIS — I69354 Hemiplegia and hemiparesis following cerebral infarction affecting left non-dominant side: Secondary | ICD-10-CM | POA: Diagnosis not present

## 2021-08-21 DIAGNOSIS — W1789XA Other fall from one level to another, initial encounter: Secondary | ICD-10-CM | POA: Diagnosis present

## 2021-08-21 DIAGNOSIS — Z7189 Other specified counseling: Secondary | ICD-10-CM | POA: Diagnosis not present

## 2021-08-21 DIAGNOSIS — R4182 Altered mental status, unspecified: Secondary | ICD-10-CM | POA: Diagnosis not present

## 2021-08-21 DIAGNOSIS — Z7982 Long term (current) use of aspirin: Secondary | ICD-10-CM | POA: Diagnosis not present

## 2021-08-21 DIAGNOSIS — I69828 Other speech and language deficits following other cerebrovascular disease: Secondary | ICD-10-CM | POA: Diagnosis not present

## 2021-08-21 DIAGNOSIS — S42294A Other nondisplaced fracture of upper end of right humerus, initial encounter for closed fracture: Secondary | ICD-10-CM | POA: Diagnosis not present

## 2021-08-21 DIAGNOSIS — R64 Cachexia: Secondary | ICD-10-CM | POA: Diagnosis present

## 2021-08-21 DIAGNOSIS — S42211A Unspecified displaced fracture of surgical neck of right humerus, initial encounter for closed fracture: Secondary | ICD-10-CM | POA: Diagnosis not present

## 2021-08-21 DIAGNOSIS — N179 Acute kidney failure, unspecified: Secondary | ICD-10-CM | POA: Diagnosis present

## 2021-08-21 DIAGNOSIS — K439 Ventral hernia without obstruction or gangrene: Secondary | ICD-10-CM

## 2021-08-21 DIAGNOSIS — S42201A Unspecified fracture of upper end of right humerus, initial encounter for closed fracture: Secondary | ICD-10-CM | POA: Diagnosis not present

## 2021-08-21 DIAGNOSIS — S42214A Unspecified nondisplaced fracture of surgical neck of right humerus, initial encounter for closed fracture: Secondary | ICD-10-CM | POA: Diagnosis present

## 2021-08-21 DIAGNOSIS — Z79899 Other long term (current) drug therapy: Secondary | ICD-10-CM | POA: Insufficient documentation

## 2021-08-21 DIAGNOSIS — R109 Unspecified abdominal pain: Secondary | ICD-10-CM | POA: Diagnosis not present

## 2021-08-21 DIAGNOSIS — W19XXXA Unspecified fall, initial encounter: Secondary | ICD-10-CM | POA: Diagnosis not present

## 2021-08-21 DIAGNOSIS — R296 Repeated falls: Secondary | ICD-10-CM | POA: Diagnosis not present

## 2021-08-21 DIAGNOSIS — Z681 Body mass index (BMI) 19 or less, adult: Secondary | ICD-10-CM | POA: Diagnosis not present

## 2021-08-21 DIAGNOSIS — M6259 Muscle wasting and atrophy, not elsewhere classified, multiple sites: Secondary | ICD-10-CM | POA: Diagnosis not present

## 2021-08-21 DIAGNOSIS — R278 Other lack of coordination: Secondary | ICD-10-CM | POA: Diagnosis present

## 2021-08-21 DIAGNOSIS — S42201D Unspecified fracture of upper end of right humerus, subsequent encounter for fracture with routine healing: Secondary | ICD-10-CM | POA: Diagnosis not present

## 2021-08-21 DIAGNOSIS — R627 Adult failure to thrive: Secondary | ICD-10-CM | POA: Diagnosis present

## 2021-08-21 DIAGNOSIS — N289 Disorder of kidney and ureter, unspecified: Secondary | ICD-10-CM | POA: Diagnosis not present

## 2021-08-21 DIAGNOSIS — K219 Gastro-esophageal reflux disease without esophagitis: Secondary | ICD-10-CM | POA: Diagnosis present

## 2021-08-21 DIAGNOSIS — R54 Age-related physical debility: Secondary | ICD-10-CM | POA: Diagnosis present

## 2021-08-21 DIAGNOSIS — Z7401 Bed confinement status: Secondary | ICD-10-CM | POA: Diagnosis not present

## 2021-08-21 DIAGNOSIS — R1312 Dysphagia, oropharyngeal phase: Secondary | ICD-10-CM | POA: Diagnosis not present

## 2021-08-21 DIAGNOSIS — E43 Unspecified severe protein-calorie malnutrition: Secondary | ICD-10-CM | POA: Diagnosis present

## 2021-08-21 DIAGNOSIS — S0990XA Unspecified injury of head, initial encounter: Secondary | ICD-10-CM | POA: Diagnosis not present

## 2021-08-21 DIAGNOSIS — I700 Atherosclerosis of aorta: Secondary | ICD-10-CM | POA: Diagnosis present

## 2021-08-21 DIAGNOSIS — L8990 Pressure ulcer of unspecified site, unspecified stage: Secondary | ICD-10-CM | POA: Diagnosis not present

## 2021-08-21 DIAGNOSIS — R0602 Shortness of breath: Secondary | ICD-10-CM | POA: Diagnosis not present

## 2021-08-21 DIAGNOSIS — K4090 Unilateral inguinal hernia, without obstruction or gangrene, not specified as recurrent: Secondary | ICD-10-CM | POA: Diagnosis not present

## 2021-08-21 DIAGNOSIS — L89152 Pressure ulcer of sacral region, stage 2: Secondary | ICD-10-CM | POA: Diagnosis present

## 2021-08-21 DIAGNOSIS — R41841 Cognitive communication deficit: Secondary | ICD-10-CM | POA: Diagnosis not present

## 2021-08-21 DIAGNOSIS — G253 Myoclonus: Secondary | ICD-10-CM | POA: Diagnosis not present

## 2021-08-21 DIAGNOSIS — E860 Dehydration: Secondary | ICD-10-CM | POA: Diagnosis present

## 2021-08-21 DIAGNOSIS — R262 Difficulty in walking, not elsewhere classified: Secondary | ICD-10-CM | POA: Diagnosis not present

## 2021-08-21 DIAGNOSIS — G9341 Metabolic encephalopathy: Secondary | ICD-10-CM | POA: Diagnosis not present

## 2021-08-21 DIAGNOSIS — R5381 Other malaise: Secondary | ICD-10-CM | POA: Diagnosis not present

## 2021-08-21 DIAGNOSIS — Z515 Encounter for palliative care: Secondary | ICD-10-CM | POA: Diagnosis not present

## 2021-08-21 DIAGNOSIS — I6930 Unspecified sequelae of cerebral infarction: Secondary | ICD-10-CM | POA: Diagnosis not present

## 2021-08-21 DIAGNOSIS — R410 Disorientation, unspecified: Secondary | ICD-10-CM | POA: Diagnosis not present

## 2021-08-21 DIAGNOSIS — S42294D Other nondisplaced fracture of upper end of right humerus, subsequent encounter for fracture with routine healing: Secondary | ICD-10-CM | POA: Diagnosis not present

## 2021-08-21 DIAGNOSIS — N400 Enlarged prostate without lower urinary tract symptoms: Secondary | ICD-10-CM | POA: Diagnosis not present

## 2021-08-21 DIAGNOSIS — S4981XA Other specified injuries of right shoulder and upper arm, initial encounter: Secondary | ICD-10-CM | POA: Diagnosis not present

## 2021-08-21 NOTE — ED Triage Notes (Signed)
Pt presented to ED with c/o constipation x 2 or 3 days, excessive gassiness and lack of appetite. Pt states he can only eat half of what he normally eats. Pt also states he had left to mid-sternal chest pain earlier in the day that is now subsided. States he is on a fluid restriction but cannot explain why. Pt overall is poor historian.  ?

## 2021-08-22 ENCOUNTER — Emergency Department (HOSPITAL_COMMUNITY): Payer: Medicare Other

## 2021-08-22 ENCOUNTER — Encounter (HOSPITAL_COMMUNITY): Payer: Self-pay | Admitting: Emergency Medicine

## 2021-08-22 ENCOUNTER — Encounter (HOSPITAL_COMMUNITY): Payer: Self-pay

## 2021-08-22 ENCOUNTER — Emergency Department (HOSPITAL_COMMUNITY)
Admission: EM | Admit: 2021-08-22 | Discharge: 2021-08-22 | Disposition: A | Discharge status: Home / Self Care | Payer: Medicare Other | Source: Home / Self Care | Attending: Emergency Medicine | Admitting: Emergency Medicine

## 2021-08-22 ENCOUNTER — Other Ambulatory Visit: Payer: Self-pay

## 2021-08-22 DIAGNOSIS — R609 Edema, unspecified: Secondary | ICD-10-CM | POA: Diagnosis not present

## 2021-08-22 DIAGNOSIS — S42201A Unspecified fracture of upper end of right humerus, initial encounter for closed fracture: Secondary | ICD-10-CM | POA: Diagnosis not present

## 2021-08-22 DIAGNOSIS — R109 Unspecified abdominal pain: Secondary | ICD-10-CM | POA: Diagnosis not present

## 2021-08-22 DIAGNOSIS — R079 Chest pain, unspecified: Secondary | ICD-10-CM | POA: Diagnosis not present

## 2021-08-22 DIAGNOSIS — W19XXXA Unspecified fall, initial encounter: Secondary | ICD-10-CM | POA: Insufficient documentation

## 2021-08-22 DIAGNOSIS — S42294A Other nondisplaced fracture of upper end of right humerus, initial encounter for closed fracture: Secondary | ICD-10-CM | POA: Diagnosis not present

## 2021-08-22 DIAGNOSIS — K5730 Diverticulosis of large intestine without perforation or abscess without bleeding: Secondary | ICD-10-CM | POA: Diagnosis not present

## 2021-08-22 DIAGNOSIS — S0990XA Unspecified injury of head, initial encounter: Secondary | ICD-10-CM | POA: Diagnosis not present

## 2021-08-22 DIAGNOSIS — R102 Pelvic and perineal pain: Secondary | ICD-10-CM | POA: Insufficient documentation

## 2021-08-22 DIAGNOSIS — S4981XA Other specified injuries of right shoulder and upper arm, initial encounter: Secondary | ICD-10-CM | POA: Diagnosis not present

## 2021-08-22 DIAGNOSIS — R410 Disorientation, unspecified: Secondary | ICD-10-CM | POA: Diagnosis not present

## 2021-08-22 DIAGNOSIS — Z043 Encounter for examination and observation following other accident: Secondary | ICD-10-CM | POA: Diagnosis not present

## 2021-08-22 DIAGNOSIS — N400 Enlarged prostate without lower urinary tract symptoms: Secondary | ICD-10-CM | POA: Diagnosis not present

## 2021-08-22 DIAGNOSIS — S42291A Other displaced fracture of upper end of right humerus, initial encounter for closed fracture: Secondary | ICD-10-CM | POA: Insufficient documentation

## 2021-08-22 DIAGNOSIS — S199XXA Unspecified injury of neck, initial encounter: Secondary | ICD-10-CM | POA: Diagnosis not present

## 2021-08-22 DIAGNOSIS — Z7982 Long term (current) use of aspirin: Secondary | ICD-10-CM | POA: Insufficient documentation

## 2021-08-22 DIAGNOSIS — R531 Weakness: Secondary | ICD-10-CM | POA: Insufficient documentation

## 2021-08-22 LAB — CBC
HCT: 37.5 % — ABNORMAL LOW (ref 39.0–52.0)
Hemoglobin: 12.7 g/dL — ABNORMAL LOW (ref 13.0–17.0)
MCH: 34 pg (ref 26.0–34.0)
MCHC: 33.9 g/dL (ref 30.0–36.0)
MCV: 100.5 fL — ABNORMAL HIGH (ref 80.0–100.0)
Platelets: 190 10*3/uL (ref 150–400)
RBC: 3.73 MIL/uL — ABNORMAL LOW (ref 4.22–5.81)
RDW: 13.2 % (ref 11.5–15.5)
WBC: 3.9 10*3/uL — ABNORMAL LOW (ref 4.0–10.5)
nRBC: 0 % (ref 0.0–0.2)

## 2021-08-22 LAB — ETHANOL: Alcohol, Ethyl (B): <10 mg/dL (ref <10)

## 2021-08-22 LAB — URINALYSIS, ROUTINE W REFLEX MICROSCOPIC
Bilirubin Urine: NEGATIVE
Glucose, UA: NEGATIVE mg/dL
Hgb urine dipstick: NEGATIVE
Ketones, ur: NEGATIVE mg/dL
Leukocytes,Ua: NEGATIVE
Nitrite: NEGATIVE
Protein, ur: NEGATIVE mg/dL
Specific Gravity, Urine: 1.017 (ref 1.005–1.030)
pH: 6 (ref 5.0–8.0)

## 2021-08-22 LAB — BASIC METABOLIC PANEL
Anion gap: 8 (ref 5–15)
BUN: 32 mg/dL — ABNORMAL HIGH (ref 8–23)
CO2: 26 mmol/L (ref 22–32)
Calcium: 8.8 mg/dL — ABNORMAL LOW (ref 8.9–10.3)
Chloride: 103 mmol/L (ref 98–111)
Creatinine, Ser: 1.14 mg/dL (ref 0.61–1.24)
GFR, Estimated: >60 mL/min (ref >60)
Glucose, Bld: 115 mg/dL — ABNORMAL HIGH (ref 70–99)
Potassium: 4.4 mmol/L (ref 3.5–5.1)
Sodium: 137 mmol/L (ref 135–145)

## 2021-08-22 LAB — I-STAT VENOUS BLOOD GAS, ED
Acid-Base Excess: 5 mmol/L — ABNORMAL HIGH (ref 0.0–2.0)
Bicarbonate: 29.7 mmol/L — ABNORMAL HIGH (ref 20.0–28.0)
Calcium, Ion: 1.09 mmol/L — ABNORMAL LOW (ref 1.15–1.40)
HCT: 43 % (ref 39.0–52.0)
Hemoglobin: 14.6 g/dL (ref 13.0–17.0)
O2 Saturation: 47 %
Potassium: 4.2 mmol/L (ref 3.5–5.1)
Sodium: 137 mmol/L (ref 135–145)
TCO2: 31 mmol/L (ref 22–32)
pCO2, Ven: 43.4 mmHg — ABNORMAL LOW (ref 44–60)
pH, Ven: 7.444 — ABNORMAL HIGH (ref 7.25–7.43)
pO2, Ven: 25 mmHg — CL (ref 32–45)

## 2021-08-22 LAB — HEPATIC FUNCTION PANEL
ALT: 29 U/L (ref 0–44)
AST: 31 U/L (ref 15–41)
Albumin: 4 g/dL (ref 3.5–5.0)
Alkaline Phosphatase: 59 U/L (ref 38–126)
Bilirubin, Direct: 0.2 mg/dL (ref 0.0–0.2)
Indirect Bilirubin: 0.5 mg/dL (ref 0.3–0.9)
Total Bilirubin: 0.7 mg/dL (ref 0.3–1.2)
Total Protein: 6.7 g/dL (ref 6.5–8.1)

## 2021-08-22 LAB — RESP PANEL BY RT-PCR (FLU A&B, COVID) ARPGX2
Influenza A by PCR: NEGATIVE
Influenza B by PCR: NEGATIVE
SARS Coronavirus 2 by RT PCR: NEGATIVE

## 2021-08-22 LAB — AMMONIA: Ammonia: 11 umol/L (ref 9–35)

## 2021-08-22 LAB — BRAIN NATRIURETIC PEPTIDE: B Natriuretic Peptide: 139.4 pg/mL — ABNORMAL HIGH (ref 0.0–100.0)

## 2021-08-22 LAB — LIPASE, BLOOD: Lipase: 35 U/L (ref 11–51)

## 2021-08-22 LAB — TROPONIN I (HIGH SENSITIVITY)
Troponin I (High Sensitivity): 11 ng/L (ref <18)
Troponin I (High Sensitivity): 11 ng/L (ref <18)

## 2021-08-22 MED ORDER — HYDROCODONE-ACETAMINOPHEN 5-325 MG PO TABS
1.0000 | ORAL_TABLET | Freq: Three times a day (TID) | ORAL | 0 refills | Status: DC | PRN
Start: 2021-08-22 — End: 2021-08-28

## 2021-08-22 MED ORDER — POLYETHYLENE GLYCOL 3350 17 GM/SCOOP PO POWD: 17.0000 g | Freq: Every day | ORAL | 0 refills | Status: AC

## 2021-08-22 MED ORDER — IOHEXOL 350 MG/ML SOLN
80.0000 mL | Freq: Once | INTRAVENOUS | Status: AC | PRN
  Administered 2021-08-22: 80 mL via INTRAVENOUS

## 2021-08-22 MED ORDER — SODIUM CHLORIDE 0.9 % IV BOLUS
500.0000 mL | Freq: Once | INTRAVENOUS | Status: AC
  Administered 2021-08-22: 500 mL via INTRAVENOUS

## 2021-08-22 MED ORDER — ACETAMINOPHEN 500 MG PO TABS: 500.0000 mg | ORAL_TABLET | Freq: Four times a day (QID) | ORAL | 0 refills | Status: DC | PRN

## 2021-08-22 NOTE — ED Notes (Signed)
Patient also refused CBG at this time  ?

## 2021-08-22 NOTE — ED Triage Notes (Signed)
[place] and stood to get out of the car and fell. Pts right shoulder is now red and swollen and he endorses shoulder pain. Pt was recently admitted for generalized weakness and discharged from [facility].  ?

## 2021-08-22 NOTE — Discharge Instructions (Addendum)
It was a pleasure caring for you today in the emergency department.  Please return to the emergency department for any worsening or worrisome symptoms.   Return to the Emergency Department if you have unusual chest pain, pressure, or discomfort, shortness of breath, nausea, vomiting, burping, heartburn, tingling upper body parts, sweating, cold, clammy skin, or racing heartbeat. Call 911 if you think you are having a heart attack. Take all cardiac medications as prescribed - notify your doctor if you have any side effects. Follow cardiac diet - avoid fatty & fried foods, don't eat too much red meat, eat lots of fruits & vegetables, and dairy products should be low fat. Please lose weight if you are overweight. Become more active with walking, gardening, or any other activity that gets you to moving.   Please return to the emergency department immediately for any new or concerning symptoms, or if you get worse.  

## 2021-08-22 NOTE — ED Notes (Signed)
Patient went home with son. Shoulder immobilizer applied.  ?

## 2021-08-22 NOTE — Discharge Instructions (Addendum)
You are seen in the ER for your fall.  Unfortunately, it has resulted in shoulder fracture. ? ?Treatment will be with ice and shoulder immobilization.  Please call the number provided for follow-up appointment. ? ?Take Tylenol 100 mg every 6 hours for pain control.  If the pain is unbearable despite taking Tylenol, you may take the stronger narcotic medicine.  Do realize please, that the narcotic medicine will make you drowsy and increase the chance of falls. ?

## 2021-08-22 NOTE — ED Notes (Signed)
Pt requested help to stand while urinating x3, within the last hour. While assisting, pt began to become unsteady. This technician offered both the Pt and the son the option of a male [place], in case a fall occurs or staff would not be able to make it in time. Both against the idea.  ?

## 2021-08-22 NOTE — ED Notes (Signed)
Water provided to patient

## 2021-08-22 NOTE — ED Provider Notes (Signed)
?[facility] DEPT ?Provider Note ? ? ?CSN: [id number] ?Arrival date & time: 08/22/21  David Scott ? ?  ? ?History ? ?Chief Complaint  ?Patient presents with  ? Fall  ? ? ?David Scott is a 81 y.o. male. ? ?HPI ? ?  ? ?81 year old male comes in with chief complaint of fall. ? ?Patient here with his son.  Patient was trying to get out of his car, and fell.  He is complaining primarily of right shoulder pain.  Son thinks that the fall was because of generalized weakness and lack of sleep, as patient was in the ER for 16 hours yesterday.  ? ?Patient denies being on blood thinners.  He has not ambulated after the fall. ? ?Home Medications ?Prior to Admission medications   ?Medication Sig Start Date End Date Taking? Authorizing Provider  ?acetaminophen (TYLENOL) 500 MG tablet Take 1 tablet (500 mg total) by mouth every 6 (six) hours as needed. 08/22/21  Yes Varney Biles, MD  ?HYDROcodone-acetaminophen (NORCO/VICODIN) 5-325 MG tablet Take 1 tablet by mouth every 8 (eight) hours as needed. 08/22/21  Yes Varney Biles, MD  ?albuterol (PROVENTIL HFA;VENTOLIN HFA) 108 (90 Base) MCG/ACT inhaler Inhale 2 puffs into the lungs every 6 (six) hours as needed for wheezing or shortness of breath.  09/09/17   [provider]  ?aspirin 81 MG tablet Take 81 mg by mouth daily after lunch.     [provider]  ?Cholecalciferol (VITAMIN D-3 PO) Take 1 capsule by mouth daily after lunch.     [provider]  ?furosemide (LASIX) 20 MG tablet Take 1/2 tablet daily OK to take an extra 1/2 tablet as needed for swelling or shortness of breath 03/03/21   Skeet Latch, MD  ?Magnesium 250 MG TABS Take 250 mg by mouth daily.    [provider]  ?Multiple Vitamin (MULTIVITAMIN) tablet Take 1 tablet by mouth daily.    [provider]  ?polyethylene glycol powder (GLYCOLAX/MIRALAX) 17 GM/SCOOP powder Take 17 g by mouth daily for 7 doses. 08/22/21 08/29/21  Jeanell Sparrow, DO   ?vitamin C (ASCORBIC ACID) 500 MG tablet Take 1,000 mg by mouth daily.    [provider]  ?   ? ?Allergies    ?Benzonatate, Tamsulosin hcl, and Septra [sulfamethoxazole-trimethoprim]   ? ?Review of Systems   ?Review of Systems  ?All other systems reviewed and are negative. ? ?Physical Exam ?Updated Vital Signs ?BP (!) 180/78   Pulse 81   Temp 98.4 ?F (36.9 ?C) (Oral)   Resp 17   SpO2 97%  ?Physical Exam ?Vitals and nursing note reviewed.  ?Constitutional:   ?   Appearance: He is well-developed.  ?HENT:  ?   Head: Atraumatic.  ?Neck:  ?   Comments: Lower cervical spine tenderness ?Cardiovascular:  ?   Rate and Rhythm: Normal rate.  ?Pulmonary:  ?   Effort: Pulmonary effort is normal.  ?Abdominal:  ?   Tenderness: There is no abdominal tenderness.  ?Musculoskeletal:  ?   Comments: Patient has tenderness over the right shoulder, bilateral pelvis region. ?  ?Skin: ?   General: Skin is warm.  ?Neurological:  ?   Mental Status: He is alert and oriented to person, place, and time.  ? ? ?ED Results / Procedures / Treatments   ?Labs ?(all labs ordered are listed, but only abnormal results are displayed) ?Labs Reviewed  ?TYPE AND SCREEN  ? ? ?EKG ?None ? ?Radiology ? ?Procedures ?Procedures  ? ? ?  Medications Ordered in ED ?Medications - No data to display ? ?ED Course/ Medical Decision Making/ A&P ?  ?                        ?Medical Decision Making ?Amount and/or Complexity of Data Reviewed ?Labs: ordered. ?Radiology: ordered. ? ?Risk ?OTC drugs. ?Prescription drug management. ? ? ?This patient presents to the ED with chief complaint(s) of fall and right-sided shoulder pain with pertinent past medical history of generalized weakness, stroke. ? ?The differential diagnosis includes : ?DDx includes: ?- Mechanical falls ?- ICH ?- Fractures ?- Contusions ?- Soft tissue injury ?  ?The initial plan is to given patient's age, and distracting injury over the right shoulder, CT scan of the brain, C-spine ordered to  ensure there is no clinically significant brain injury or cervical spine injury.  Patient will also require x-ray of the chest, x-ray of the shoulder and x-ray of the pelvis.  He has declined pain medicine for now. ? ?Additional history obtained: ?Additional history obtained from spouse ?Records reviewed  -reviewed results from yesterday.  Specifically, the blood work looked reassuring. ? ? ?Independent visualization of imaging: ?- Imaging that was independently visualized with scope of interpretation limited to determining acute life threatening conditions related to emergency care: X-ray of the chest, x-ray of the shoulder and CT scan of the brain, which revealed nondisplaced proximal humeral fracture, negative pneumothorax and no intracranial hemorrhage ? ?Treatment and Reassessment: ?Results discussed with the patient.  He will be placed in a sling.  Outpatient follow-up instructions provided.  Discussed pain management as well. ?Final Clinical Impression(s) / ED Diagnoses ?Final diagnoses:  ?Other closed nondisplaced fracture of proximal end of right humerus, initial encounter  ? ? ?Rx / DC Orders ?ED Discharge Orders   ? ?      Ordered  ?  HYDROcodone-acetaminophen (NORCO/VICODIN) 5-325 MG tablet  Every 8 hours PRN       ? 08/22/21 2016  ?  acetaminophen (TYLENOL) 500 MG tablet  Every 6 hours PRN       ? 08/22/21 2016  ? ?  ?  ? ?  ? ? ?  ?Varney Biles, MD ?08/22/21 2017 ? ?

## 2021-08-22 NOTE — ED Provider Notes (Signed)
?[facility] ?Provider Note ? ? ?CSN: [id number] ?Arrival date & time: 08/21/21  2321 ? ?  ? ?History ? ?Chief Complaint  ?Patient presents with  ? Constipation  ? Chest Pain  ? ? ?David Scott is a 81 y.o. male. ? ? Patient as above with significant medical history as below, including CVA with left-sided deficit, bradycardia who presents to the ED with complaint of constipation, fatigue, feeling unwell. ? ?Patient ports poor appetite last 3 days, says has not had a good bowel movement in 3 days.  He is having some liquid stool, increased gassiness.  Vague abdominal discomfort, vague chest discomfort.  No acute pain at this time.  Discussed with son at bedside, he takes albuterol and Lasix and a myriad of vitamin supplements.  Unchanged home supplement regimen.  No recent travel or sick contacts, fevers or chills.  No recent alcohol use or illicit substance use. ? ?He feels like he had some chest pain earlier yesterday but has since subsided.  No current discomfort in his chest or dyspnea. ? ? ? ? ? ?Past Medical History: ?06/11/2021: Aortic atherosclerosis ([place]) ?06/11/2021: Bradycardia ?No date: CVA (cerebral vascular accident) ([place]) ?No date: Esophageal reflux ?No date: Leg fracture ?03/03/2021: Shortness of breath ?03/03/2021: Skin lesion ? ?History reviewed. No pertinent surgical history.  ? ? ?The history is provided by the patient and a relative. No language interpreter was used.  ?Constipation ?Associated symptoms: abdominal pain   ?Associated symptoms: no fever, no nausea and no vomiting   ?Chest Pain ?Associated symptoms: abdominal pain and fatigue   ?Associated symptoms: no cough, no dysphagia, no fever, no headache, no nausea, no palpitations, no shortness of breath and no vomiting   ? ?  ? ?Home Medications ?Prior to Admission medications   ?Medication Sig Start Date End Date Taking? Authorizing Provider  ?albuterol (PROVENTIL HFA;VENTOLIN HFA) 108 (90 Base) MCG/ACT  inhaler Inhale 2 puffs into the lungs every 6 (six) hours as needed for wheezing or shortness of breath.  09/09/17  Yes [provider]  ?aspirin 81 MG tablet Take 81 mg by mouth daily after lunch.    Yes [provider]  ?Cholecalciferol (VITAMIN D-3 PO) Take 1 capsule by mouth daily after lunch.    Yes [provider]  ?furosemide (LASIX) 20 MG tablet Take 1/2 tablet daily OK to take an extra 1/2 tablet as needed for swelling or shortness of breath 03/03/21  Yes Skeet Latch, MD  ?Magnesium 250 MG TABS Take 250 mg by mouth daily.   Yes [provider]  ?Multiple Vitamin (MULTIVITAMIN) tablet Take 1 tablet by mouth daily.   Yes [provider]  ?polyethylene glycol powder (GLYCOLAX/MIRALAX) 17 GM/SCOOP powder Take 17 g by mouth daily for 7 doses. 08/22/21 08/29/21 Yes Jeanell Sparrow, DO  ?vitamin C (ASCORBIC ACID) 500 MG tablet Take 1,000 mg by mouth daily.   Yes [provider]  ?   ? ?Allergies    ?Benzonatate, Tamsulosin hcl, and Septra [sulfamethoxazole-trimethoprim]   ? ?Review of Systems   ?Review of Systems  ?Constitutional:  Positive for appetite change and fatigue. Negative for chills and fever.  ?HENT:  Negative for facial swelling and trouble swallowing.   ?Eyes:  Negative for photophobia and visual disturbance.  ?Respiratory:  Negative for cough and shortness of breath.   ?Cardiovascular:  Positive for chest pain. Negative for palpitations.  ?Gastrointestinal:  Positive for abdominal pain and constipation. Negative for nausea and vomiting.  ?  Endocrine: Negative for polydipsia and polyuria.  ?Genitourinary:  Negative for difficulty urinating and hematuria.  ?Musculoskeletal:  Negative for gait problem and joint swelling.  ?Skin:  Negative for pallor and rash.  ?Neurological:  Negative for syncope and headaches.  ?Psychiatric/Behavioral:  Negative for agitation and confusion.   ? ?Physical Exam ?Updated Vital Signs ?BP (!) 171/72   Pulse (!) 53    Temp 98.8 ?F (37.1 ?C) (Oral)   Resp 16   SpO2 99%  ?Physical Exam ?Vitals and nursing note reviewed.  ?Constitutional:   ?   General: He is not in acute distress. ?   Appearance: He is well-developed.  ?   Comments: frail  ?HENT:  ?   Head: Normocephalic and atraumatic.  ?   Right Ear: External ear normal.  ?   Left Ear: External ear normal.  ?   Mouth/Throat:  ?   Mouth: Mucous membranes are moist.  ?Eyes:  ?   General: No scleral icterus. ?Cardiovascular:  ?   Rate and Rhythm: Normal rate and regular rhythm.  ?   Pulses: Normal pulses.  ?   Heart sounds: Normal heart sounds.  ?Pulmonary:  ?   Effort: Pulmonary effort is normal. No respiratory distress.  ?   Breath sounds: Normal breath sounds.  ?Abdominal:  ?   General: Abdomen is flat.  ?   Palpations: Abdomen is soft.  ?   Tenderness: There is no abdominal tenderness.  ?   Hernia: A hernia is present.  ? ? ?Musculoskeletal:     ?   General: Normal range of motion.  ?   Cervical back: Normal range of motion.  ?   Right lower leg: No edema.  ?   Left lower leg: No edema.  ?Skin: ?   General: Skin is warm and dry.  ?   Capillary Refill: Capillary refill takes less than 2 seconds.  ?Neurological:  ?   Mental Status: He is alert and oriented to person, place, and time. Mental status is at baseline.  ?   GCS: GCS eye subscore is 4. GCS verbal subscore is 5. GCS motor subscore is 6.  ?   Cranial Nerves: Cranial nerves 2-12 are intact.  ?   Sensory: Sensation is intact.  ?   Motor: Tremor present.  ?   Comments: LUE weakness from prior CVA ?Approximate baseline currently per son at bedside  ?Psychiatric:     ?   Mood and Affect: Mood normal.     ?   Behavior: Behavior normal.  ? ? ?ED Results / Procedures / Treatments   ?Labs ?(all labs ordered are listed, but only abnormal results are displayed) ?Labs Reviewed  ?BASIC METABOLIC PANEL - Abnormal; Notable for the following components:  ?    Result Value  ? Glucose, Bld 115 (*)   ? BUN 32 (*)   ? Calcium 8.8 (*)   ?  All other components within normal limits  ?CBC - Abnormal; Notable for the following components:  ? WBC 3.9 (*)   ? RBC 3.73 (*)   ? Hemoglobin 12.7 (*)   ? HCT 37.5 (*)   ? MCV 100.5 (*)   ? All other components within normal limits  ?BRAIN NATRIURETIC PEPTIDE - Abnormal; Notable for the following components:  ? B Natriuretic Peptide 139.4 (*)   ? All other components within normal limits  ?I-STAT VENOUS BLOOD GAS, ED - Abnormal; Notable for the following components:  ? pH, Ven 7.444 (*)   ?  pCO2, Ven 43.4 (*)   ? pO2, Ven 25 (*)   ? Bicarbonate 29.7 (*)   ? Acid-Base Excess 5.0 (*)   ? Calcium, Ion 1.09 (*)   ? All other components within normal limits  ?RESP PANEL BY RT-PCR (FLU A&B, COVID) ARPGX2  ?ETHANOL  ?AMMONIA  ?HEPATIC FUNCTION PANEL  ?LIPASE, BLOOD  ?URINALYSIS, ROUTINE W REFLEX MICROSCOPIC  ?BLOOD GAS, VENOUS  ?TROPONIN I (HIGH SENSITIVITY)  ?TROPONIN I (HIGH SENSITIVITY)  ? ? ?EKG ?EKG Interpretation ? ?Date/Time:  Friday August 21 2021 23:58:54 EDT ?Ventricular Rate:  50 ?PR Interval:  174 ?QRS Duration: 90 ?QT Interval:  426 ?QTC Calculation: 388 ?R Axis:   256 ?Text Interpretation: Sinus bradycardia Right superior axis deviation Anterior infarct , age undetermined Abnormal ECG When compared with ECG of 05-Apr-2021 09:46, PREVIOUS ECG IS PRESENT since last tracing no significant change Confirmed by Malvin Johns [phone]) on 08/22/2021 11:13:14 AM ? ?Radiology ?DG Chest 2 View ? ?Result Date: 08/22/2021 ?CLINICAL DATA:  Chest pain. EXAM: CHEST - 2 VIEW COMPARISON:  04/05/2021. FINDINGS: The heart size and mediastinal contours are stable. There is atherosclerotic calcification of the aorta. Hyperinflation of the lungs is noted. No consolidation, effusion, or pneumothorax. There is stable deformity of the sternum. No acute osseous abnormality. IMPRESSION: Hyperinflation of the lungs with no acute cardiopulmonary process. Electronically Signed   By: Brett Fairy M.D.   On: 08/22/2021 00:35  ? ?CT Head Wo  Contrast ? ?Result Date: 08/22/2021 ?CLINICAL DATA:  Delirium. EXAM: CT HEAD WITHOUT CONTRAST TECHNIQUE: Contiguous axial images were obtained from the base of the skull through the vertex without intravenous c

## 2021-08-22 NOTE — ED Notes (Signed)
Patient is refusing bloodwork at this time. MD is aware.  ?

## 2021-08-23 ENCOUNTER — Encounter (HOSPITAL_COMMUNITY): Payer: Self-pay

## 2021-08-23 ENCOUNTER — Inpatient Hospital Stay (HOSPITAL_COMMUNITY): Payer: Medicare Other

## 2021-08-23 ENCOUNTER — Emergency Department (HOSPITAL_COMMUNITY): Payer: Medicare Other

## 2021-08-23 ENCOUNTER — Other Ambulatory Visit: Payer: Self-pay

## 2021-08-23 ENCOUNTER — Inpatient Hospital Stay (HOSPITAL_COMMUNITY)
Admission: EM | Admit: 2021-08-23 | Discharge: 2021-08-28 | DRG: 562 | Disposition: A | Discharge status: Skilled Nursing Facility | Payer: Medicare Other | Attending: Internal Medicine | Admitting: Internal Medicine

## 2021-08-23 DIAGNOSIS — S42209A Unspecified fracture of upper end of unspecified humerus, initial encounter for closed fracture: Secondary | ICD-10-CM | POA: Diagnosis present

## 2021-08-23 DIAGNOSIS — R278 Other lack of coordination: Secondary | ICD-10-CM | POA: Diagnosis present

## 2021-08-23 DIAGNOSIS — R627 Adult failure to thrive: Secondary | ICD-10-CM | POA: Diagnosis present

## 2021-08-23 DIAGNOSIS — R296 Repeated falls: Secondary | ICD-10-CM | POA: Diagnosis present

## 2021-08-23 DIAGNOSIS — Z20822 Contact with and (suspected) exposure to covid-19: Secondary | ICD-10-CM | POA: Diagnosis present

## 2021-08-23 DIAGNOSIS — N289 Disorder of kidney and ureter, unspecified: Secondary | ICD-10-CM | POA: Diagnosis not present

## 2021-08-23 DIAGNOSIS — S42294A Other nondisplaced fracture of upper end of right humerus, initial encounter for closed fracture: Secondary | ICD-10-CM | POA: Diagnosis not present

## 2021-08-23 DIAGNOSIS — K5730 Diverticulosis of large intestine without perforation or abscess without bleeding: Secondary | ICD-10-CM | POA: Diagnosis present

## 2021-08-23 DIAGNOSIS — E860 Dehydration: Secondary | ICD-10-CM | POA: Diagnosis present

## 2021-08-23 DIAGNOSIS — Z888 Allergy status to other drugs, medicaments and biological substances status: Secondary | ICD-10-CM

## 2021-08-23 DIAGNOSIS — N179 Acute kidney failure, unspecified: Secondary | ICD-10-CM | POA: Diagnosis present

## 2021-08-23 DIAGNOSIS — M6259 Muscle wasting and atrophy, not elsewhere classified, multiple sites: Secondary | ICD-10-CM | POA: Diagnosis not present

## 2021-08-23 DIAGNOSIS — I6930 Unspecified sequelae of cerebral infarction: Secondary | ICD-10-CM | POA: Diagnosis not present

## 2021-08-23 DIAGNOSIS — R4182 Altered mental status, unspecified: Principal | ICD-10-CM

## 2021-08-23 DIAGNOSIS — S42214A Unspecified nondisplaced fracture of surgical neck of right humerus, initial encounter for closed fracture: Secondary | ICD-10-CM | POA: Diagnosis present

## 2021-08-23 DIAGNOSIS — R262 Difficulty in walking, not elsewhere classified: Secondary | ICD-10-CM | POA: Diagnosis not present

## 2021-08-23 DIAGNOSIS — M2550 Pain in unspecified joint: Secondary | ICD-10-CM | POA: Diagnosis not present

## 2021-08-23 DIAGNOSIS — R2689 Other abnormalities of gait and mobility: Secondary | ICD-10-CM | POA: Diagnosis not present

## 2021-08-23 DIAGNOSIS — G9341 Metabolic encephalopathy: Secondary | ICD-10-CM | POA: Diagnosis present

## 2021-08-23 DIAGNOSIS — D539 Nutritional anemia, unspecified: Secondary | ICD-10-CM | POA: Diagnosis present

## 2021-08-23 DIAGNOSIS — Z66 Do not resuscitate: Secondary | ICD-10-CM | POA: Diagnosis present

## 2021-08-23 DIAGNOSIS — R410 Disorientation, unspecified: Secondary | ICD-10-CM | POA: Diagnosis not present

## 2021-08-23 DIAGNOSIS — R531 Weakness: Secondary | ICD-10-CM | POA: Diagnosis not present

## 2021-08-23 DIAGNOSIS — I700 Atherosclerosis of aorta: Secondary | ICD-10-CM | POA: Diagnosis present

## 2021-08-23 DIAGNOSIS — R079 Chest pain, unspecified: Secondary | ICD-10-CM | POA: Diagnosis present

## 2021-08-23 DIAGNOSIS — M6281 Muscle weakness (generalized): Secondary | ICD-10-CM | POA: Diagnosis not present

## 2021-08-23 DIAGNOSIS — R54 Age-related physical debility: Secondary | ICD-10-CM | POA: Diagnosis present

## 2021-08-23 DIAGNOSIS — S42294D Other nondisplaced fracture of upper end of right humerus, subsequent encounter for fracture with routine healing: Secondary | ICD-10-CM | POA: Diagnosis not present

## 2021-08-23 DIAGNOSIS — E46 Unspecified protein-calorie malnutrition: Secondary | ICD-10-CM | POA: Diagnosis not present

## 2021-08-23 DIAGNOSIS — L8990 Pressure ulcer of unspecified site, unspecified stage: Secondary | ICD-10-CM | POA: Diagnosis present

## 2021-08-23 DIAGNOSIS — Z79899 Other long term (current) drug therapy: Secondary | ICD-10-CM

## 2021-08-23 DIAGNOSIS — I69828 Other speech and language deficits following other cerebrovascular disease: Secondary | ICD-10-CM | POA: Diagnosis not present

## 2021-08-23 DIAGNOSIS — I959 Hypotension, unspecified: Secondary | ICD-10-CM | POA: Diagnosis not present

## 2021-08-23 DIAGNOSIS — G253 Myoclonus: Secondary | ICD-10-CM | POA: Diagnosis not present

## 2021-08-23 DIAGNOSIS — Z043 Encounter for examination and observation following other accident: Secondary | ICD-10-CM | POA: Diagnosis not present

## 2021-08-23 DIAGNOSIS — Z882 Allergy status to sulfonamides status: Secondary | ICD-10-CM

## 2021-08-23 DIAGNOSIS — K4090 Unilateral inguinal hernia, without obstruction or gangrene, not specified as recurrent: Secondary | ICD-10-CM | POA: Diagnosis present

## 2021-08-23 DIAGNOSIS — Z681 Body mass index (BMI) 19 or less, adult: Secondary | ICD-10-CM

## 2021-08-23 DIAGNOSIS — M5030 Other cervical disc degeneration, unspecified cervical region: Secondary | ICD-10-CM | POA: Diagnosis not present

## 2021-08-23 DIAGNOSIS — R64 Cachexia: Secondary | ICD-10-CM | POA: Diagnosis present

## 2021-08-23 DIAGNOSIS — S42211A Unspecified displaced fracture of surgical neck of right humerus, initial encounter for closed fracture: Secondary | ICD-10-CM | POA: Diagnosis not present

## 2021-08-23 DIAGNOSIS — S42201D Unspecified fracture of upper end of right humerus, subsequent encounter for fracture with routine healing: Secondary | ICD-10-CM | POA: Diagnosis not present

## 2021-08-23 DIAGNOSIS — F05 Delirium due to known physiological condition: Secondary | ICD-10-CM | POA: Diagnosis not present

## 2021-08-23 DIAGNOSIS — Z7401 Bed confinement status: Secondary | ICD-10-CM | POA: Diagnosis not present

## 2021-08-23 DIAGNOSIS — Z7982 Long term (current) use of aspirin: Secondary | ICD-10-CM

## 2021-08-23 DIAGNOSIS — K5900 Constipation, unspecified: Secondary | ICD-10-CM | POA: Diagnosis present

## 2021-08-23 DIAGNOSIS — R1312 Dysphagia, oropharyngeal phase: Secondary | ICD-10-CM | POA: Diagnosis not present

## 2021-08-23 DIAGNOSIS — L89152 Pressure ulcer of sacral region, stage 2: Secondary | ICD-10-CM | POA: Diagnosis present

## 2021-08-23 DIAGNOSIS — Z7189 Other specified counseling: Secondary | ICD-10-CM | POA: Diagnosis not present

## 2021-08-23 DIAGNOSIS — W1789XA Other fall from one level to another, initial encounter: Secondary | ICD-10-CM | POA: Diagnosis present

## 2021-08-23 DIAGNOSIS — I69354 Hemiplegia and hemiparesis following cerebral infarction affecting left non-dominant side: Secondary | ICD-10-CM | POA: Diagnosis not present

## 2021-08-23 DIAGNOSIS — E43 Unspecified severe protein-calorie malnutrition: Secondary | ICD-10-CM | POA: Diagnosis present

## 2021-08-23 DIAGNOSIS — K219 Gastro-esophageal reflux disease without esophagitis: Secondary | ICD-10-CM | POA: Diagnosis present

## 2021-08-23 DIAGNOSIS — Z515 Encounter for palliative care: Secondary | ICD-10-CM | POA: Diagnosis not present

## 2021-08-23 DIAGNOSIS — R41841 Cognitive communication deficit: Secondary | ICD-10-CM | POA: Diagnosis not present

## 2021-08-23 DIAGNOSIS — R0602 Shortness of breath: Secondary | ICD-10-CM | POA: Diagnosis not present

## 2021-08-23 DIAGNOSIS — W19XXXA Unspecified fall, initial encounter: Secondary | ICD-10-CM | POA: Diagnosis not present

## 2021-08-23 DIAGNOSIS — I69351 Hemiplegia and hemiparesis following cerebral infarction affecting right dominant side: Secondary | ICD-10-CM | POA: Diagnosis not present

## 2021-08-23 LAB — URINALYSIS, ROUTINE W REFLEX MICROSCOPIC
Bilirubin Urine: NEGATIVE
Glucose, UA: NEGATIVE mg/dL
Hgb urine dipstick: NEGATIVE
Ketones, ur: 20 mg/dL — AB
Leukocytes,Ua: NEGATIVE
Nitrite: NEGATIVE
Protein, ur: NEGATIVE mg/dL
Specific Gravity, Urine: 1.032 — ABNORMAL HIGH (ref 1.005–1.030)
pH: 5 (ref 5.0–8.0)

## 2021-08-23 LAB — CBC WITH DIFFERENTIAL/PLATELET
Abs Immature Granulocytes: 0.05 10*3/uL (ref 0.00–0.07)
Basophils Absolute: 0 10*3/uL (ref 0.0–0.1)
Basophils Relative: 0 %
Eosinophils Absolute: 0 10*3/uL (ref 0.0–0.5)
Eosinophils Relative: 0 %
HCT: 36.1 % — ABNORMAL LOW (ref 39.0–52.0)
Hemoglobin: 11.9 g/dL — ABNORMAL LOW (ref 13.0–17.0)
Immature Granulocytes: 1 %
Lymphocytes Relative: 4 %
Lymphs Abs: 0.4 10*3/uL — ABNORMAL LOW (ref 0.7–4.0)
MCH: 33.6 pg (ref 26.0–34.0)
MCHC: 33 g/dL (ref 30.0–36.0)
MCV: 102 fL — ABNORMAL HIGH (ref 80.0–100.0)
Monocytes Absolute: 0.7 10*3/uL (ref 0.1–1.0)
Monocytes Relative: 6 %
Neutro Abs: 9.4 10*3/uL — ABNORMAL HIGH (ref 1.7–7.7)
Neutrophils Relative %: 89 %
Platelets: 190 10*3/uL (ref 150–400)
RBC: 3.54 MIL/uL — ABNORMAL LOW (ref 4.22–5.81)
RDW: 13.1 % (ref 11.5–15.5)
WBC: 10.6 10*3/uL — ABNORMAL HIGH (ref 4.0–10.5)
nRBC: 0 % (ref 0.0–0.2)

## 2021-08-23 LAB — BLOOD GAS, VENOUS
Acid-Base Excess: 0 mmol/L (ref 0.0–2.0)
Bicarbonate: 24.8 mmol/L (ref 20.0–28.0)
O2 Saturation: 66.4 %
Patient temperature: 37
pCO2, Ven: 40 mmHg — ABNORMAL LOW (ref 44–60)
pH, Ven: 7.4 (ref 7.25–7.43)
pO2, Ven: 39 mmHg (ref 32–45)

## 2021-08-23 LAB — COMPREHENSIVE METABOLIC PANEL
ALT: 32 U/L (ref 0–44)
AST: 35 U/L (ref 15–41)
Albumin: 3.7 g/dL (ref 3.5–5.0)
Alkaline Phosphatase: 63 U/L (ref 38–126)
Anion gap: 9 (ref 5–15)
BUN: 35 mg/dL — ABNORMAL HIGH (ref 8–23)
CO2: 24 mmol/L (ref 22–32)
Calcium: 8.8 mg/dL — ABNORMAL LOW (ref 8.9–10.3)
Chloride: 104 mmol/L (ref 98–111)
Creatinine, Ser: 1.29 mg/dL — ABNORMAL HIGH (ref 0.61–1.24)
GFR, Estimated: 57 mL/min — ABNORMAL LOW (ref >60)
Glucose, Bld: 145 mg/dL — ABNORMAL HIGH (ref 70–99)
Potassium: 4.9 mmol/L (ref 3.5–5.1)
Sodium: 137 mmol/L (ref 135–145)
Total Bilirubin: 1.1 mg/dL (ref 0.3–1.2)
Total Protein: 6 g/dL — ABNORMAL LOW (ref 6.5–8.1)

## 2021-08-23 LAB — CK: Total CK: 750 U/L — ABNORMAL HIGH (ref 49–397)

## 2021-08-23 LAB — PROTIME-INR
INR: 1.2 (ref 0.8–1.2)
Prothrombin Time: 14.6 seconds (ref 11.4–15.2)

## 2021-08-23 LAB — AMMONIA: Ammonia: <10 umol/L (ref 9–35)

## 2021-08-23 LAB — MAGNESIUM: Magnesium: 2.3 mg/dL (ref 1.7–2.4)

## 2021-08-23 MED ORDER — POLYVINYL ALCOHOL 1.4 % OP SOLN
Freq: Every day | OPHTHALMIC | Status: DC
Start: 2021-08-23 — End: 2021-08-29
  Filled 2021-08-23: qty 15

## 2021-08-23 MED ORDER — RISAQUAD PO CAPS
1.0000 | ORAL_CAPSULE | Freq: Every day | ORAL | Status: DC
  Filled 2021-08-23 (×3): qty 1

## 2021-08-23 MED ORDER — MELATONIN 3 MG PO TABS
3.0000 mg | ORAL_TABLET | Freq: Every day | ORAL | Status: DC
  Filled 2021-08-23: qty 1

## 2021-08-23 MED ORDER — ENOXAPARIN SODIUM 40 MG/0.4ML IJ SOSY
40.0000 mg | PREFILLED_SYRINGE | INTRAMUSCULAR | Status: DC
  Administered 2021-08-23 – 2021-08-26 (×4): 40 mg via SUBCUTANEOUS
  Filled 2021-08-23 (×5): qty 0.4

## 2021-08-23 MED ORDER — HYDROCODONE-ACETAMINOPHEN 5-325 MG PO TABS: 1.0000 | ORAL_TABLET | Freq: Three times a day (TID) | ORAL | Status: DC | PRN

## 2021-08-23 MED ORDER — SODIUM CHLORIDE 0.9% FLUSH
3.0000 mL | Freq: Two times a day (BID) | INTRAVENOUS | Status: DC
  Administered 2021-08-24 – 2021-08-27 (×6): 3 mL via INTRAVENOUS

## 2021-08-23 MED ORDER — SODIUM CHLORIDE 0.9 % IV SOLN: INTRAVENOUS | Status: DC

## 2021-08-23 MED ORDER — ACETAMINOPHEN 325 MG PO TABS: 650.0000 mg | ORAL_TABLET | Freq: Four times a day (QID) | ORAL | Status: DC | PRN

## 2021-08-23 MED ORDER — ACETAMINOPHEN 650 MG RE SUPP: 650.0000 mg | Freq: Four times a day (QID) | RECTAL | Status: DC | PRN

## 2021-08-23 MED ORDER — ASPIRIN EC 81 MG PO TBEC
81.0000 mg | DELAYED_RELEASE_TABLET | Freq: Every day | ORAL | Status: DC
  Filled 2021-08-23 (×2): qty 1

## 2021-08-23 MED ORDER — ALBUTEROL SULFATE (2.5 MG/3ML) 0.083% IN NEBU: 2.5000 mg | INHALATION_SOLUTION | Freq: Four times a day (QID) | RESPIRATORY_TRACT | Status: DC | PRN

## 2021-08-23 MED ORDER — POLYETHYLENE GLYCOL 3350 17 G PO PACK: 17.0000 g | PACK | Freq: Every day | ORAL | Status: DC | PRN

## 2021-08-23 MED ORDER — PROBIOTIC ACIDOPHILUS BIOBEADS PO CAPS: 1.0000 | ORAL_CAPSULE | Freq: Every day | ORAL | Status: DC

## 2021-08-23 MED ORDER — LACTATED RINGERS IV BOLUS
1000.0000 mL | Freq: Once | INTRAVENOUS | Status: AC
  Administered 2021-08-23: 1000 mL via INTRAVENOUS

## 2021-08-23 NOTE — ED Triage Notes (Signed)
Pt arrived via [facility] from home. Per EMS, pt's son found pt on his buttocks on the floor and pt couldn't get up. Pt is AMS, son told EMS pt has been AMS recently, but couldn't say how long. Per EMS, pt's son is poor historian. Pt has shoulder sling on from fall yesterday on right shoulder. Pt has ecchymosis of right anterior and side of shoulder. Per son, pt was walking around yesterday, but unable to walk today. It's unknown if pt hit head or how long pt was down. Per EMS, they didn't find any blood thinners at the scene. Pt arrived w/c-collar on. Pt is A&Ox1 to self only. VSS  ?

## 2021-08-23 NOTE — ED Notes (Signed)
Patient transported to CT 

## 2021-08-23 NOTE — ED Notes (Signed)
MD at bedside. 

## 2021-08-23 NOTE — H&P (Addendum)
History and Physical    Patient: David Scott [id number] DOB: Sep 11, 1940 DOA: 08/23/2021 DOS: the patient was seen and examined on 08/23/2021 PCP: Elias Else, MD  Patient coming from:  via EMS  Chief Complaint:  Chief Complaint  Patient presents with   Altered Mental Status   HPI: David Scott is a 81 y.o. male with medical history significant of hypertension, COPD, CVA in 2018 with residual left-sided weakness, and bradycardia who presents after having a fall and noted to be more altered.  Son notes that they had been in the emergency department on 4/28 due to patient having complaints of constipation and generalized malaise.  CT scan of the abdomen and pelvis noted a right inguinal hernia without signs of incarceration and diverticulosis.  Patient's son notes that they spent over 16 hours in the ED when they finally able to be discharged.  He had stopped to get food and had left his father in the car.  However, his father tried to come in after him and had fallen.  His son did not realize that he had fallen until he was coming out of the restaurant.  He is noted to have a deformity of his right shoulder and was found to have an acute fracture once seen in the emergency department.  Plans were for him to follow-up with orthopedics in the outpatient setting.  However, this morning the patient's son found his father on the floor and was more confused.  At baseline patient takes small steps, but previously had been able to get around with needed for assistance usually just with stairs.  Patient has been being worked up in the outpatient setting by cardiology in regards to bradycardia recently Holter monitor which resulted on 4/23 noting average heart rate 56 bpm with minimum heart rate 35 bpm and max heart rate 139, 14 runs of SVT up to 19 beats, rare PACs and PVCs with ventricular bigeminy and trigeminy present.  Upon admission in the emergency department patient was seen to be afebrile  with pulse 74-1 09, respirations 16-28, blood pressures 114/81 to 186/73, and all other vital signs maintained.  Labs significant for WBC 10.6, hemoglobin 11.9, MCV 102, BUN 35, creatinine 1.29,  and CK 750.  Chest x-ray noted proximal right femoral fracture.  X-rays of the pelvis noted right inguinal hernia containing bowel.  Review of Systems: As mentioned in the history of present illness. All other systems reviewed and are negative. Past Medical History:  Diagnosis Date   Aortic atherosclerosis ([facility]) 06/11/2021   Bradycardia 06/11/2021   CVA (cerebral vascular accident) [facility])    Esophageal reflux    Leg fracture    Shortness of breath 03/03/2021   Skin lesion 03/03/2021   History reviewed. No pertinent surgical history. Social History:  reports that he has never smoked. He has never used smokeless tobacco. He reports that he does not drink alcohol and does not use drugs.  Allergies  Allergen Reactions   Benzonatate     Other reaction(s): vivid dreams   Tamsulosin Hcl     Other reaction(s): dizzy/achy   Septra [Sulfamethoxazole-Trimethoprim] Rash    Pt states MD had him taking Sulfa by the handful when he had prostate infection, says they don't know what caused the rash    Family History  Problem Relation Age of Onset   COPD Father     Prior to Admission medications   Medication Sig Start Date End Date Taking? Authorizing Provider  albuterol (PROVENTIL HFA;VENTOLIN HFA)  108 (90 Base) MCG/ACT inhaler Inhale 2 puffs into the lungs every 6 (six) hours as needed for wheezing or shortness of breath.  09/09/17  Yes [provider]  aspirin 81 MG tablet Take 81 mg by mouth daily after lunch.    Yes [provider]  Cholecalciferol (VITAMIN D-3 PO) Take 1 capsule by mouth daily after lunch.    Yes [provider]  furosemide (LASIX) 20 MG tablet Take 1/2 tablet daily OK to take an extra 1/2 tablet as needed for swelling or shortness of breath Patient taking  differently: Take 10 mg by mouth daily. Take an extra 1/2 tablet as needed for swelling or shortness of breath 03/03/21  Yes Chilton Si, MD  Lactobacillus (PROBIOTIC ACIDOPHILUS PO) Take 1 capsule by mouth daily.   Yes [provider]  Magnesium 250 MG TABS Take 250 mg by mouth daily.   Yes [provider]  melatonin 3 MG TABS tablet Take 3 mg by mouth at bedtime.   Yes [provider]  Multiple Vitamin (MULTIVITAMIN) tablet Take 1 tablet by mouth daily.   Yes [provider]  polyethylene glycol powder (GLYCOLAX/MIRALAX) 17 GM/SCOOP powder Take 17 g by mouth daily for 7 doses. Patient taking differently: Take 17 g by mouth daily as needed for moderate constipation. 08/22/21 08/29/21 Yes Tanda Rockers A, DO  Propylene Glycol (SYSTANE BALANCE OP) Place 1 drop into both eyes daily.   Yes [provider]  vitamin C (ASCORBIC ACID) 500 MG tablet Take 1,000 mg by mouth daily.   Yes [provider]  acetaminophen (TYLENOL) 500 MG tablet Take 1 tablet (500 mg total) by mouth every 6 (six) hours as needed. Patient not taking: Reported on 08/23/2021 08/22/21   Derwood Kaplan, MD  HYDROcodone-acetaminophen (NORCO/VICODIN) 5-325 MG tablet Take 1 tablet by mouth every 8 (eight) hours as needed. Patient not taking: Reported on 08/23/2021 08/22/21   Derwood Kaplan, MD    Physical Exam: Vitals:   08/23/21 1400 08/23/21 1430 08/23/21 1500 08/23/21 1519  BP: (!) 139/54 (!) 134/54  136/67  Pulse: 86   74  Resp:    16  Temp:      TempSrc:      SpO2: 100%  92% 99%  Weight:      Height:        Constitutional: Elderly man in who no acute distress Eyes: PERRL, lids and conjunctivae normal ENMT: Mucous membranes are dry. Posterior pharynx clear of any exudate or lesions.  Neck: normal, supple, no masses, no thyromegaly Respiratory: Normal respiratory effort without significant wheezing or rhonchi appreciated. Cardiovascular: Regular rate and rhythm No  extremity edema. 2+ pedal pulses. No carotid bruits.  Abdomen: no tenderness, no hepatosplenomegaly. Bowel sounds positive.  Musculoskeletal: no clubbing / cyanosis.  Swelling and deformity noted of the right shoulder currently in sling .  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 Skin: Bruising and swelling of the right shoulder.  Appears to have some dermatitis of the chest wall Neurologic: CN 2-12 grossly intact. Patient is able to move all extremities, but has weakness on the left side Psychiatric: Patient appears to be confused. Alert and oriented only to person.   Data Reviewed:  EKG reveals sinus rhythm at 80 bpm  Assessment and Plan:  Falls Acute.  Patient presents after having 2 falls recently.  Patient's son attributes the symptoms to sleep deprivation and lack of food after recent emergency room visits.  Labs noted mildly elevated CK of 750.  Patient was noted to have concentrated urine and signs of renal insufficiency and concern for dehydration as a possible cause of falls. -Admit to a medical telemetry bed -Check orthostatic vital signs -Check vitamin B12 -Consult PT/OT to eval and treat -Transitions of care consulted  Acute metabolic encephalopathy Patient was noted to be more altered than baseline.  Patient's son reports that he is usually alert and oriented to person and place but not always time.  MRI of the brain has been obtained and noted to show no acute abnormality.  Suspect secondary to dehydration. -Neurochecks  Right proximal humerus fracture secondary to fall Patient was noted to have fracture of the right proximal humerus yesterday.  X-rays did not note any significant change in the fracture. -Recommended to follow-up with orthopedics in outpatient setting  Leukocytosis Acute.  WBC elevated 10.6.  Urinalysis did not show any signs of infection.  Chest x-ray noted no acute abnormality other than the right proximal humerus fracture.  Suspect this is  likely related to the fracture.  -Recheck CBC tomorrow morning  Renal insufficiency Patient presents with creatinine 1.29 with BUN 35. Creatinine previously had been around 1.1.  Patient noted to have elevated CK of 750 and urine noted to be concentrated on urinalysis.  Patient has been given 1 L normal saline IV fluids. -Hold nephrotoxic agents such as patient's furosemide -Normal saline IV fluids at 75 mL/h overnight  Macrocytic anemia Hemoglobin 11.9 g/dL with MCV 696, but previous was 12.7 on 4/28.  He has been on a daily aspirin. -Recheck CBC in morning  History of stroke with residual deficit Patient with prior history of right-sided stroke in 2018 with residual left-sided weakness.  Right-sided inguinal hernia Contains bowel based off recent CT imaging, but no signs of incarceration  Advance Care Planning:   Code Status: Full Code   Consults: None  Family Communication: Son updated at bedside  Severity of Illness: The appropriate patient status for this patient is INPATIENT. Inpatient status is judged to be reasonable and necessary in order to provide the required intensity of service to ensure the patient's safety. The patient's presenting symptoms, physical exam findings, and initial radiographic and laboratory data in the context of their chronic comorbidities is felt to place them at high risk for further clinical deterioration. Furthermore, it is not anticipated that the patient will be medically stable for discharge from the hospital within 2 midnights of admission.   * I certify that at the point of admission it is my clinical judgment that the patient will require inpatient hospital care spanning beyond 2 midnights from the point of admission due to high intensity of service, high risk for further deterioration and high frequency of surveillance required.*  Author: Clydie Braun, MD 08/23/2021 3:55 PM  For on call review [email or web address].

## 2021-08-23 NOTE — ED Notes (Signed)
Pt was incontinent of BM, cleaned pt and applied a brief. ?

## 2021-08-23 NOTE — ED Notes (Signed)
Patient to MRI.

## 2021-08-23 NOTE — ED Notes (Signed)
Son at bedside.

## 2021-08-23 NOTE — ED Notes (Signed)
Patient transported to floor bed 

## 2021-08-23 NOTE — ED Provider Notes (Signed)
?[facility] ?Provider Note ? ? ?CSN: [id number] ?Arrival date & time: 08/23/21  2297 ? ?  ? ?History ? ?Chief Complaint  ?Patient presents with  ? Altered Mental Status  ? ? ?David Scott is a 81 y.o. male presenting to the emergency department due to concern for altered mental status, frequent falls.  Son reports that since he got home patient has been more, steadily confused.  Also had a fall, found on floor.  Patient had a hard time getting up.  Patient is unable to provide any significant history.  Alert and oriented to self only.  Patient was able to walk yesterday but son states he is unable to walk today.  Patient able to answer some basic questions and he denies any complaints at present. ? ?HPI ? ?  ? ?Home Medications ?Prior to Admission medications   ?Medication Sig Start Date End Date Taking? Authorizing Provider  ?albuterol (PROVENTIL HFA;VENTOLIN HFA) 108 (90 Base) MCG/ACT inhaler Inhale 2 puffs into the lungs every 6 (six) hours as needed for wheezing or shortness of breath.  09/09/17  Yes [provider]  ?aspirin 81 MG tablet Take 81 mg by mouth daily after lunch.    Yes [provider]  ?Cholecalciferol (VITAMIN D-3 PO) Take 1 capsule by mouth daily after lunch.    Yes [provider]  ?furosemide (LASIX) 20 MG tablet Take 1/2 tablet daily OK to take an extra 1/2 tablet as needed for swelling or shortness of breath ?Patient taking differently: Take 10 mg by mouth daily. Take an extra 1/2 tablet as needed for swelling or shortness of breath 03/03/21  Yes Chilton Si, MD  ?Lactobacillus (PROBIOTIC ACIDOPHILUS PO) Take 1 capsule by mouth daily.   Yes [provider]  ?Magnesium 250 MG TABS Take 250 mg by mouth daily.   Yes [provider]  ?melatonin 3 MG TABS tablet Take 3 mg by mouth at bedtime.   Yes [provider]  ?Multiple Vitamin (MULTIVITAMIN) tablet Take 1 tablet by mouth daily.   Yes  [provider]  ?polyethylene glycol powder (GLYCOLAX/MIRALAX) 17 GM/SCOOP powder Take 17 g by mouth daily for 7 doses. ?Patient taking differently: Take 17 g by mouth daily as needed for moderate constipation. 08/22/21 08/29/21 Yes Sloan Leiter, DO  ?Propylene Glycol (SYSTANE BALANCE OP) Place 1 drop into both eyes daily.   Yes [provider]  ?vitamin C (ASCORBIC ACID) 500 MG tablet Take 1,000 mg by mouth daily.   Yes [provider]  ?acetaminophen (TYLENOL) 500 MG tablet Take 1 tablet (500 mg total) by mouth every 6 (six) hours as needed. ?Patient not taking: Reported on 08/23/2021 08/22/21   Derwood Kaplan, MD  ?HYDROcodone-acetaminophen (NORCO/VICODIN) 5-325 MG tablet Take 1 tablet by mouth every 8 (eight) hours as needed. ?Patient not taking: Reported on 08/23/2021 08/22/21   Derwood Kaplan, MD  ?   ? ?Allergies    ?Benzonatate, Tamsulosin hcl, and Septra [sulfamethoxazole-trimethoprim]   ? ?Review of Systems   ?Review of Systems  ?Unable to perform ROS: Mental status change  ? ?Physical Exam ?Updated Vital Signs ?BP 136/67   Pulse 74   Temp 98 ?F (36.7 ?C) (Oral)   Resp 16   Ht 5\' 10"  (1.778 m)   Wt 56.3 kg   SpO2 99%   BMI 17.81 kg/m?  ?Physical Exam ?Vitals and nursing note reviewed.  ?Constitutional:   ?   General: He is not in acute distress. ?  Appearance: He is well-developed.  ?HENT:  ?   Head: Normocephalic and atraumatic.  ?Eyes:  ?   Conjunctiva/sclera: Conjunctivae normal.  ?Cardiovascular:  ?   Rate and Rhythm: Normal rate and regular rhythm.  ?   Heart sounds: No murmur heard. ?Pulmonary:  ?   Effort: Pulmonary effort is normal. No respiratory distress.  ?   Breath sounds: Normal breath sounds.  ?Abdominal:  ?   Palpations: Abdomen is soft.  ?   Tenderness: There is no abdominal tenderness.  ?Musculoskeletal:  ?   Cervical back: Neck supple.  ?   Comments: Right shoulder sling is intact, right shoulder has swelling, ecchymosis  ?Skin: ?   General: Skin is warm  and dry.  ?   Capillary Refill: Capillary refill takes less than 2 seconds.  ?Neurological:  ?   Mental Status: He is alert.  ?   Comments: Alert, oriented x1 but not place or time, moves all 4 extremities spontaneously and equally  ?Psychiatric:     ?   Mood and Affect: Mood normal.  ? ? ?ED Results / Procedures / Treatments   ?Labs ?(all labs ordered are listed, but only abnormal results are displayed) ?Labs Reviewed  ?CBC WITH DIFFERENTIAL/PLATELET - Abnormal; Notable for the following components:  ?    Result Value  ? WBC 10.6 (*)   ? RBC 3.54 (*)   ? Hemoglobin 11.9 (*)   ? HCT 36.1 (*)   ? MCV 102.0 (*)   ? Neutro Abs 9.4 (*)   ? Lymphs Abs 0.4 (*)   ? All other components within normal limits  ?COMPREHENSIVE METABOLIC PANEL - Abnormal; Notable for the following components:  ? Glucose, Bld 145 (*)   ? BUN 35 (*)   ? Creatinine, Ser 1.29 (*)   ? Calcium 8.8 (*)   ? Total Protein 6.0 (*)   ? GFR, Estimated 57 (*)   ? All other components within normal limits  ?CK - Abnormal; Notable for the following components:  ? Total CK 750 (*)   ? All other components within normal limits  ?URINALYSIS, ROUTINE W REFLEX MICROSCOPIC - Abnormal; Notable for the following components:  ? Color, Urine AMBER (*)   ? APPearance HAZY (*)   ? Specific Gravity, Urine 1.032 (*)   ? Ketones, ur 20 (*)   ? All other components within normal limits  ?BLOOD GAS, VENOUS - Abnormal; Notable for the following components:  ? pCO2, Ven 40 (*)   ? All other components within normal limits  ?AMMONIA  ?PROTIME-INR  ?MAGNESIUM  ? ? ?EKG ?EKG Interpretation ? ?Date/Time:  Sunday August 23 2021 08:55:05 EDT ?Ventricular Rate:  80 ?PR Interval:  155 ?QRS Duration: 100 ?QT Interval:  397 ?QTC Calculation: 458 ?R Axis:   -36 ?Text Interpretation: Sinus rhythm Left axis deviation Anteroseptal infarct, age indeterminate Lateral leads are also involved Confirmed by Marianna Fuss ([id number]) on 08/23/2021 11:16:54 AM ? ?Radiology ?DG Chest 1 View ? ?Result  Date: 08/22/2021 ?CLINICAL DATA:  Status post fall. EXAM: CHEST  1 VIEW COMPARISON:  August 22, 2021 (12:22 a.m.) FINDINGS: The heart size and mediastinal contours are within normal limits. The lungs are hyperinflated. There is no evidence of an acute infiltrate, pleural effusion or pneumothorax. An acute fracture deformity is seen involving the head and surgical neck of the proximal right humerus. IMPRESSION: Acute fracture of the proximal right humerus. Electronically Signed   By: Aram Candela M.D.   On: 08/22/2021 19:41  ? ?  DG Chest 2 View ? ?Result Date: 08/22/2021 ?CLINICAL DATA:  Chest pain. EXAM: CHEST - 2 VIEW COMPARISON:  04/05/2021. FINDINGS: The heart size and mediastinal contours are stable. There is atherosclerotic calcification of the aorta. Hyperinflation of the lungs is noted. No consolidation, effusion, or pneumothorax. There is stable deformity of the sternum. No acute osseous abnormality. IMPRESSION: Hyperinflation of the lungs with no acute cardiopulmonary process. Electronically Signed   By: Thornell SartoriusLaura  Taylor M.D.   On: 08/22/2021 00:35  ? ?DG Pelvis 1-2 Views ? ?Result Date: 08/22/2021 ?CLINICAL DATA:  Status post fall. EXAM: PELVIS - 1-2 VIEW COMPARISON:  None. FINDINGS: There is no evidence of pelvic fracture or diastasis. Radiopaque contrast from prior imaging is seen within the expected region of the urinary bladder. A large right groin hernia is present. IMPRESSION: No acute osseous abnormality. Electronically Signed   By: Aram Candelahaddeus  Houston M.D.   On: 08/22/2021 19:44  ? ?DG Shoulder Right ? ?Result Date: 08/22/2021 ?CLINICAL DATA:  Status post fall. EXAM: RIGHT SHOULDER - 2+ VIEW COMPARISON:  None. FINDINGS: An acute fracture deformity is seen involving the head and surgical neck of the proximal right humerus. There is no evidence of dislocation. Moderate to marked severity degenerative changes are seen involving the right acromioclavicular joint. Soft tissues are unremarkable. IMPRESSION:  Acute fracture of the proximal right humerus. Electronically Signed   By: Aram Candelahaddeus  Houston M.D.   On: 08/22/2021 19:45  ? ?CT Head Wo Contrast ? ?Result Date: 08/23/2021 ?CLINICAL DATA:  Recent fall, moderate to severe he

## 2021-08-23 NOTE — ED Notes (Signed)
Patient returns from MRI

## 2021-08-24 DIAGNOSIS — G9341 Metabolic encephalopathy: Secondary | ICD-10-CM | POA: Diagnosis not present

## 2021-08-24 LAB — CBC
HCT: 32.7 % — ABNORMAL LOW (ref 39.0–52.0)
Hemoglobin: 10.7 g/dL — ABNORMAL LOW (ref 13.0–17.0)
MCH: 32.8 pg (ref 26.0–34.0)
MCHC: 32.7 g/dL (ref 30.0–36.0)
MCV: 100.3 fL — ABNORMAL HIGH (ref 80.0–100.0)
Platelets: 176 10*3/uL (ref 150–400)
RBC: 3.26 MIL/uL — ABNORMAL LOW (ref 4.22–5.81)
RDW: 13.2 % (ref 11.5–15.5)
WBC: 8 10*3/uL (ref 4.0–10.5)
nRBC: 0 % (ref 0.0–0.2)

## 2021-08-24 LAB — BASIC METABOLIC PANEL
Anion gap: 8 (ref 5–15)
BUN: 35 mg/dL — ABNORMAL HIGH (ref 8–23)
CO2: 25 mmol/L (ref 22–32)
Calcium: 8.6 mg/dL — ABNORMAL LOW (ref 8.9–10.3)
Chloride: 107 mmol/L (ref 98–111)
Creatinine, Ser: 1.07 mg/dL (ref 0.61–1.24)
GFR, Estimated: >60 mL/min (ref >60)
Glucose, Bld: 107 mg/dL — ABNORMAL HIGH (ref 70–99)
Potassium: 4.4 mmol/L (ref 3.5–5.1)
Sodium: 140 mmol/L (ref 135–145)

## 2021-08-24 LAB — VITAMIN B12: Vitamin B-12: 478 pg/mL (ref 180–914)

## 2021-08-24 LAB — TSH: TSH: 0.433 u[IU]/mL (ref 0.350–4.500)

## 2021-08-24 MED ORDER — PROSOURCE PLUS PO LIQD
30.0000 mL | Freq: Two times a day (BID) | ORAL | Status: DC
  Filled 2021-08-24 (×4): qty 30

## 2021-08-24 MED ORDER — BOOST PLUS PO LIQD
237.0000 mL | Freq: Three times a day (TID) | ORAL | Status: DC
  Administered 2021-08-27: 237 mL via ORAL
  Filled 2021-08-24 (×15): qty 237

## 2021-08-24 MED ORDER — TRAMADOL HCL 50 MG PO TABS
50.0000 mg | ORAL_TABLET | Freq: Four times a day (QID) | ORAL | Status: DC | PRN
  Filled 2021-08-24: qty 1

## 2021-08-24 MED ORDER — SODIUM CHLORIDE 0.9 % IV SOLN: INTRAVENOUS | Status: DC

## 2021-08-24 MED ORDER — PANTOPRAZOLE SODIUM 40 MG PO TBEC
40.0000 mg | DELAYED_RELEASE_TABLET | Freq: Every day | ORAL | Status: DC
  Filled 2021-08-24 (×2): qty 1

## 2021-08-24 MED ORDER — LACTATED RINGERS IV BOLUS
500.0000 mL | Freq: Once | INTRAVENOUS | Status: AC
  Administered 2021-08-24: 500 mL via INTRAVENOUS

## 2021-08-24 NOTE — Evaluation (Signed)
Physical Therapy Evaluation ?Patient Details ?Name: David Scott ?MRN: [id number] ?DOB: 26-Sep-1940 ?Today's Date: 08/24/2021 ? ?History of Present Illness ? 81 y.o. male who presents 08/23/21 after having a fall and noted to be more altered. 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  ?Pt admitted secondary to problem above with deficits below. PTA patient was walking wihtout device with shuffling short steps (per chart; no family present during evaluation). Pt currently requires max assist to get to sitting EOB, min guard assist to maintain sitting, and then had to return to supine due to orthostasis (sitting BP 63/31--see vitals flowsheet). Currently pt is limited medically in ability to fully assess mobility, however anticipate he will need incr time with therapies to be able to return home with son (assuming son can provide 24/7 assist). Patient has had 2 recent falls.  Anticipate patient will benefit from PT to address problems listed below.Will continue to follow acutely to maximize functional mobility independence and safety.   ?   ?   ? ?Recommendations for follow up therapy are one component of a multi-disciplinary discharge planning process, led by the attending physician.  Recommendations may be updated based on patient status, additional functional criteria and insurance authorization. ? ?Follow Up Recommendations Skilled nursing-short term rehab (<3 hours/day) (unless significant improvement once no longer orthostatic and son can provide care) ? ?  ?Assistance Recommended at Discharge Frequent or constant Supervision/Assistance  ?Patient can return home with the following ? A lot of help with walking and/or transfers;Assistance with cooking/housework;Direct supervision/assist for medications management;Direct supervision/assist for financial management;Assist for  transportation;Help with stairs or ramp for entrance ? ?  ?Equipment Recommendations None recommended by PT  ?Recommendations for Other Services ?    ?  ?Functional Status Assessment Patient has had a recent decline in their functional status and demonstrates the ability to make significant improvements in function in a reasonable and predictable amount of time.  ? ?  ?Precautions / Restrictions Precautions ?Precautions: Fall ?Required Braces or Orthoses: Sling ?Restrictions ?Weight Bearing Restrictions: Yes ?RUE Weight Bearing: Non weight bearing  ? ?  ? ?Mobility ? Bed Mobility ?Overal bed mobility: Needs Assistance ?Bed Mobility: Sidelying to Sit, Sit to Sidelying ?  ?Sidelying to sit: Max assist, HOB elevated ?  ?  ?Sit to sidelying: Max assist ?General bed mobility comments: assisted with raising/lowering torso; no assist ?  ? ?Transfers ?  ?  ?  ?  ?  ?  ?  ?  ?  ?General transfer comment: unable to attempt due to low BP in sitting ?  ? ?Ambulation/Gait ?  ?  ?  ?  ?  ?  ?  ?  ? ?Stairs ?  ?  ?  ?  ?  ? ?Wheelchair Mobility ?  ? ?Modified Rankin (Stroke Patients Only) ?  ? ?  ? ?Balance Overall balance assessment: Needs assistance ?Sitting-balance support: No upper extremity supported, Feet unsupported ?Sitting balance-Leahy Scale: Fair ?Sitting balance - Comments: sat EOB with close supervision while assessing BP ?  ?  ?  ?Standing balance comment: unable to assess due to low BP ?  ?  ?  ?  ?  ?  ?  ?  ?  ?  ?  ?   ? ? ? ?Pertinent Vitals/Pain Pain Assessment ?Pain Assessment: PAINAD ?Breathing: normal ?Negative Vocalization: none ?Facial Expression: smiling or inexpressive ?  Body Language: relaxed ?Consolability: no need to console ?PAINAD Score: 0  ? ? ?Home Living Family/patient expects to be discharged to:: Unsure ?  ?  ?  ?  ?  ?  ?  ?  ?  ?Additional Comments: pt confused; no family present  ?  ?Prior Function Prior Level of Function : Needs assist ?  ?  ?  ?  ?  ?  ?Mobility Comments: per chart would  walk with short, shuffling steps without a device ?  ?  ? ? ?Hand Dominance  ?   ? ?  ?Extremity/Trunk Assessment  ? Upper Extremity Assessment ?Upper Extremity Assessment: Defer to OT evaluation ?  ? ?Lower Extremity Assessment ?Lower Extremity Assessment: RLE deficits/detail;LLE deficits/detail ?RLE Deficits / Details: AAROM/AROM WFL; difficult to assess strength due to complexity of task; grossly 3+ ?LLE Deficits / Details: AAROM/AROM WFL; difficult to assess strength due to complexity of task; grossly 3+ ?  ? ?Cervical / Trunk Assessment ?Cervical / Trunk Assessment: Kyphotic (mild)  ?Communication  ? Communication: Other (comment) (not initiating conversation, but answering 'good morning" and "yes" when name called)  ?Cognition Arousal/Alertness: Awake/alert (but with eyes closed; following commands, answering with one word answers) ?Behavior During Therapy: Flat affect ?Overall Cognitive Status: Difficult to assess ?  ?  ?  ?  ?  ?  ?  ?  ?  ?  ?  ?  ?  ?  ?  ?  ?General Comments: not very verbal; following one step commands 90% of time (wiggle toes, bend and straighten each leg, tries to open eyes, show me your tongue) ?  ?  ? ?  ?General Comments   ? ?  ?Exercises    ? ?Assessment/Plan  ?  ?PT Assessment Patient needs continued PT services  ?PT Problem List Decreased strength;Decreased activity tolerance;Decreased balance;Decreased mobility;Decreased cognition;Decreased knowledge of use of DME;Decreased knowledge of precautions ? ?   ?  ?PT Treatment Interventions DME instruction;Gait training;Stair training;Functional mobility training;Therapeutic activities;Therapeutic exercise;Balance training;Cognitive remediation;Patient/family education   ? ?PT Goals (Current goals can be found in the Care Plan section)  ?Acute Rehab PT Goals ?Patient Stated Goal: unable to state, but participates during session ?PT Goal Formulation: Patient unable to participate in goal setting ?Time For Goal Achievement:  09/07/21 ?Potential to Achieve Goals: Fair ? ?  ?Frequency Min 3X/week ?  ? ? ?Co-evaluation   ?  ?  ?  ?  ? ? ?  ?AM-PAC PT "6 Clicks" Mobility  ?Outcome Measure Help needed turning from your back to your side while in a flat bed without using bedrails?: Total ?Help needed moving from lying on your back to sitting on the side of a flat bed without using bedrails?: A Lot ?Help needed moving to and from a bed to a chair (including a wheelchair)?: Total ?Help needed standing up from a chair using your arms (e.g., wheelchair or bedside chair)?: Total ?Help needed to walk in hospital room?: Total ?Help needed climbing 3-5 steps with a railing? : Total ?6 Click Score: 7 ? ?  ?End of Session Equipment Utilized During Treatment: Other (comment) (RUE sling) ?Activity Tolerance: Treatment limited secondary to medical complications (Comment) (orthostasis) ?Patient left: in bed;with call bell/phone within reach;with bed alarm set ?Nurse Communication: Mobility status;Other (comment) (following commands; orthostasis) ?PT Visit Diagnosis: History of falling (Z91.81) ?  ? ?Time: [id number] ?PT Time Calculation (min) (ACUTE ONLY): 17 min ? ? ?Charges:   PT Evaluation ?$PT Eval Low Complexity: 1 Low ?  ?  ?   ? ? ? ?  [name], PT ?Acute Rehabilitation Services  ?Pager [phone] ?Office [phone] ? ? ?Scherrie November Saylor Murry ?08/24/2021, 9:25 AM ? ?

## 2021-08-24 NOTE — Progress Notes (Signed)
Pt refusing po meds and not eating or drinking. Pt clinches mouth shut when attempting to do so. MD made aware.  ?

## 2021-08-24 NOTE — Progress Notes (Signed)
?  Transition of Care (TOC) Screening Note ? ? ?Patient Details  ?Name: David Scott ?Date of Birth: July 18, 1940 ? ? ?Transition of Care (TOC) CM/SW Contact:    ?Cyndi Bender, RN ?Phone Number: ?08/24/2021, 9:16 AM ? ? ? ?Transition of Care Department [facility]) has reviewed patient.We will continue to monitor patient advancement through interdisciplinary progression rounds and review therapy recommendations for discharge needs. ?

## 2021-08-24 NOTE — Evaluation (Signed)
Clinical/Bedside Swallow Evaluation ?Patient Details  ?Name: David Scott ?MRN: [id number] ?Date of Birth: December 29, 1940 ? ?Today's Date: 08/24/2021 ?Time: SLP Start Time (ACUTE ONLY): 1610 SLP Stop Time (ACUTE ONLY): 1627 ?SLP Time Calculation (min) (ACUTE ONLY): 17 min ? ?Past Medical History:  ?Past Medical History:  ?Diagnosis Date  ? Aortic atherosclerosis (HCC) 06/11/2021  ? Bradycardia 06/11/2021  ? CVA (cerebral vascular accident) [facility])   ? Esophageal reflux   ? Leg fracture   ? Shortness of breath 03/03/2021  ? Skin lesion 03/03/2021  ? ?Past Surgical History: History reviewed. No pertinent surgical history. ?HPI:  ?81 y.o. male who presents 08/23/21 after having a fall and noted to be more altered. +rt proximal humeral fx; +dehydration; MRI brain and CXR negative.   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  ?Assessment / Plan / Recommendation  ?Clinical Impression ? Pt is too lethargic to take POs at this time. He responded at times to his name, opening his eyes for a second or two, but is not following commands. SLP provided multimodal stimulation to try to increase alertness and then offered ice chips, to which he showed minimal signs of awareness. His lips were subtly parted upon presentation of the spoon/ice, but he did not make attempts to get the ice into his mouth. Given state of lethargy, further POs were not offered. Family at bedside reports that his swallowing PTA was [facility], so hopeful that if his alertness can improve, so will his intake. SLP will f/u to try to assess further. Will leave current diet ordered by MD in place but encouraged family to only offer if fully alert and accepting. ?SLP Visit Diagnosis: Dysphagia, unspecified (R13.10) ?   ?Aspiration Risk ? Moderate aspiration risk;Risk for inadequate nutrition/hydration  ?  ?Diet Recommendation Dysphagia 3 (Mech soft);Thin liquid  ? ?Liquid Administration via:  Cup;Straw ?Medication Administration: Crushed with puree ?Supervision: Staff to assist with self feeding;Full supervision/cueing for compensatory strategies ?Compensations: Minimize environmental distractions;Slow rate;Small sips/bites ?Postural Changes: Seated upright at 90 degrees  ?  ?Other  Recommendations Oral Care Recommendations: Oral care QID   ? ?Recommendations for follow up therapy are one component of a multi-disciplinary discharge planning process, led by the attending physician.  Recommendations may be updated based on patient status, additional functional criteria and insurance authorization. ? ?Follow up Recommendations Skilled nursing-short term rehab (<3 hours/day)  ? ? ?  ?Assistance Recommended at Discharge Frequent or constant Supervision/Assistance  ?Functional Status Assessment    ?Frequency and Duration min 2x/week  ?2 weeks ?  ?   ? ?Prognosis Prognosis for Safe Diet Advancement: Good ?Barriers to Reach Goals: Cognitive deficits  ? ?  ? ?Swallow Study   ?General HPI: 81 y.o. male who presents 08/23/21 after having a fall and noted to be more altered. +rt proximal humeral fx; +dehydration; MRI brain and CXR negative.   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, and bradycardia ?Type of Study: Bedside Swallow Evaluation ?Previous Swallow Assessment: none in chart ?Diet Prior to this Study: Dysphagia 3 (soft);Thin liquids ?Temperature Spikes Noted: No ?Respiratory Status: Room air ?History of Recent Intubation: No ?Behavior/Cognition: Lethargic/Drowsy;Doesn't follow directions ?Oral Cavity Assessment: Other (comment) (UTA) ?Oral Care Completed by SLP: No ?Oral Cavity - Dentition: Other (Comment) (some dentition noted anteriorly but not really able to see in his mouth) ?Self-Feeding Abilities: Total assist ?Patient Positioning: Upright in bed ?Baseline Vocal Quality: Low vocal  intensity ?Volitional Cough: Cognitively unable to elicit ?Volitional  Swallow: Unable to elicit  ?  ?Oral/Motor/Sensory Function Overall Oral Motor/Sensory Function: Other (comment) (not following commands for direct assessment)   ?Ice Chips Ice chips: Impaired ?Presentation: Spoon ?Oral Phase Impairments: Poor awareness of bolus;Other (comment) (not attempts to take POs)   ?Thin Liquid Thin Liquid: Not tested  ?  ?Nectar Thick Nectar Thick Liquid: Not tested   ?Honey Thick Honey Thick Liquid: Not tested   ?Puree Puree: Not tested   ?Solid ? ? ?  Solid: Not tested  ? ?  ? ?Mahala Menghini., M.A. CCC-SLP ?Acute Rehabilitation Services ?Office [phone] ? ?Secure chat preferred ? ?08/24/2021,4:33 PM ? ? ? ?

## 2021-08-24 NOTE — TOC Initial Note (Signed)
Transition of Care (TOC) - Initial/Assessment Note  ? ? ?Patient Details  ?Name: David Scott ?MRN: [id number] ?Date of Birth: 1940-10-01 ? ?Transition of Care (TOC) CM/SW Contact:    ?Wallace, LCSW ?Phone Number: ?08/24/2021, 2:06 PM ? ?Clinical Narrative:                 ? ?CSW called pt son to discuss SNF recommendation. Son explains that pt lives at home with him in [place]. Has not been to a SNF before. CSW explains SNF process and medicare coverage. Pt son is agreeable to SNF workup. Son reports pt is not covid vaccinated. CSW completed fl2 and faxed bed requests in the hub.  ? ?Expected Discharge Plan: [place] ?Barriers to Discharge: Ship broker, Continued Medical Work up, SNF Pending bed offer ? ? ?Patient Goals and CMS Choice ?  ?  ?  ? ?Expected Discharge Plan and Services ?Expected Discharge Plan: [place] ?  ?  ?  ?Living arrangements for the past 2 months: [place] ?                ?  ?  ?  ?  ?  ?  ?  ?  ?  ?  ? ?Prior Living Arrangements/Services ?Living arrangements for the past 2 months: [place] ?Lives with:: Adult Children ?  ?       ?  ?  ?  ?  ? ?Activities of Daily Living ?  ?  ? ?Permission Sought/Granted ?  ?  ?   ?   ?   ?   ? ?Emotional Assessment ?  ?  ?  ?Orientation: : Oriented to Place, Oriented to Self ?Alcohol / Substance Use: Not Applicable ?Psych Involvement: No (comment) ? ?Admission diagnosis:  Altered mental status, unspecified altered mental status type [R41.82] ?Acute metabolic encephalopathy 99991111 ?Patient Active Problem List  ? Diagnosis Date Noted  ? Acute metabolic encephalopathy [id number]  ? Pressure injury of skin 08/23/2021  ? Inguinal hernia 08/23/2021  ? Frequent falls 08/23/2021  ? Proximal humerus fracture 08/23/2021  ? Renal insufficiency 08/23/2021  ? Macrocytic anemia 08/23/2021  ? History of stroke with residual deficit 08/23/2021  ? Bradycardia 06/11/2021  ? Aortic atherosclerosis  ([place]) 06/11/2021  ? Skin lesion 03/03/2021  ? Shortness of breath 03/03/2021  ? Supranuclear palsy ([place]) 05/10/2018  ? Stroke ([place]) 04/12/2017  ? Memory loss 02/03/2017  ? Gait disturbance 02/03/2017  ? Polyneuropathy 02/03/2017  ? Numbness 02/03/2017  ? ?PCP:  Maury Dus, MD ?Pharmacy:   ?[place] ([place]), [place] - [address] ?[address] ?[place] [address] ?Phone: [phone] Fax: [phone] ? ? ? ? ?Social Determinants of Health (SDOH) Interventions ?  ? ?Readmission Risk Interventions ?   ? View : No data to display.  ?  ?  ?  ? ? ? ?

## 2021-08-24 NOTE — Evaluation (Signed)
Occupational Therapy Evaluation ?Patient Details ?Name: David Scott ?MRN: [id number] ?DOB: Jul 01, 1940 ?Today's Date: 08/24/2021 ? ? ?History of Present Illness 81 y.o. male who presents 08/23/21 after having a fall and noted to be more altered. 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 ?Clinical Impression ?  ?Pt lives with his son and walks without AD. He reports he does not drive. Pt not able to offer information about PLOF in ADLs. Pt presents with lethargy, maintaining his eyes closed with varying responsiveness. Total assist to roll pt to  adjust R UE sling and don gown. He reports he is R hand dominant. Pt is currently dependent in all ADLs. Pt receiving fluids following hypotension with PT when sitting EOB earlier this morning. Recommending SNF for ST rehab. Will follow acutely. ?   ? ?Recommendations for follow up therapy are one component of a multi-disciplinary discharge planning process, led by the attending physician.  Recommendations may be updated based on patient status, additional functional criteria and insurance authorization.  ? ?Follow Up Recommendations ? Skilled nursing-short term rehab (<3 hours/day)  ?  ?Assistance Recommended at Discharge Frequent or constant Supervision/Assistance  ?Patient can return home with the following Two people to help with walking and/or transfers;A lot of help with bathing/dressing/bathroom;Assistance with cooking/housework;Assistance with feeding;Direct supervision/assist for medications management;Direct supervision/assist for financial management;Assist for transportation;Help with stairs or ramp for entrance ? ?  ?Functional Status Assessment ? Patient has had a recent decline in their functional status and/or demonstrates limited ability to make significant improvements in function in a reasonable and predictable amount of time   ?Equipment Recommendations ? Other (comment) (defer to next venue)  ?  ?Recommendations for Other Services   ? ? ?  ?Precautions / Restrictions Precautions ?Precautions: Fall ?Required Braces or Orthoses: Sling ?Restrictions ?Weight Bearing Restrictions: Yes ?RUE Weight Bearing: Non weight bearing  ? ?  ? ?Mobility Bed Mobility ?Overal bed mobility: Needs Assistance ?Bed Mobility: Rolling ?Rolling: Total assist ?  ?  ?  ?  ?General bed mobility comments: assisted to roll to reposition sling and for comfort ?  ? ?Transfers ?  ?  ?  ?  ?  ?  ?  ?  ?  ?General transfer comment: deferred, getting fluid bolus with earlier hypotension ?  ? ?  ?Balance   ?  ?  ?  ?  ?  ?  ?  ?  ?  ?  ?  ?  ?  ?  ?  ?  ?  ?  ?   ? ?ADL either performed or assessed with clinical judgement  ? ?ADL   ?  ?  ?  ?  ?  ?  ?  ?  ?  ?  ?  ?  ?  ?  ?  ?  ?  ?  ?  ?General ADL Comments: total assist  ? ? ? ?Vision Ability to See in Adequate Light: 0 Adequate ?Additional Comments: maintaining eyes closed much of session  ?   ?Perception   ?  ?Praxis   ?  ? ?Pertinent Vitals/Pain Pain Assessment ?Pain Assessment: Faces ?Faces Pain Scale: Hurts even more ?Pain Location: R shoulder with rearranging sling ?Pain Descriptors / Indicators: Grimacing, Guarding, Discomfort ?Pain Intervention(s): Monitored during session, Repositioned  ? ? ? ?Hand Dominance Right ?  ?Extremity/Trunk Assessment Upper Extremity Assessment ?Upper Extremity Assessment: RUE deficits/detail;LUE  deficits/detail ?RUE Deficits / Details: perfomed PROM elbow to hand, shoulder immobilized in sling ?RUE: Unable to fully assess due to immobilization ?RUE Coordination: decreased gross motor ?LUE Deficits / Details: generalized weakness, unable to conform to requirements of manual muscle test ?  ?Lower Extremity Assessment ?Lower Extremity Assessment: RLE deficits/detail;LLE deficits/detail ?RLE Deficits / Details: AAROM/AROM WFL; difficult to assess strength due to complexity of task;  grossly 3+ ?LLE Deficits / Details: AAROM/AROM WFL; difficult to assess strength due to complexity of task; grossly 3+ ?  ?Cervical / Trunk Assessment ?Cervical / Trunk Assessment: Kyphotic (mild) ?  ?Communication Communication ?Communication: Expressive difficulties (minimal verbalization) ?  ?Cognition Arousal/Alertness: Lethargic ?Behavior During Therapy: Flat affect ?Overall Cognitive Status: Impaired/Different from baseline ?Area of Impairment: Following commands ?  ?  ?  ?  ?  ?  ?  ?  ?  ?  ?  ?Following Commands: Follows one step commands inconsistently ?  ?  ?  ?General Comments: answering yes/no questions inconsistently, speaking in short phrases ?  ?  ?General Comments    ? ?  ?Exercises   ?  ?Shoulder Instructions    ? ? ?Home Living Family/patient expects to be discharged to:: Unsure ?  ?  ?  ?  ?  ?  ?  ?  ?  ?  ?  ?  ?  ?  ?  ?  ?Additional Comments: pt confused; no family present, per chart lives with son. Pt reports he walks without AD and doesn't drive. ?  ? ?  ?Prior Functioning/Environment Prior Level of Function : Needs assist ?  ?  ?  ?  ?  ?  ?Mobility Comments: per chart would walk with short, shuffling steps without a device ?  ?  ? ?  ?  ?OT Problem List: Decreased strength;Impaired balance (sitting and/or standing);Decreased coordination;Decreased cognition;Pain;Impaired UE functional use ?  ?   ?OT Treatment/Interventions: Self-care/ADL training;Therapeutic activities;Patient/family education;Balance training;Cognitive remediation/compensation  ?  ?OT Goals(Current goals can be found in the care plan section) Acute Rehab OT Goals ?OT Goal Formulation: Patient unable to participate in goal setting ?Time For Goal Achievement: 09/07/21 ?Potential to Achieve Goals: Fair ?ADL Goals ?Pt Will Perform Eating: with min assist;sitting ?Pt Will Perform Grooming: with min assist;sitting ?Pt Will Perform Upper Body Bathing: with mod assist;sitting ?Pt Will Perform Upper Body Dressing: with mod  assist;sitting ?Pt Will Transfer to Toilet: with mod assist;stand pivot transfer;bedside commode ?Additional ADL Goal #1: Pt will perform bed mobility with moderate assistance in preparation for ADLs. ?Additional ADL Goal #2: Pt will demonstrate sustained attention as a precursor to ADLs.  ?OT Frequency: Min 2X/week ?  ? ?Co-evaluation   ?  ?  ?  ?  ? ?  ?AM-PAC OT "6 Clicks" Daily Activity     ?Outcome Measure Help from another person eating meals?: Total ?Help from another person taking care of personal grooming?: Total ?Help from another person toileting, which includes using toliet, bedpan, or urinal?: Total ?Help from another person bathing (including washing, rinsing, drying)?: Total ?Help from another person to put on and taking off regular upper body clothing?: Total ?Help from another person to put on and taking off regular lower body clothing?: Total ?6 Click Score: 6 ?  ?End of Session   ? ?Activity Tolerance:   ?Patient left: in bed;with call bell/phone within reach;with bed alarm set ? ?OT Visit Diagnosis: Pain;History of falling (Z91.81);Other symptoms and signs involving cognitive function;Muscle weakness (generalized) (M62.81) ?Pain -  Right/Left: Right ?Pain - part of body: Shoulder  ?              ?Time: [id number] ?OT Time Calculation (min): 21 min ?Charges:  OT General Charges ?$OT Visit: 1 Visit ?OT Evaluation ?$OT Eval Moderate Complexity: 1 Mod ? ?Martie RoundJulie Ladawn Boullion, OTR/L ?Acute Rehabilitation Services ?Pager: [phone] ?Office: [phone]  ? ?Evern BioMayberry, Pranay Hilbun Lynn ?08/24/2021, 11:59 AM ?

## 2021-08-24 NOTE — Progress Notes (Signed)
?                                  PROGRESS NOTE                                             ?                                                                                                                     ?                                         ? ? Patient Demographics:  ? ? David Scott, is a 81 y.o. male, DOB - 1940-05-25, [id number] ? ?Outpatient Primary MD for the patient is David Dus, MD    LOS - 1  Admit date - 08/23/2021   ? ?Chief Complaint  ?Patient presents with  ? Altered Mental Status  ?    ? ?Brief Narrative (HPI from H&P) - 81 y.o. 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 ? ? Subjective:  ? ? David Scott today has, No headache, No chest pain, No abdominal pain - No Nausea, No new weakness tingling or numbness, no SOB. ? ? Assessment  & Plan :  ? ? ?Acute metabolic encephalopathy in the presence of severe dehydration, AKI and severe protein calorie malnutrition. - he appears extremely frail and cachectic, CT scan of the head done over the last 2 days 2 different studies remains nonacute and MRI brain, he has no focal deficits, will continue treating him with supportive care which will include IV fluids, protein supplementation, PT-OT, may require placement as he is extremely frail and looks with his son. ? ? ?2.  AKI.  Hydrate and monitor. ? ?3.  Chronic right-sided inguinal hernia.  Recent CT imaging shows it contains bowel but without any incarceration, nontender abdomen.  We will continue to monitor. ? ?4.  History of stroke with minimal left-sided weakness on a  chronic basis.  Supportive care.  Continue aspirin for secondary prevention. ? ?5.  Recent fall with proximal right humerus fracture present on admission.  Arm in sling, as per orthopedics outpatient follow-up, this was during previous hospital visit few days prior to this admission. ? ?6.  Chronic macrocytic anemia.  Stable TSH and B12, outpatient PCP to monitor. ? ?   ? ?  Condition - Extremely Guarded ? ?Family Communication  :   ? ?Son David Scott [phone] on 08/24/21 ? ?Code Status :  DNR ? ?Consults  :  None ? ?PUD Prophylaxis :  ? ? Procedures  :    ? ?CT ABd  -Pelvis - 1. No acute findings in the abdomen or pelvis. 2. Large right groin hernia contains small bowel and colon without evidence for overt obstruction. 3. Left colonic diverticulosis without diverticulitis. 4. Prostatomegaly with evidence of median lobe hypertrophy ? ?MRI brain.    No evidence of recent infarction, hemorrhage, or mass. Similar chronic/nonemergent findings detailed above. ? ?CT  Head x 2  -  Atrophy with small vessel chronic ischemic changes of deep cerebral white matter. No acute intracranial abnormalities. Multilevel degenerative disc and facet disease changes of the cervical spine. 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 No acute cervical spine abnormalities. ? ?   ? ?Disposition Plan  :   ? ?Status is: Inpatient ? ?DVT Prophylaxis  :   ? ?enoxaparin (LOVENOX) injection 40 mg Start: 08/23/21 2200 ? ?Lab Results  ?Component Value Date  ? PLT 176 08/23/2021  ? ? ?Diet :  ?Diet Order   ? ?       ?  DIET SOFT Room service appropriate? Yes; Fluid consistency: Thin  Diet effective now       ?  ? ?  ?  ? ?  ?  ? ?Inpatient Medications ? ?Scheduled Meds: ? (feeding supplement) PROSource Plus  30 mL Oral BID BM  ? acidophilus  1 capsule Oral Daily  ? aspirin EC  81 mg Oral QPC lunch  ? enoxaparin (LOVENOX) injection  40 mg Subcutaneous Q24H  ? lactose free nutrition  237 mL Oral TID WC  ? melatonin   3 mg Oral QHS  ? polyvinyl alcohol   Both Eyes Daily  ? sodium chloride flush  3 mL Intravenous Q12H  ? ?Continuous Infusions: ? sodium chloride    ? ?PRN Meds:.acetaminophen **OR** acetaminophen, albuterol, polyethylene glycol, traMADol ? ?Antibiotics  :   ? ?Anti-infectives (From admission, onward)  ? ? None  ? ?  ? ? ? Time Spent in minutes  30 ? ? ?David Scott M.D on 08/24/2021 at 7:46 AM ? ?To page go to [email or web address]  ? ?[facility] -  Office  [phone] ? ?See all Orders from today for further details ? ? ? Objective:  ? ?Vitals:  ? 08/23/21 2030 08/23/21 2152 08/23/21 2305 08/24/21 0400  ?BP: (!) 163/71 (!) 158/67 (!) 147/115 (!) 145/72  ?Pulse: 72 81 74 73  ?Resp: 16 16 19 20   ?Temp:  99.4 ?F (37.4 ?C) 98.9 ?F (37.2 ?C) 99 ?F (37.2 ?C)  ?TempSrc:  Oral Axillary Axillary  ?SpO2: 95% 93% 95% 93%  ?Weight:      ?Height:      ? ? ?Wt Readings from Last 3 Encounters:  ?08/23/21 56.3 kg  ?06/11/21 56.3 kg  ?03/03/21 55.7 kg  ? ? ?No intake or output data in the 24 hours ending 08/24/21 0746 ? ? ?Physical Exam ? ?Elderly white gentleman who is extremely frail and cachectic lying in hospital bed in no distress but sleepy, able to answer few questions with soft voice and follows some basic commands, right arm in sling, ?[place].AT,PERRAL ?Supple Neck, No JVD,   ?Symmetrical Chest wall movement, Good air movement bilaterally, CTAB ?RRR,No Gallops,Rubs or new Murmurs,  ?+ve B.Sounds, Abd Soft, No tenderness,   ?No  Cyanosis, Clubbing or edema  ?  ? ?RN pressure injury documentation: ?Pressure Injury 08/23/21 Sacrum Mid Stage 2 -  Partial thickness loss of dermis presenting as a shallow open injury with a red, pink wound bed without slough. pink bedding (Active)  ?08/23/21 0933  ?Location: Sacrum  ?Location Orientation: Mid  ?Staging: Stage 2 -  Partial thickness loss of dermis presenting as a shallow open injury with a red, pink wound bed without slough.  ?Wound Description (Comments): pink bedding  ?Present on  Admission: Yes  ?Dressing Type Moist to dry;Foam - Lift dressing to assess site every shift 08/23/21 2300  ? ? ? Data Review:  ? ? ?CBC ?Recent Labs  ?Lab 08/21/21 ?2354 08/22/21 ?[id number] 08/23/21 ?0945 08/23/21 ?2345  ?WBC 3.9*  --  10.6* 8.0  ?HGB 12.7* 14.6 11.9* 10.7*  ?HCT 37.5* 43.0 36.1* 32.7*  ?PLT 190  --  190 176  ?MCV 100.5*  --  102.0* 100.3*  ?MCH 34.0  --  33.6 32.8  ?MCHC 33.9  --  33.0 32.7  ?RDW 13.2  --  13.1 13.2  ?LYMPHSABS  --   --  0.4*  --   ?MONOABS  --   --  0.7  --   ?EOSABS  --   --  0.0  --   ?BASOSABS  --   --  0.0  --   ? ? ?Electrolytes ?Recent Labs  ?Lab 08/21/21 ?2354 08/22/21 ?0805 08/22/21 ?[id number] 08/23/21 ?0945 08/23/21 ?2345  ?NA 137  --  137 137 140  ?K 4.4  --  4.2 4.9 4.4  ?CL 103  --   --  104 107  ?CO2 26  --   --  24 25  ?GLUCOSE 115*  --   --  145* 107*  ?BUN 32*  --   --  35* 35*  ?CREATININE 1.14  --   --  1.29* 1.07  ?CALCIUM 8.8*  --   --  8.8* 8.6*  ?AST  --  31  --  35  --   ?ALT  --  29  --  32  --   ?ALKPHOS  --  32  --  63  --   ?BILITOT  --  0.7  --  1.1  --   ?ALBUMIN  --  4.0  --  3.7  --   ?MG  --   --   --  2.3  --   ?INR  --   --   --  1.2  --   ?AMMONIA  --  11  --  <10  --   ?BNP  --  139.4*  --   --   --   ? ? ?------------------------------------------------------------------------------------------------------------------ ?No results for input(s): CHOL, HDL, LDLCALC, TRIG, CHOLHDL, LDLDIRECT in the last 72 hours. ? ?No results found for: HGBA1C ? ?No results for input(s): TSH, T4TOTAL, T3FREE, THYROIDAB in the last 72 hours. ? ?Invalid input(s): FREET3 ?------------------------------------------------------------------------------------------------------------------ ?ID Labs ?Recent Labs  ?Lab 08/21/21 ?2354 08/23/21 ?0945 08/23/21 ?2345  ?WBC 3.9* 10.6* 8.0  ?PLT 190 190 176  ?CREATININE 1.14 1.29* 1.07  ? ? ?Radiology Reports ?DG Chest 1 View ? ?Result Date: 08/22/2021 ?CLINICAL DATA:  Status post fall. EXAM: CHEST  1 VIEW COMPARISON:  August 22, 2021 (12:22  a.m.) FINDINGS: The heart size and mediastinal contours are within normal limits. The lungs are hyperinflated. There is no evidence of an acute infiltrate, pleural effusion or pneumothorax. An acute fractur

## 2021-08-25 ENCOUNTER — Inpatient Hospital Stay (HOSPITAL_COMMUNITY): Payer: Medicare Other

## 2021-08-25 DIAGNOSIS — Z7189 Other specified counseling: Secondary | ICD-10-CM | POA: Diagnosis not present

## 2021-08-25 DIAGNOSIS — G9341 Metabolic encephalopathy: Secondary | ICD-10-CM | POA: Diagnosis not present

## 2021-08-25 DIAGNOSIS — R4182 Altered mental status, unspecified: Secondary | ICD-10-CM

## 2021-08-25 DIAGNOSIS — Z515 Encounter for palliative care: Secondary | ICD-10-CM

## 2021-08-25 LAB — CBC WITH DIFFERENTIAL/PLATELET
Abs Immature Granulocytes: 0.02 10*3/uL (ref 0.00–0.07)
Basophils Absolute: 0 10*3/uL (ref 0.0–0.1)
Basophils Relative: 0 %
Eosinophils Absolute: 0.1 10*3/uL (ref 0.0–0.5)
Eosinophils Relative: 1 %
HCT: 28.3 % — ABNORMAL LOW (ref 39.0–52.0)
Hemoglobin: 9.6 g/dL — ABNORMAL LOW (ref 13.0–17.0)
Immature Granulocytes: 0 %
Lymphocytes Relative: 9 %
Lymphs Abs: 0.7 10*3/uL (ref 0.7–4.0)
MCH: 34.4 pg — ABNORMAL HIGH (ref 26.0–34.0)
MCHC: 33.9 g/dL (ref 30.0–36.0)
MCV: 101.4 fL — ABNORMAL HIGH (ref 80.0–100.0)
Monocytes Absolute: 0.8 10*3/uL (ref 0.1–1.0)
Monocytes Relative: 11 %
Neutro Abs: 5.5 10*3/uL (ref 1.7–7.7)
Neutrophils Relative %: 79 %
Platelets: 145 10*3/uL — ABNORMAL LOW (ref 150–400)
RBC: 2.79 MIL/uL — ABNORMAL LOW (ref 4.22–5.81)
RDW: 13.2 % (ref 11.5–15.5)
WBC: 7.1 10*3/uL (ref 4.0–10.5)
nRBC: 0 % (ref 0.0–0.2)

## 2021-08-25 LAB — COMPREHENSIVE METABOLIC PANEL
ALT: 25 U/L (ref 0–44)
AST: 38 U/L (ref 15–41)
Albumin: 2.7 g/dL — ABNORMAL LOW (ref 3.5–5.0)
Alkaline Phosphatase: 44 U/L (ref 38–126)
Anion gap: 8 (ref 5–15)
BUN: 28 mg/dL — ABNORMAL HIGH (ref 8–23)
CO2: 23 mmol/L (ref 22–32)
Calcium: 7.5 mg/dL — ABNORMAL LOW (ref 8.9–10.3)
Chloride: 111 mmol/L (ref 98–111)
Creatinine, Ser: 0.86 mg/dL (ref 0.61–1.24)
GFR, Estimated: >60 mL/min (ref >60)
Glucose, Bld: 104 mg/dL — ABNORMAL HIGH (ref 70–99)
Potassium: 3.7 mmol/L (ref 3.5–5.1)
Sodium: 142 mmol/L (ref 135–145)
Total Bilirubin: 0.9 mg/dL (ref 0.3–1.2)
Total Protein: 4.9 g/dL — ABNORMAL LOW (ref 6.5–8.1)

## 2021-08-25 LAB — MAGNESIUM: Magnesium: 1.7 mg/dL (ref 1.7–2.4)

## 2021-08-25 LAB — BRAIN NATRIURETIC PEPTIDE: B Natriuretic Peptide: 309.3 pg/mL — ABNORMAL HIGH (ref 0.0–100.0)

## 2021-08-25 MED ORDER — LACTATED RINGERS IV SOLN
INTRAVENOUS | Status: AC
Start: 2021-08-25 — End: 2021-08-26

## 2021-08-25 NOTE — TOC Progression Note (Signed)
Transition of Care (TOC) - Progression Note  ? ? ?Patient Details  ?Name: David Scott ?MRN: [id number] ?Date of Birth: [birth year] ? ?Transition of Care (TOC) CM/SW Contact  ?Mearl Latin, LCSW ?Phone Number: ?08/25/2021, 10:14 AM ? ?Clinical Narrative:    ?CSW spoke with patient's son and emailed SNF bed offers to him ([email or web address]) as requested.  ? ? ?Expected Discharge Plan: Skilled Nursing Facility ?Barriers to Discharge: English as a second language teacher, Continued Medical Work up, SNF Pending bed offer ? ?Expected Discharge Plan and Services ?Expected Discharge Plan: Skilled Nursing Facility ?  ?  ?  ?Living arrangements for the past 2 months: Single Family Home ?                ?  ?  ?  ?  ?  ?  ?  ?  ?  ?  ? ? ?Social Determinants of Health (SDOH) Interventions ?  ? ?Readmission Risk Interventions ?   ? View : No data to display.  ?  ?  ?  ? ? ?

## 2021-08-25 NOTE — Procedures (Addendum)
Patient Name: David Scott  ?MRN: [id number]  ?Epilepsy Attending: Charlsie Quest  ?Referring Physician/Provider: Leroy Sea, MD ?Date: 08/25/2021 ?Duration: 23.29 mins ? ?Patient history: 81 year old male with altered mental status and intermittent myoclonic jerks.  EEG to evaluate for seizure ? ?Level of alertness: Awake ? ?AEDs during EEG study: None ? ?Technical aspects: This EEG study was done with scalp electrodes positioned according to the 10-20 International system of electrode placement. Electrical activity was acquired at a sampling rate of 500Hz  and reviewed with a high frequency filter of 70Hz  and a low frequency filter of 1Hz . EEG data were recorded continuously and digitally stored.  ? ?Description: No clear posterior dominant rhythm was seen.  EEG showed continuous generalized 3 to 7 Hz theta-delta slowing.  Intermittent generalized periodic discharges with triphasic morphology were also noted at 1.5 to 2 Hz.  Hyperventilation and photic stimulation were not performed.    ? ?ABNORMALITY ?- Periodic discharges with triphasic morphology, generalized ( GPDs) ?- Continuous slow, generalized ? ?IMPRESSION: ?This study showed generalized periodic discharges with triphasic morphology. This EEG pattern can be on the ictal-interictal continuum.  However the morphology, frequency and reactivity to stimulation is most likely due to toxic-metabolic causes.  Additionally, the study is suggestive of moderate diffuse encephalopathy, nonspecific etiology. No seizures were seen throughout the recording. ? ?  ? ?

## 2021-08-25 NOTE — Progress Notes (Addendum)
?                                  PROGRESS NOTE                                             ?                                                                                                                     ?                                         ? ? Patient Demographics:  ? ? David Scott, is a 81 y.o. male, DOB - [birth year], [id number] ? ?Outpatient Primary MD for the patient is Maury Dus, MD    LOS - 2  Admit date - 08/23/2021   ? ?Chief Complaint  ?Patient presents with  ? Altered Mental Status  ?    ? ?Brief Narrative (HPI from H&P) - 81 y.o. 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 ? ? Subjective:  ? ?Patient in bed, appears comfortable, denies any headache, no fever, no chest pain or pressure, no shortness of breath , no abdominal pain. No new focal weakness. ? ? Assessment  & Plan :  ? ? ?Acute metabolic encephalopathy in the presence of severe dehydration, AKI and severe protein calorie malnutrition. - he appears extremely frail and cachectic, CT scan of the head done over the last 2 days 2 different studies remains nonacute and MRI brain, he has no focal deficits, will continue treating him with supportive care which will include IV fluids, protein supplementation, PT-OT, he is extremely frail, currently living with his son most likely will require placement.  We will also involve palliative care for long-term goals of care.  He appears to have a very poor baseline. ? ?Had discussion with patient's son on 08/25/2021 at 7pm, he understands that patient is not doing well, agrees for  DNR, continue gentle medical treatment directed towards comfort.  No heroics.  Likely transition to full palliative care/comfort care soon. ? ? ?2.  AKI.  Hydrate and monitor. ? ?3.  Chronic right-sided inguinal hernia.  Recent CT imaging shows it contains bowel but without any incarceration, nontender abdomen.  We will continue to monitor. ? ?4.  History of stroke with  minimal left-sided weakness on a chronic basis.  Supportive care.  Continue aspirin for secondary prevention. ? ?5.  Generalized weakness, severe deconditioning with recent fall with proximal right humerus fracture present on admission.  Arm in sling, as per orthopedics outpatient follow-up, this was during previous hospital visit few days prior to this admission. ? ?6.  Chronic macrocytic anemia.  Stable TSH and B12, outpatient PCP to monitor. ? ?7.  Intermittent myoclonic jerks noted.  Check EEG to rule out underlying seizure activity. ? ?   ? ?Condition - Extremely Guarded ? ?Family Communication  :   ? ?Son Fara Olden [phone] on 08/24/21,  08/25/21 on pts room phone - DNR, Meds Rx, focus on comfort. ? ?Code Status :  DNR ? ?Consults  :  Pall Care ? ?PUD Prophylaxis :  ? ? Procedures  :    ? ?EEG ordered 08/25/2021.   ? ?CT Abd  - Pelvis - 1. No acute findings in the abdomen or pelvis. 2. Large right groin hernia contains small bowel and colon without evidence for overt obstruction. 3. Left colonic diverticulosis without diverticulitis. 4. Prostatomegaly with evidence of median lobe hypertrophy ? ?MRI brain.    No evidence of recent infarction, hemorrhage, or mass. Similar chronic/nonemergent findings detailed above. ? ?CT  Head x 2  -  Atrophy with small vessel chronic ischemic changes of deep cerebral white matter. No acute intracranial abnormalities. Multilevel degenerative disc and facet disease changes of the cervical spine. 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 No acute cervical spine  abnormalities. ? ?   ? ?Disposition Plan  :   ? ?Status is: Inpatient ? ?DVT Prophylaxis  :   ? ?Place TED hose Start: 08/24/21 1024 ?enoxaparin (LOVENOX) injection 40 mg Start: 08/23/21 2200 ? ?Lab Results  ?Component Value Date  ? PLT 145 (L) 08/25/2021  ? ? ?Diet :  ?Diet Order   ? ?       ?  DIET DYS 3 Room service appropriate? Yes; Fluid consistency: Thin  Diet effective now       ?  ? ?  ?  ? ?  ?  ? ?Inpatient Medications ? ?Scheduled Meds: ? (feeding supplement) PROSource Plus  30 mL Oral BID BM  ? acidophilus  1 capsule Oral Daily  ? aspirin EC  81 mg Oral QPC lunch  ? enoxaparin (LOVENOX) injection  40 mg Subcutaneous Q24H  ? lactose free nutrition  237 mL Oral TID WC  ? melatonin  3 mg Oral QHS  ? pantoprazole  40 mg Oral Daily  ? polyvinyl alcohol   Both Eyes Daily  ? sodium chloride flush  3 mL Intravenous Q12H  ? ?Continuous Infusions: ? lactated ringers    ? ?PRN Meds:.acetaminophen **OR** acetaminophen, albuterol, polyethylene glycol, traMADol ? ?Antibiotics  :   ? ?Anti-infectives (From admission, onward)  ? ? None  ? ?  ? ? ? Time Spent in minutes  30 ? ? ?Lala Lund M.D on 08/25/2021 at 10:10 AM ? ?To page go to [email or web address]  ? ?[facility] -  Office  [phone] ? ?See all Orders from today for further details ? ? ? Objective:  ? ?Vitals:  ? 08/24/21 1601 08/24/21 2000 08/25/21 0309 08/25/21 0805  ?BP: (!) 145/65 (!) 167/87 (!) 158/65 (!) 157/96  ?Pulse: 69 82 69 67  ?Resp: 20 20 (!) 21 19  ?Temp: 99.6 ?F (37.6 ?C) 99.7 ?F (37.6 ?C) 98.9 ?F (37.2 ?C) 98.9 ?F (37.2 ?  C)  ?TempSrc: Axillary Axillary Axillary Axillary  ?SpO2: 92% 93% 96% 93%  ?Weight:      ?Height:      ? ? ?Wt Readings from Last 3 Encounters:  ?08/23/21 56.3 kg  ?06/11/21 56.3 kg  ?03/03/21 55.7 kg  ? ? ? ?Intake/Output Summary (Last 24 hours) at 08/25/2021 1010 ?Last data filed at 08/25/2021 0700 ?Gross per 24 hour  ?Intake 936.67 ml  ?Output 1200 ml  ?Net -263.33 ml  ? ? ? ?Physical Exam ? ?Elderly white gentleman who is  extremely frail and cachectic lying in hospital bed in no distress but sleepy, able to answer few questions with soft voice and follows some basic commands, right arm in sling, noted some intermittent myoclonic jerks mostly on the left side ?[place].AT,PERRAL ?Supple Neck, No JVD,   ?Symmetrical Chest wall movement, Good air movement bilaterally, CTAB ?RRR,No Gallops, Rubs or new Murmurs,  ?+ve B.Sounds, Abd Soft, No tenderness,   ?No Cyanosis, Clubbing or edema  ? ?  ? ?RN pressure injury documentation: ?Pressure Injury 08/23/21 Sacrum Mid Stage 2 -  Partial thickness loss of dermis presenting as a shallow open injury with a red, pink wound bed without slough. pink bedding (Active)  ?08/23/21 0933  ?Location: Sacrum  ?Location Orientation: Mid  ?Staging: Stage 2 -  Partial thickness loss of dermis presenting as a shallow open injury with a red, pink wound bed without slough.  ?Wound Description (Comments): pink bedding  ?Present on Admission: Yes  ?Dressing Type Foam - Lift dressing to assess site every shift 08/24/21 1942  ? ? ? Data Review:  ? ? ?CBC ?Recent Labs  ?Lab 08/21/21 ?2354 08/22/21 ?[id number] 08/23/21 ?0945 08/23/21 ?2345 08/25/21 ?0148  ?WBC 3.9*  --  10.6* 8.0 7.1  ?HGB 12.7* 14.6 11.9* 10.7* 9.6*  ?HCT 37.5* 43.0 36.1* 32.7* 28.3*  ?PLT 190  --  190 176 145*  ?MCV 100.5*  --  102.0* 100.3* 101.4*  ?MCH 34.0  --  33.6 32.8 34.4*  ?MCHC 33.9  --  33.0 32.7 33.9  ?RDW 13.2  --  13.1 13.2 13.2  ?LYMPHSABS  --   --  0.4*  --  0.7  ?MONOABS  --   --  0.7  --  0.8  ?EOSABS  --   --  0.0  --  0.1  ?BASOSABS  --   --  0.0  --  0.0  ? ? ?Electrolytes ?Recent Labs  ?Lab 08/21/21 ?2354 08/22/21 ?0805 08/22/21 ?[id number] 08/23/21 ?0945 08/23/21 ?2339 08/23/21 ?2345 08/25/21 ?0148  ?NA 137  --  137 137  --  140 142  ?K 4.4  --  4.2 4.9  --  4.4 3.7  ?CL 103  --   --  104  --  107 111  ?CO2 26  --   --  24  --  25 23  ?GLUCOSE 115*  --   --  145*  --  107* 104*  ?BUN 32*  --   --  35*  --  35* 28*  ?CREATININE 1.14  --   --   1.29*  --  1.07 0.86  ?CALCIUM 8.8*  --   --  8.8*  --  8.6* 7.5*  ?AST  --  31  --  35  --   --  38  ?ALT  --  29  --  32  --   --  25  ?ALKPHOS  --  59  --  63  --   --  44  ?BILITOT  --  0.7  --  1.1  --   --  0.9  ?

## 2021-08-25 NOTE — Progress Notes (Signed)
Spoke with pt's primary contact/son, Minna Merritts, given update on pt's condition at bedside. Pt currently with stable vital signs, telemetry d/c'ed per Dr. Thedore Mins. Drowsy, however verbally responsive. No distress noted at this time. Per [name], call at anytime if pt's condition worsens. Will continue and update as appropriate.  ?

## 2021-08-25 NOTE — Progress Notes (Signed)
EEG complete - results pending 

## 2021-08-25 NOTE — NC FL2 (Signed)
?[place] MEDICAID FL2 LEVEL OF CARE SCREENING TOOL  ?  ? ?IDENTIFICATION  ?Patient Name: ?David Scott Birthdate: 1940/08/18 Sex: male Admission Date (Current Location): ?08/23/2021  ?[place] and [place] Number: ? [place] ?  Facility and Address:  ?[facility], [place] [address], [place], [place] [address] ?     Provider Number: ?[id number]  ?Attending Physician Name and Address:  ?Thurnell Lose, MD ? Relative Name and Phone Number:  ?[place], Nault (Son)   [phone] [facility]) ?   ?Current Level of Care: ?Hospital Recommended Level of Care: ?[place] Prior Approval Number: ?  ? ?Date Approved/Denied: ?  PASRR Number: ?[id number] ? ?Discharge Plan: ?SNF ?  ? ?Current Diagnoses: ?Patient Active Problem List  ? Diagnosis Date Noted  ? Acute metabolic encephalopathy [id number]  ? Pressure injury of skin 08/23/2021  ? Inguinal hernia 08/23/2021  ? Frequent falls 08/23/2021  ? Proximal humerus fracture 08/23/2021  ? Renal insufficiency 08/23/2021  ? Macrocytic anemia 08/23/2021  ? History of stroke with residual deficit 08/23/2021  ? Bradycardia 06/11/2021  ? Aortic atherosclerosis ([place]) 06/11/2021  ? Skin lesion 03/03/2021  ? Shortness of breath 03/03/2021  ? Supranuclear palsy ([place]) 05/10/2018  ? Stroke ([place]) 04/12/2017  ? Memory loss 02/03/2017  ? Gait disturbance 02/03/2017  ? Polyneuropathy 02/03/2017  ? Numbness 02/03/2017  ? ? ?Orientation RESPIRATION BLADDER Height & Weight   ?  ?Self ? Normal Incontinent, External catheter Weight: 124 lb 1.9 oz (56.3 kg) ?Height:  5\' 10"  (177.8 cm)  ?BEHAVIORAL SYMPTOMS/MOOD NEUROLOGICAL BOWEL NUTRITION STATUS  ?    Continent Diet (See d/c summary)  ?AMBULATORY STATUS COMMUNICATION OF NEEDS Skin   ?Extensive Assist Verbally PU Stage and Appropriate Care (Pressure injury, mid, sacrum, stage 2) ?  ?  ?  ?    ?     ?     ? ? ?Personal Care Assistance Level of Assistance  ?Bathing, Feeding, Dressing Bathing Assistance: Limited  assistance ?Feeding assistance: Independent ?Dressing Assistance: Limited assistance ?   ? ?Functional Limitations Info  ?Sight, Hearing, Speech Sight Info: Adequate ?Hearing Info: Adequate ?Speech Info: Adequate  ? ? ?SPECIAL CARE FACTORS FREQUENCY  ?PT (By licensed PT), OT (By licensed OT)   ?  ?PT Frequency: 5x/week ?OT Frequency: 5x/week ?  ?  ?  ?   ? ? ?Contractures Contractures Info: Not present  ? ? ?Additional Factors Info  ?Code Status, Allergies Code Status Info: DNR ?Allergies Info: Benzonatate, Tamsulosin Hcl, Septra (sulfamethoxazole-trimethoprim) ?  ?  ?  ?   ? ?Current Medications (08/25/2021):  This is the current hospital active medication list ?Current Facility-Administered Medications  ?Medication Dose Route Frequency Provider Last Rate Last Admin  ? (feeding supplement) PROSource Plus liquid 30 mL  30 mL Oral BID BM Thurnell Lose, MD      ? acetaminophen (TYLENOL) tablet 650 mg  650 mg Oral Q6H PRN Norval Morton, MD      ? Or  ? acetaminophen (TYLENOL) suppository 650 mg  650 mg Rectal Q6H PRN Fuller Plan A, MD      ? acidophilus (RISAQUAD) capsule 1 capsule  1 capsule Oral Daily Smith, Rondell A, MD      ? albuterol (PROVENTIL) (2.5 MG/3ML) 0.083% nebulizer solution 2.5 mg  2.5 mg Nebulization Q6H PRN Fuller Plan A, MD      ? aspirin EC tablet 81 mg  81 mg Oral QPC lunch Norval Morton, MD      ?  enoxaparin (LOVENOX) injection 40 mg  40 mg Subcutaneous Q24H Fuller Plan A, MD   40 mg at 08/24/21 2109  ? lactose free nutrition (BOOST PLUS) liquid 237 mL  237 mL Oral TID WC Thurnell Lose, MD      ? melatonin tablet 3 mg  3 mg Oral QHS Smith, Rondell A, MD      ? pantoprazole (PROTONIX) EC tablet 40 mg  40 mg Oral Daily Thurnell Lose, MD      ? polyethylene glycol (MIRALAX / GLYCOLAX) packet 17 g  17 g Oral Daily PRN Fuller Plan A, MD      ? polyvinyl alcohol (LIQUIFILM TEARS) 1.4 % ophthalmic solution   Both Eyes Daily Fuller Plan A, MD   Given at 08/24/21 1116  ?  sodium chloride flush (NS) 0.9 % injection 3 mL  3 mL Intravenous Q12H Smith, Rondell A, MD   3 mL at 08/24/21 2109  ? traMADol (ULTRAM) tablet 50 mg  50 mg Oral Q6H PRN Thurnell Lose, MD      ? ? ? ?Discharge Medications: ?Please see discharge summary for a list of discharge medications. ? ?Relevant Imaging Results: ? ?Relevant Lab Results: ? ? ?Additional Information ?SSN [id number] ? ?Lissa Morales Brenn Deziel, LCSW ? ? ? ? ?

## 2021-08-25 NOTE — Consult Note (Signed)
?Palliative Medicine Inpatient Consult Note ? ?Consulting Provider: Leroy Sea, MD ? ?Reason for consult:   ?Palliative Care Consult Services Palliative Medicine Consult  ?Reason for Consult? Extremely frail and cachectic gentleman, goals of care.  ? ? ?HPI:  ?Per intake H&P --> 81 y.o. 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 ? ?Palliative care has been asked to address goals of care in the setting of chronic diseases and an increased frailty. ? ?Clinical Assessment/Goals of Care: ? ?*Please note that this is a verbal dictation therefore any spelling or grammatical errors are due to the "Dragon Medical One" system interpretation. ? ?I have reviewed medical records including EPIC notes, labs and imaging, received report from bedside RN, assessed the patient who is lying in bed slow to respond though aware of person and place.  ?  ?I called patients son, [name] to further discuss diagnosis prognosis, GOC, EOL wishes, disposition and options. ?  ?I introduced Palliative Medicine as specialized medical care for people living with serious illness. It focuses on providing relief from the symptoms and stress of a serious illness. The goal is to improve quality of life for both the patient and the family. ? ?Medical History Review and Understanding: ? ?I reviewed with patients son, Shiva's history of stroke in 2018. We reviewed his high blood pressure which [name] keeps an eye on at home and had previously monitored with a home device. Discussed Derrien's present admission in the setting of dehydration which was been an ongoing battle at  home.  ? ?Social History: ? ?Maxey is from [place], [place]. He is a widower. He has four children all who live within state. He use to work for [employer]. He is an autonomous man. He has faith and practices within Christianity. ? ?Functional and Nutritional State: ? ?Prior to admission, Tobyn had been living with his son. He is able to mobilize in the home and had not used a walker though his son feels that would likely be a good idea in the future. [name] would help [place] up and down the steps as well as prepare meals otherwise Nitin was able to help himself. As far as hydration - this has been a tough thing to manage per [name] as Pradyun is not motivated to drink. ? ?Advance Directives: ? ?A detailed discussion was had today regarding advanced directives.  [name] shares that he will find and bring in a copy of these for review and to have scanned into [employer].  ? ?Code Status: ? ?Concepts specific to code status, artifical feeding and hydration, continued IV antibiotics and rehospitalization was had. [place] is an established DNAR/DNI.  ? ?Discussion: ? ?We reviewed that Guss presently has suffered a fracture of his right humerus. We discussed that often recoveries like this can be complicated for geriatric patients. We reviewed best case and worst case scenarios. I shared that in the worst case, [place] may neglect to eat and drink sufficiently as well as lack participation/improvements in rehab. Reviewed that should this be the case it would contribute to patient frailty and possibly decrease his length of life. We reviewed the  differences between outpatient Palliative support and hospice care. [name] expresses concerns about the long term ability to care for Oswaldo and the affordability of assisted living facilities. We reviewed that much of the future will be determined by Chuck's rehabilitation course though he presently does have many worrisome factors. ? ?For now, OP Palliative follow up  will be provided on discharge to support Jonathin and his son. ? ?Discussed the importance of continued conversation with family and their  medical providers regarding overall plan of care and treatment options, ensuring decisions are within the context of the patients values and GOCs. ? ?Decision Maker: ?Gradie Ohm (son) [phone] ? ?SUMMARY OF RECOMMENDATIONS   ?DNAR/DNI ? ?Requested a copy of Advance Directives ? ?Review of patient chronic conditions and best case vs. Worst case scenarios of recovery discussed ? ?TOC - OP Palliative Care ? ?Plan for SNF placement ? ?Ongoing Palliative support until discharge ? ?Code Status/Advance Care Planning: ?DNAR/DNI ?  ? ?Palliative Prophylaxis:  ?Aspiration, Bowel Regimen, Delirium Protocol, Frequent Pain Assessment, Oral Care, Palliative Wound Care, and Turn Reposition ? ?Additional Recommendations (Limitations, Scope, Preferences): ?Avoid Hospitalization, Full Scope Treatment, No Artificial Feeding, No Surgical Procedures, and No Tracheostomy ? ?Psycho-social/Spiritual:  ?Desire for further Chaplaincy support: Yes ?Additional Recommendations: Education on fractures in the elderly, healing time, and frailty ?  ?Prognosis: Will largely depend on patients rehabilitation journey.  ? ?Discharge Planning: Discharge to SNF once optimized. ? ?Vitals:  ? 08/25/21 0805 08/25/21 1140  ?BP: (!) 157/96 (!) 145/69  ?Pulse: 67 74  ?Resp: 19 20  ?Temp: 98.9 ?F (37.2 ?C) 98.9 ?F (37.2 ?C)  ?SpO2: 93% 93%  ? ? ?Intake/Output Summary (Last 24 hours) at 08/25/2021 1213 ?Last data filed at 08/25/2021 0700 ?Gross per 24 hour  ?Intake 436.67 ml  ?Output 1200 ml  ?Net -763.33 ml  ? ?Last Weight  Most recent update: 08/23/2021  9:07 AM  ? ? Weight  ?56.3 kg (124 lb 1.9 oz)  ?      ? ?  ? ?Gen: Elderly Frail caucasian M in NAD ?HEENT: moist mucous membranes ?CV: Regular rate and rhythm ?PULM: clear to auscultation bilaterally ?ABD: soft/nontender ?EXT: No edema ?Neuro: Alert and oriented x2 - slow  to respond ? ?PPS: ? ? ?This conversation/these recommendations were discussed with patient primary care team, Dr. Thedore Mins ? ? ?MDM High ? ?Medical Decision Making: ?#/Complex Problems:                      ?Data Reviewed:                 ?Management: ?(1-Straightforward, 2-Low, 3-Moderate, 4-High) ?______________________________________________________ ?Lamarr Lulas ?[place] Palliative Medicine Team ?Team Cell Phone: [phone] ?Please utilize secure chat with additional questions, if there is no response within 30 minutes please call the above phone number ? ?Palliative Medicine Team providers are available by phone from 7am to 7pm daily and can be reached through the team cell phone.  ?Should this patient require assistance outside of these hours, please call the patient's attending physician. ? ? ?

## 2021-08-25 NOTE — Progress Notes (Signed)
Speech Language Pathology Treatment: Dysphagia  ?Patient Details ?Name: David Scott ?MRN: [id number] ?DOB: [birth year] ?Today's Date: 08/25/2021 ?Time: 2536-6440 ?SLP Time Calculation (min) (ACUTE ONLY): 13 min ? ?Assessment / Plan / Recommendation ?Clinical Impression ? Pt still kept his eyes closed throughout this session despite cues to open them, but he was more awake and interactive compared to initial evaluation. He does not have the visual cues of food/drink coming towards him, and so he does need cues to get boluses into his oral cavity, especially when transitioning between consistencies. However, his oropharyngeal swallowing appears to occur with more automaticity today once boluses have reached his mouth. Given mentation, would stick with mechanical soft diet (diet changed from soft to mechanical soft to better accommodate softer textures) and thin liquids.  ?  ?HPI HPI: 81 y.o. male who presents 08/23/21 after having a fall and noted to be more altered. +rt proximal humeral fx; +dehydration; MRI brain and CXR negative.   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  ?   ?SLP Plan ? Continue with current plan of care ? ?  ?  ?Recommendations for follow up therapy are one component of a multi-disciplinary discharge planning process, led by the attending physician.  Recommendations may be updated based on patient status, additional functional criteria and insurance authorization. ?  ? ?Recommendations  ?Diet recommendations: Dysphagia 3 (mechanical soft);Thin liquid ?Liquids provided via: Cup;Straw ?Medication Administration: Crushed with puree ?Supervision: Staff to assist with self feeding;Full supervision/cueing for compensatory strategies ?Compensations: Minimize environmental distractions;Slow rate;Small sips/bites ?Postural Changes and/or Swallow Maneuvers: Seated upright 90 degrees  ?   ?    ?   ? ? ? ? Oral Care Recommendations: Oral  care QID ?Follow Up Recommendations: Skilled nursing-short term rehab (<3 hours/day) ?Assistance recommended at discharge: Frequent or constant Supervision/Assistance ?SLP Visit Diagnosis: Dysphagia, unspecified (R13.10) ?Plan: Continue with current plan of care ? ? ? ? ?  ?  ? ? ?Mahala Menghini., M.A. CCC-SLP ?Acute Rehabilitation Services ?Office [phone] ? ?Secure chat preferred ? ? ?08/25/2021, 9:58 AM ?

## 2021-08-26 DIAGNOSIS — I6930 Unspecified sequelae of cerebral infarction: Secondary | ICD-10-CM | POA: Diagnosis not present

## 2021-08-26 DIAGNOSIS — G9341 Metabolic encephalopathy: Secondary | ICD-10-CM | POA: Diagnosis not present

## 2021-08-26 DIAGNOSIS — E46 Unspecified protein-calorie malnutrition: Secondary | ICD-10-CM

## 2021-08-26 DIAGNOSIS — K4090 Unilateral inguinal hernia, without obstruction or gangrene, not specified as recurrent: Secondary | ICD-10-CM | POA: Diagnosis not present

## 2021-08-26 LAB — CBC WITH DIFFERENTIAL/PLATELET
Abs Immature Granulocytes: 0.02 10*3/uL (ref 0.00–0.07)
Basophils Absolute: 0 10*3/uL (ref 0.0–0.1)
Basophils Relative: 0 %
Eosinophils Absolute: 0.1 10*3/uL (ref 0.0–0.5)
Eosinophils Relative: 1 %
HCT: 28.3 % — ABNORMAL LOW (ref 39.0–52.0)
Hemoglobin: 9.3 g/dL — ABNORMAL LOW (ref 13.0–17.0)
Immature Granulocytes: 0 %
Lymphocytes Relative: 10 %
Lymphs Abs: 0.7 10*3/uL (ref 0.7–4.0)
MCH: 32.9 pg (ref 26.0–34.0)
MCHC: 32.9 g/dL (ref 30.0–36.0)
MCV: 100 fL (ref 80.0–100.0)
Monocytes Absolute: 0.7 10*3/uL (ref 0.1–1.0)
Monocytes Relative: 10 %
Neutro Abs: 5.3 10*3/uL (ref 1.7–7.7)
Neutrophils Relative %: 79 %
Platelets: 159 10*3/uL (ref 150–400)
RBC: 2.83 MIL/uL — ABNORMAL LOW (ref 4.22–5.81)
RDW: 13 % (ref 11.5–15.5)
WBC: 6.8 10*3/uL (ref 4.0–10.5)
nRBC: 0 % (ref 0.0–0.2)

## 2021-08-26 LAB — COMPREHENSIVE METABOLIC PANEL
ALT: 25 U/L (ref 0–44)
AST: 33 U/L (ref 15–41)
Albumin: 2.6 g/dL — ABNORMAL LOW (ref 3.5–5.0)
Alkaline Phosphatase: 46 U/L (ref 38–126)
Anion gap: 9 (ref 5–15)
BUN: 27 mg/dL — ABNORMAL HIGH (ref 8–23)
CO2: 26 mmol/L (ref 22–32)
Calcium: 8.5 mg/dL — ABNORMAL LOW (ref 8.9–10.3)
Chloride: 109 mmol/L (ref 98–111)
Creatinine, Ser: 0.91 mg/dL (ref 0.61–1.24)
GFR, Estimated: >60 mL/min (ref >60)
Glucose, Bld: 98 mg/dL (ref 70–99)
Potassium: 3.8 mmol/L (ref 3.5–5.1)
Sodium: 144 mmol/L (ref 135–145)
Total Bilirubin: 1.5 mg/dL — ABNORMAL HIGH (ref 0.3–1.2)
Total Protein: 5 g/dL — ABNORMAL LOW (ref 6.5–8.1)

## 2021-08-26 LAB — BRAIN NATRIURETIC PEPTIDE: B Natriuretic Peptide: 684.5 pg/mL — ABNORMAL HIGH (ref 0.0–100.0)

## 2021-08-26 LAB — VITAMIN D 25 HYDROXY (VIT D DEFICIENCY, FRACTURES): Vit D, 25-Hydroxy: 55.44 ng/mL (ref 30–100)

## 2021-08-26 LAB — MAGNESIUM: Magnesium: 1.7 mg/dL (ref 1.7–2.4)

## 2021-08-26 LAB — PHOSPHORUS: Phosphorus: 3.3 mg/dL (ref 2.5–4.6)

## 2021-08-26 MED ORDER — THIAMINE HCL 100 MG/ML IJ SOLN
500.0000 mg | Freq: Three times a day (TID) | INTRAVENOUS | Status: DC
  Administered 2021-08-26 – 2021-08-27 (×5): 500 mg via INTRAVENOUS
  Filled 2021-08-26 (×11): qty 5

## 2021-08-26 MED ORDER — ADULT MULTIVITAMIN LIQUID CH
15.0000 mL | Freq: Every day | ORAL | Status: DC
  Filled 2021-08-26 (×3): qty 15

## 2021-08-26 MED ORDER — ADULT MULTIVITAMIN W/MINERALS CH: 1.0000 | ORAL_TABLET | Freq: Every day | ORAL | Status: DC

## 2021-08-26 NOTE — Progress Notes (Signed)
OT Cancellation Note ? ?Patient Details ?Name: David Scott ?MRN: [id number] ?DOB: 04-30-1940 ? ? ?Cancelled Treatment:    Reason Eval/Treat Not Completed: Medical issues which prohibited therapy. Per RN, pt with drop in HR and BP over night, may be transitioning to comfort care.  ? ?Malka So ?08/26/2021, 10:08 AM ?Nestor Lewandowsky, OTR/L ?Acute Rehabilitation Services ?Pager: [phone] ?Office: [phone]  ?

## 2021-08-26 NOTE — Care Management Important Message (Signed)
Important Message ? ?Patient Details  ?Name: David Scott ?MRN: [id number] ?Date of Birth: 1940-12-27 ? ? ?Medicare Important Message Given:  Yes ? ? ? ? ?Maribeth Jiles ?08/26/2021, 3:52 PM ?

## 2021-08-26 NOTE — Hospital Course (Signed)
Decreases appetite  ?Memory has been hit or miss  ?No behavior  ?States that shoulder pain has been managable  ?

## 2021-08-26 NOTE — Consult Note (Addendum)
NEUROLOGY CONSULTATION NOTE  ? ?Date of service: Aug 26, 2021 ?Patient Name: David Scott ?MRN:  [id number] ?DOB:  14-Oct-1940 ?Reason for consult: "Altered Mental Status" ?_ _ _   _ __   _ __ _ _  __ __   _ __   __ _ ? ?History of Present Illness  ?David Scott is a 81 y.o. male with PMH significant for  has a past medical history of Aortic atherosclerosis ([place]) (06/11/2021), Bradycardia (06/11/2021), CVA (cerebral vascular accident) [facility]), Esophageal reflux, Leg fracture, Shortness of breath (03/03/2021), and Skin lesion (03/03/2021). who presents with to [facility] on 08/23/21 after a fall with AMS.  ? ?Patient was in his usual steat of health until a recent fall with resulting right humeral fracture on 04/28. The patient was seen at [facility] ER and discharged with follow up with OP orthopedics. The next morning the patient was found down at home and confused. He subsequently was admitted to the hospital for acute encephalopathy. At baseline the patient is communicative and able to perform all of his ADL independently. He does have some occasional memory difficulties but without previous complications. The patient's son states that he has not had any change in behavior (depression or anxiety) or significant cognitive decline. He denies new medications or illicit drugs. His appetite has been relatively normal prior to his hospitalization. Patient does have a history of CVA with residual left-sided weakness. Repeat brain MRI during this admission did not show any new infarcts. He does have some frontal encephalomalacia. EEG shows triphasic wave indicative of toxic/metabolic encephalopathy.  ? ?On evaluation today, the patient remain encephalopathic but is oriented to himself and where he lives only. He is able to answer simple questions and move all of his extremities on command. He denies any pain.  He states that he is very thirsty.  ?  ?ROS  ? ?Constitutional Denies weight loss, fever and chills.   ?HEENT Denies changes in  vision and hearing.   ?Respiratory Denies SOB and cough.   ?CV Denies palpitations and CP   ?GI Denies abdominal pain, nausea, vomiting and diarrhea.   ?GU Denies dysuria and urinary frequency.   ?MSK Denies myalgia and joint pain.   ?Skin Denies rash and pruritus.   ?Neurological Denies headache and syncope.   ?Psychiatric Denies recent changes in mood. Denies anxiety and depression.   ? ?Past History  ? ?Past Medical History:  ?Diagnosis Date  ? Aortic atherosclerosis ([place]) 06/11/2021  ? Bradycardia 06/11/2021  ? CVA (cerebral vascular accident) [facility])   ? Esophageal reflux   ? Leg fracture   ? Shortness of breath 03/03/2021  ? Skin lesion 03/03/2021  ? ?History reviewed. No pertinent surgical history. ?Family History  ?Problem Relation Age of Onset  ? COPD Father   ? ?Social History  ? ?Socioeconomic History  ? Marital status: Single  ?  Spouse name: Not on file  ? Number of children: Not on file  ? Years of education: Not on file  ? Highest education level: Not on file  ?Occupational History  ? Not on file  ?Tobacco Use  ? Smoking status: Never  ? Smokeless tobacco: Never  ?Substance and Sexual Activity  ? Alcohol use: No  ? Drug use: No  ? Sexual activity: Not on file  ?Other Topics Concern  ? Not on file  ?Social History Narrative  ? Not on file  ? ?Social Determinants of Health  ? ?Financial Resource Strain: Not on file  ?Food  Insecurity: Not on file  ?Transportation Needs: Not on file  ?Physical Activity: Not on file  ?Stress: Not on file  ?Social Connections: Not on file  ? ?Allergies  ?Allergen Reactions  ? Benzonatate   ?  Other reaction(s): vivid dreams  ? Tamsulosin Hcl   ?  Other reaction(s): dizzy/achy  ? Septra [Sulfamethoxazole-Trimethoprim] Rash  ?  Pt states MD had him taking Sulfa by the handful when he had prostate infection, says they don't know what caused the rash  ? ? ?Medications  ? ?Medications Prior to Admission  ?Medication Sig Dispense Refill Last Dose  ? albuterol (PROVENTIL HFA;VENTOLIN  HFA) 108 (90 Base) MCG/ACT inhaler Inhale 2 puffs into the lungs every 6 (six) hours as needed for wheezing or shortness of breath.    08/22/2021  ? aspirin 81 MG tablet Take 81 mg by mouth daily after lunch.    08/14/2021  ? Cholecalciferol (VITAMIN D-3 PO) Take 1 capsule by mouth daily after lunch.    08/21/2021  ? furosemide (LASIX) 20 MG tablet Take 1/2 tablet daily OK to take an extra 1/2 tablet as needed for swelling or shortness of breath (Patient taking differently: Take 10 mg by mouth daily. Take an extra 1/2 tablet as needed for swelling or shortness of breath) 30 tablet 3 08/21/2021  ? Lactobacillus (PROBIOTIC ACIDOPHILUS PO) Take 1 capsule by mouth daily.   08/21/2021  ? Magnesium 250 MG TABS Take 250 mg by mouth daily.   08/21/2021  ? melatonin 3 MG TABS tablet Take 3 mg by mouth at bedtime.   08/21/2021  ? Multiple Vitamin (MULTIVITAMIN) tablet Take 1 tablet by mouth daily.   08/21/2021  ? polyethylene glycol powder (GLYCOLAX/MIRALAX) 17 GM/SCOOP powder Take 17 g by mouth daily for 7 doses. (Patient taking differently: Take 17 g by mouth daily as needed for moderate constipation.) 225 g 0 Past Month  ? Propylene Glycol (SYSTANE BALANCE OP) Place 1 drop into both eyes daily.   08/21/2021  ? vitamin C (ASCORBIC ACID) 500 MG tablet Take 1,000 mg by mouth daily.   08/21/2021  ? acetaminophen (TYLENOL) 500 MG tablet Take 1 tablet (500 mg total) by mouth every 6 (six) hours as needed. (Patient not taking: Reported on 08/23/2021) 30 tablet 0 Not Taking  ? HYDROcodone-acetaminophen (NORCO/VICODIN) 5-325 MG tablet Take 1 tablet by mouth every 8 (eight) hours as needed. (Patient not taking: Reported on 08/23/2021) 6 tablet 0 Not Taking  ?  ? ?Vitals  ? ?Vitals:  ? 08/25/21 1921 08/25/21 2314 08/26/21 0303 08/26/21 0751  ?BP: (!) 153/86 (!) 168/67 (!) 148/62   ?Pulse: (!) 55 60 62 66  ?Resp: 20 17 15 18   ?Temp: 97.8 ?F (36.6 ?C) 97.6 ?F (36.4 ?C) 98 ?F (36.7 ?C) 98.3 ?F (36.8 ?C)  ?TempSrc: Axillary Oral Axillary Axillary   ?SpO2: 97% 95% 94% 93%  ?Weight:      ?Height:      ?  ? ?Body mass index is 17.81 kg/m?. ? ?Physical Exam  ? ?General: Laying comfortably in bed; in no acute distress.  ?HENT: Normal oropharynx and mucosa. Normal external appearance of ears and nose.  ?Neck: Supple, no pain or tenderness  ?CV: RRR. No peripheral edema.  ?Pulmonary: Symmetric Chest rise. Normal respiratory effort.  ?Abdomen: Soft to touch, non-tender.  ?Ext: No cyanosis, edema, or deformity  ?Skin: No rash. Normal palpation of skin.   ?Musculoskeletal: Normal digits and nails by inspection. No clubbing.  ? ?Neurologic Examination  ?Mental status/Cognition:  Alert, sleepy/slowed reaction,  oriented to self, place (state/ city), poor attention ?Speech/language: Fluent, comprehension intact ?Cranial nerves:  ? CN II Pupils equal and reactive to light, no VF deficits   ? CN III,IV,VI EOM intact, no gaze preference or deviation, no nystagmus   ? CN V normal sensation in V1, V2, and V3 segments bilaterally   ? CN VII no asymmetry, no nasolabial fold flattening   ? CN VIII normal hearing to speech   ? CN IX & X normal palatal elevation, no uvular deviation   ? CN XI 3/5 head turn and 5/5 shoulder shrug bilaterally   ? CN XII midline tongue protrusion   ? ?Motor:  ?Patient has generalized weakness with possibly more on the left. Evidence of temporal muscle wasting. No focal weakness.  ? ? ? ?Sensation: ?No loss of sensation.  ? ?Labs  ? ?CBC:  ?Recent Labs  ?Lab 08/25/21 ?0148 08/26/21 ?[id number]  ?WBC 7.1 6.8  ?NEUTROABS 5.5 5.3  ?HGB 9.6* 9.3*  ?HCT 28.3* 28.3*  ?MCV 101.4* 100.0  ?PLT 145* 159  ? ? ?Basic Metabolic Panel:  ?Lab Results  ?Component Value Date  ? NA 144 08/26/2021  ? K 3.8 08/26/2021  ? CO2 26 08/26/2021  ? GLUCOSE 98 08/26/2021  ? BUN 27 (H) 08/26/2021  ? CREATININE 0.91 08/26/2021  ? CALCIUM 8.5 (L) 08/26/2021  ? GFRNONAA >60 08/26/2021  ? GFRAA >60 02/28/2018  ? ?Lipid Panel: No results found for: Many ?HgbA1c: No results found for:  HGBA1C ?Urine Drug Screen:  ?   ?Component Value Date/Time  ? LABOPIA NONE DETECTED 08/09/2015 1303  ? COCAINSCRNUR NONE DETECTED 08/09/2015 1303  ? LABBENZ NONE DETECTED 08/09/2015 1303  ? AMPHETMU NONE DETECTE

## 2021-08-26 NOTE — Progress Notes (Addendum)
?                                  PROGRESS NOTE                                             ?                                                                                                                     ?                                         ? ? Patient Demographics:  ? ? David Scott, is a 81 y.o. male, DOB - 06/03/1940, [id number] ? ?Outpatient Primary MD for the patient is Maury Dus, MD    LOS - 3  Admit date - 08/23/2021   ? ?Chief Complaint  ?Patient presents with  ? Altered Mental Status  ?    ? ?Brief Narrative (HPI from H&P)  ?- 81 y.o. 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 ? ? Subjective:  ? ?No significant events overnight as discussed with staff, he remains frail, very poor appetite, he did refuse some of his meds today. . ? ? Patient in bed, appears comfortable, denies any headache, no fever, no chest pain or pressure, no shortness of breath , no abdominal pain. No new focal weakness. ? ? Assessment  & Plan :  ? ?Acute metabolic encephalopathy in the presence of severe dehydration, AKI and severe protein calorie malnutrition.  ?- he appears extremely frail and cachectic, CT scan of the head done over the last 2 days 2 different studies remains nonacute and MRI brain, he has no focal deficits. ?-EEG done 5/2, with triphasic phase, but no evidence of seizure activity. ?-Regimen consulted regarding further recommendations, findings appears to be delirium, failure to thrive, no evidence of seizures. ?-Vitamin D within normal limit ?-Follow on vitamin A and B1. ?-Continue  with high-dose IV thiamine as discussed with neurology ? ?Failure to thrive/deconditioning/severe PCM ?-Continue to have poor appetite, confusion, to thrive, cachectic, palliative medicine consulted, he is DNR, and for outpatient follow-up. ?-cONT with PT/OT. ?- Generalized weakness, severe deconditioning with recent fall with proximal right humerus fracture present on admission.  Arm in sling, as per orthopedics outpatient follow-up, this was during previous hospital visit  few days prior to this admission. ?-Patient with severe PCM, with BMI of 17.8, continue with supplements ? ?AKI.  Hydrate and monitor. ? ?Chronic right-sided inguinal hernia.  Recent CT imaging shows it contains bowel but without any incarceration, nontender abdomen.  We will continue to monitor. ? ?History of stroke with minimal left-sided weakness on a chronic basis.  Supportive care.  Continue aspirin for secondary prevention. ? ?recent fall with proximal right humerus fracture present on admission.   ?-Arm in sling, as per orthopedics outpatient follow-up, this was during previous hospital visit few days prior to this admission. ? ?Chronic macrocytic anemia.   ?-Stable TSH and B12, outpatient PCP to monitor. ? ?Intermittent myoclonic jerks noted.  ?-EEG with no evidence of seizure activities  ? ?   ? ?Condition - Extremely Guarded ? ?Family Communication  : none at bedside ? ?Code Status :  DNR ? ?Consults  :  [place], Neurology ? ?PUD Prophylaxis :  ? ? Procedures  :    ? ?EEG ordered 08/25/2021.   ? ?CT Abd  - Pelvis - 1. No acute findings in the abdomen or pelvis. 2. Large right groin hernia contains small bowel and colon without evidence for overt obstruction. 3. Left colonic diverticulosis without diverticulitis. 4. Prostatomegaly with evidence of median lobe hypertrophy ? ?MRI brain.    No evidence of recent infarction, hemorrhage, or mass. Similar chronic/nonemergent findings detailed above. ? ?CT  Head x 2  -  Atrophy with small  vessel chronic ischemic changes of deep cerebral white matter. No acute intracranial abnormalities. Multilevel degenerative disc and facet disease changes of the cervical spine. 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 No acute cervical spine abnormalities. ? ?   ? ?Disposition Plan  :   ? ?Status is: Inpatient ? ?DVT Prophylaxis  :   ? ?Place TED hose Start: 08/24/21 1024 ?enoxaparin (LOVENOX) injection 40 mg Start: 08/23/21 2200 ? ?Lab Results  ?Component Value Date  ? PLT 159 08/26/2021  ? ? ?Diet :  ?Diet Order   ? ?       ?  DIET DYS 3 Room service appropriate? Yes; Fluid consistency: Thin  Diet effective now       ?  ? ?  ?  ? ?  ?  ? ?Inpatient Medications ? ?Scheduled Meds: ? (feeding supplement) PROSource Plus  30 mL Oral BID BM  ? acidophilus  1 capsule Oral Daily  ? aspirin EC  81 mg Oral QPC lunch  ? enoxaparin (LOVENOX) injection  40 mg Subcutaneous Q24H  ? lactose free nutrition  237 mL Oral TID WC  ? melatonin  3 mg Oral QHS  ? pantoprazole  40 mg Oral Daily  ? polyvinyl alcohol   Both Eyes Daily  ? sodium chloride flush  3 mL Intravenous Q12H  ? ?Continuous Infusions: ? thiamine injection 500 mg (08/26/21 1029)  ? ?PRN Meds:.acetaminophen **OR** acetaminophen, albuterol, polyethylene glycol, traMADol ? ?Antibiotics  :   ? ?Anti-infectives (From admission, onward)  ? ? None  ? ?  ? ? ? Time Spent in minutes  30 ? ? ?Phillips Climes M.D on 08/26/2021 at 2:34 PM ? ?To page go to [email or web address]  ? ?[facility] -  Office  [phone] ? ?See all Orders from today for further details ? ? ? Objective:  ? ?Vitals:  ? 08/25/21 2314 08/26/21 0303 08/26/21 0751 08/26/21 1158  ?BP: (!) 168/67 (!) 148/62  (!) 163/76  ?Pulse: 60  62 66 90  ?Resp: 17 15 18 20   ?Temp: 97.6 ?F (36.4 ?C) 98 ?F (36.7 ?C) 98.3 ?F (36.8 ?C) 98.7 ?F (37.1 ?C)  ?TempSrc: Oral Axillary Axillary Axillary  ?SpO2: 95% 94% 93% 95%  ?Weight:      ?Height:      ? ? ?Wt Readings from Last 3  Encounters:  ?08/23/21 56.3 kg  ?06/11/21 56.3 kg  ?03/03/21 55.7 kg  ? ? ? ?Intake/Output Summary (Last 24 hours) at 08/26/2021 1435 ?Last data filed at 08/25/2021 1833 ?Gross per 24 hour  ?Intake --  ?Output 350 ml  ?Net -350 ml  ? ? ? ? ?Physical Exam ? ?Somnolent, extremely frail, chronically ill-appearing, in no apparent distress, deconditioned ?Symmetrical Chest wall movement, Good air movement bilaterally, CTAB ?RRR,No Gallops,Rubs or new Murmurs, No Parasternal Heave ?+ve B.Sounds, Abd Soft, No tenderness, No rebound - guarding or rigidity. ?No Cyanosis, Clubbing or edema, No new Rash or bruise   ? ?  ? ?RN pressure injury documentation: ?Pressure Injury 08/23/21 Sacrum Mid Stage 2 -  Partial thickness loss of dermis presenting as a shallow open injury with a red, pink wound bed without slough. pink bedding (Active)  ?08/23/21 0933  ?Location: Sacrum  ?Location Orientation: Mid  ?Staging: Stage 2 -  Partial thickness loss of dermis presenting as a shallow open injury with a red, pink wound bed without slough.  ?Wound Description (Comments): pink bedding  ?Present on Admission: Yes  ?Dressing Type Foam - Lift dressing to assess site every shift 08/25/21 2207  ? ? ? Data Review:  ? ? ?CBC ?Recent Labs  ?Lab 08/21/21 ?2354 08/22/21 ?[id number] 08/23/21 ?0945 08/23/21 ?2345 08/25/21 ?0148 08/26/21 ?[id number]  ?WBC 3.9*  --  10.6* 8.0 7.1 6.8  ?HGB 12.7* 14.6 11.9* 10.7* 9.6* 9.3*  ?HCT 37.5* 43.0 36.1* 32.7* 28.3* 28.3*  ?PLT 190  --  190 176 145* 159  ?MCV 100.5*  --  102.0* 100.3* 101.4* 100.0  ?MCH 34.0  --  33.6 32.8 34.4* 32.9  ?MCHC 33.9  --  33.0 32.7 33.9 32.9  ?RDW 13.2  --  13.1 13.2 13.2 13.0  ?LYMPHSABS  --   --  0.4*  --  0.7 0.7  ?MONOABS  --   --  0.7  --  0.8 0.7  ?EOSABS  --   --  0.0  --  0.1 0.1  ?BASOSABS  --   --  0.0  --  0.0 0.0  ? ? ? ?Electrolytes ?Recent Labs  ?Lab 08/21/21 ?2354 08/22/21 ?0805 08/22/21 ?[id number] 08/23/21 ?0945 08/23/21 ?2339 08/23/21 ?2345 08/25/21 ?0148 08/26/21 ?[id number]  ?NA 137  --  137  137  --  140 142 144  ?K 4.4  --  4.2 4.9  --  4.4 3.7 3.8  ?CL 103  --   --  104  --  107 111 109  ?CO2 26  --   --  24  --  25 23 26   ?GLUCOSE 115*  --   --  145*  --  107* 104* 98  ?BUN 32*  --   --  35*  --  3

## 2021-08-26 NOTE — Progress Notes (Signed)
PT Cancellation Note ? ?Patient Details ?Name: David Scott ?MRN: [id number] ?DOB: Mar 11, 1941 ? ? ?Cancelled Treatment:    Reason Eval/Treat Not Completed: Medical issues which prohibited therapy ? ?Per RN, pt had a decline overnight with HR and BP dropping. RN states they are possibly moving to comfort care and recommended PT defer treatment at this time.  ? ? ?[name], PT ?Acute Rehabilitation Services  ?Pager [phone] ?Office [phone] ? ?[name] ?08/26/2021, 8:39 AM ?

## 2021-08-27 DIAGNOSIS — S42294D Other nondisplaced fracture of upper end of right humerus, subsequent encounter for fracture with routine healing: Secondary | ICD-10-CM | POA: Diagnosis not present

## 2021-08-27 LAB — COMPREHENSIVE METABOLIC PANEL
ALT: 23 U/L (ref 0–44)
AST: 24 U/L (ref 15–41)
Albumin: 2.4 g/dL — ABNORMAL LOW (ref 3.5–5.0)
Alkaline Phosphatase: 44 U/L (ref 38–126)
Anion gap: 8 (ref 5–15)
BUN: 25 mg/dL — ABNORMAL HIGH (ref 8–23)
CO2: 28 mmol/L (ref 22–32)
Calcium: 8.3 mg/dL — ABNORMAL LOW (ref 8.9–10.3)
Chloride: 105 mmol/L (ref 98–111)
Creatinine, Ser: 0.87 mg/dL (ref 0.61–1.24)
GFR, Estimated: >60 mL/min (ref >60)
Glucose, Bld: 107 mg/dL — ABNORMAL HIGH (ref 70–99)
Potassium: 3.7 mmol/L (ref 3.5–5.1)
Sodium: 141 mmol/L (ref 135–145)
Total Bilirubin: 1.2 mg/dL (ref 0.3–1.2)
Total Protein: 5 g/dL — ABNORMAL LOW (ref 6.5–8.1)

## 2021-08-27 LAB — BRAIN NATRIURETIC PEPTIDE: B Natriuretic Peptide: 627.2 pg/mL — ABNORMAL HIGH (ref 0.0–100.0)

## 2021-08-27 LAB — CBC WITH DIFFERENTIAL/PLATELET
Abs Immature Granulocytes: 0.02 10*3/uL (ref 0.00–0.07)
Basophils Absolute: 0 10*3/uL (ref 0.0–0.1)
Basophils Relative: 0 %
Eosinophils Absolute: 0.2 10*3/uL (ref 0.0–0.5)
Eosinophils Relative: 3 %
HCT: 28 % — ABNORMAL LOW (ref 39.0–52.0)
Hemoglobin: 9.3 g/dL — ABNORMAL LOW (ref 13.0–17.0)
Immature Granulocytes: 0 %
Lymphocytes Relative: 11 %
Lymphs Abs: 0.6 10*3/uL — ABNORMAL LOW (ref 0.7–4.0)
MCH: 33.2 pg (ref 26.0–34.0)
MCHC: 33.2 g/dL (ref 30.0–36.0)
MCV: 100 fL (ref 80.0–100.0)
Monocytes Absolute: 0.7 10*3/uL (ref 0.1–1.0)
Monocytes Relative: 11 %
Neutro Abs: 4.6 10*3/uL (ref 1.7–7.7)
Neutrophils Relative %: 75 %
Platelets: 167 10*3/uL (ref 150–400)
RBC: 2.8 MIL/uL — ABNORMAL LOW (ref 4.22–5.81)
RDW: 12.9 % (ref 11.5–15.5)
WBC: 6.1 10*3/uL (ref 4.0–10.5)
nRBC: 0 % (ref 0.0–0.2)

## 2021-08-27 LAB — MAGNESIUM: Magnesium: 1.8 mg/dL (ref 1.7–2.4)

## 2021-08-27 LAB — PHOSPHORUS: Phosphorus: 3.5 mg/dL (ref 2.5–4.6)

## 2021-08-27 LAB — FOLATE: Folate: 24.4 ng/mL (ref >5.9)

## 2021-08-27 MED ORDER — MIRTAZAPINE 15 MG PO TBDP
15.0000 mg | ORAL_TABLET | Freq: Every day | ORAL | Status: DC
  Filled 2021-08-27 (×3): qty 1

## 2021-08-27 MED ORDER — ENSURE ENLIVE PO LIQD
237.0000 mL | Freq: Two times a day (BID) | ORAL | Status: DC
  Administered 2021-08-27: 237 mL via ORAL

## 2021-08-27 NOTE — TOC Progression Note (Signed)
Transition of Care (TOC) - Progression Note  ? ? ?Patient Details  ?Name: David Scott ?MRN: [id number] ?Date of Birth: 1941/01/26 ? ?Transition of Care (TOC) CM/SW Contact  ?Mearl Latin, LCSW ?Phone Number: ?08/27/2021, 12:31 PM ? ?Clinical Narrative:    ?CSW spoke with patient's son and made him aware that patient will likely be medically ready to discharge tomorrow. He is currently going over SNF options and will call CSW back with choice.  ? ? ?Expected Discharge Plan: Skilled Nursing Facility ?Barriers to Discharge: English as a second language teacher, Continued Medical Work up, SNF Pending bed offer ? ?Expected Discharge Plan and Services ?Expected Discharge Plan: Skilled Nursing Facility ?  ?  ?  ?Living arrangements for the past 2 months: Single Family Home ?                ?  ?  ?  ?  ?  ?  ?  ?  ?  ?  ? ? ?Social Determinants of Health (SDOH) Interventions ?  ? ?Readmission Risk Interventions ?   ? View : No data to display.  ?  ?  ?  ? ? ?

## 2021-08-27 NOTE — Progress Notes (Signed)
?                                  PROGRESS NOTE                                             ?                                                                                                                     ?                                         ? ? Patient Demographics:  ? ? David Scott, is a 81 y.o. male, DOB - 11-06-1940, PI:[id number] ? ?Outpatient Primary MD for the patient is Maury Dus, MD    LOS - 4  Admit date - 08/23/2021   ? ?Chief Complaint  ?Patient presents with  ? Altered Mental Status  ?    ? ?Brief Narrative (HPI from H&P)  ?- 81 y.o. 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 ? ? Subjective:  ? ?No significant events overnight as discussed with staff, he himself denies any complaints. ? ? Assessment  & Plan :  ? ?Acute metabolic encephalopathy in the presence of severe dehydration, AKI and severe protein calorie malnutrition.  ?- he appears extremely frail and cachectic, CT scan of the head done over the last 2 days 2 different studies remains nonacute and MRI brain, he has no focal deficits. ?-EEG done 5/2, with triphasic phase, but no evidence of seizure activity. ?-Regimen consulted regarding further recommendations, findings appears to be delirium, failure to thrive, no evidence of seizures. ?-Vitamin D within normal limit ?-Follow on vitamin A and B1. ?-Continue with high-dose IV thiamine as discussed with neurology ? ? ?Failure to thrive/deconditioning/severe PCM ?-Continue to have poor appetite, confusion, to thrive, cachectic, palliative medicine consulted, he is DNR,  and for outpatient follow-up. ?-cONT with PT/OT. ?- Generalized weakness, severe deconditioning with recent fall with proximal right humerus fracture present on admission.  Arm in sling, as per orthopedics outpatient follow-up, this was during previous hospital visit few days prior to this admission. ?-Patient with severe PCM, with BMI of 17.8, continue with supplements ?-Started on mirtazapine to help with appetite ? ?AKI.  Hydrate and monitor. ? ?Chronic right-sided inguinal hernia.  Recent CT  imaging shows it contains bowel but without any incarceration, nontender abdomen.  We will continue to monitor. ? ?History of stroke with minimal left-sided weakness on a chronic basis.  Supportive care.  Continue aspirin for secondary prevention. ? ?recent fall with proximal right humerus fracture present on admission.   ?-Arm in sling, as per orthopedics outpatient follow-up, this was during previous hospital visit few days prior to this admission. ? ?Chronic macrocytic anemia.   ?-Stable TSH and B12, outpatient PCP to monitor. ? ?Intermittent myoclonic jerks noted.  ?-EEG with no evidence of seizure activities  ? ?   ? ?Condition - Extremely Guarded ? ?Family Communication  : D/W son Fara Olden [employer]. ? ?Code Status :  DNR ? ?Consults  :  [facility] [facility], Neurology ? ?PUD Prophylaxis :  ? ? Procedures  :    ? ?EEG ordered 08/25/2021.   ? ?CT Abd  - Pelvis - 1. No acute findings in the abdomen or pelvis. 2. Large right groin hernia contains small bowel and colon without evidence for overt obstruction. 3. Left colonic diverticulosis without diverticulitis. 4. Prostatomegaly with evidence of median lobe hypertrophy ? ?MRI brain.    No evidence of recent infarction, hemorrhage, or mass. Similar chronic/nonemergent findings detailed above. ? ?CT  Head x 2  -  Atrophy with small vessel chronic ischemic changes of deep cerebral white matter. No acute intracranial abnormalities. Multilevel degenerative disc and facet disease changes of the  cervical spine. 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 No acute cervical spine abnormalities. ? ?   ? ?Disposition Plan  :   ? ?Status is: Inpatient ? ?DVT Prophylaxis  :   ? ?Place TED hose Start: 08/24/21 1024 ?enoxaparin (LOVENOX) injection 40 mg Start: 08/23/21 2200 ? ?Lab Results  ?Component Value Date  ? PLT 167 08/27/2021  ? ? ?Diet :  ?Diet Order   ? ?       ?  DIET DYS 3 Room service appropriate? Yes; Fluid consistency: Thin  Diet effective now       ?  ? ?  ?  ? ?  ?  ? ?Inpatient Medications ? ?Scheduled Meds: ? (feeding supplement) PROSource Plus  30 mL Oral BID BM  ? acidophilus  1 capsule Oral Daily  ? aspirin EC  81 mg Oral QPC lunch  ? enoxaparin (LOVENOX) injection  40 mg Subcutaneous Q24H  ? lactose free nutrition  237 mL Oral TID WC  ? melatonin  3 mg Oral QHS  ? multivitamin  15 mL Oral Daily  ? pantoprazole  40 mg Oral Daily  ? polyvinyl alcohol   Both Eyes Daily  ? sodium chloride flush  3 mL Intravenous Q12H  ? ?Continuous Infusions: ? thiamine injection 500 mg (08/27/21 0543)  ? ?PRN Meds:.acetaminophen **OR** acetaminophen, albuterol, polyethylene glycol, traMADol ? ?Antibiotics  :   ? ?Anti-infectives (From admission, onward)  ? ? None  ? ?  ? ? ? Time Spent in minutes  30 ? ? ?Phillips Climes M.D on 08/27/2021 at 3:03 PM ? ?To page go to [email or web address]  ? ?[facility] -  Office  [phone] ? ?See all Orders from today for further details ? ? ? Objective:  ? ?Vitals:  ? 08/26/21 2329 08/27/21 0310 08/27/21 0724 08/27/21 1132  ?BP: (!) 189/91 137/63 140/83 (!) 122/91  ?Pulse: 63 79 66 68  ?Resp: 18 16 18 18   ?Temp: 98.4 ?F (36.9 ?C) 98.1 ?F (36.7 ?C) 98.6 ?F (37 ?  C) 99.4 ?F (37.4 ?C)  ?TempSrc: Axillary Axillary Axillary Oral  ?SpO2: 94% 95% 96% 90%  ?Weight:      ?Height:      ? ? ?Wt Readings from Last 3 Encounters:  ?08/23/21 56.3 kg  ?06/11/21 56.3 kg  ?03/03/21 55.7 kg  ? ? ? ?Intake/Output Summary (Last 24 hours) at 08/27/2021  1503 ?Last data filed at 08/27/2021 1051 ?Gross per 24 hour  ?Intake 960 ml  ?Output --  ?Net 960 ml  ? ? ? ? ?Physical Exam ? ?Awake,  extremely frail, chronically ill-appearing, in no apparent distress, deconditioned ?Symmetrical Chest wall movement, Good air movement bilaterally, CTAB ?RRR,No Gallops,Rubs or new Murmurs, No Parasternal Heave ?+ve B.Sounds, Abd Soft, No tenderness, No rebound - guarding or rigidity. ?No Cyanosis, Clubbing or edema, No new Rash or bruise   ? ?  ? ?RN pressure injury documentation: ?Pressure Injury 08/23/21 Sacrum Mid Stage 2 -  Partial thickness loss of dermis presenting as a shallow open injury with a red, pink wound bed without slough. pink bedding (Active)  ?08/23/21 0933  ?Location: Sacrum  ?Location Orientation: Mid  ?Staging: Stage 2 -  Partial thickness loss of dermis presenting as a shallow open injury with a red, pink wound bed without slough.  ?Wound Description (Comments): pink bedding  ?Present on Admission: Yes  ?Dressing Type Foam - Lift dressing to assess site every shift 08/26/21 2220  ? ? ? Data Review:  ? ? ?CBC ?Recent Labs  ?Lab 08/23/21 ?0945 08/23/21 ?2345 08/25/21 ?0148 08/26/21 ?[id number] 08/27/21 ?0710  ?WBC 10.6* 8.0 7.1 6.8 6.1  ?HGB 11.9* 10.7* 9.6* 9.3* 9.3*  ?HCT 36.1* 32.7* 28.3* 28.3* 28.0*  ?PLT 190 176 145* 159 167  ?MCV 102.0* 100.3* 101.4* 100.0 100.0  ?MCH 33.6 32.8 34.4* 32.9 33.2  ?MCHC 33.0 32.7 33.9 32.9 33.2  ?RDW 13.1 13.2 13.2 13.0 12.9  ?LYMPHSABS 0.4*  --  0.7 0.7 0.6*  ?MONOABS 0.7  --  0.8 0.7 0.7  ?EOSABS 0.0  --  0.1 0.1 0.2  ?BASOSABS 0.0  --  0.0 0.0 0.0  ? ? ? ?Electrolytes ?Recent Labs  ?Lab 08/22/21 ?0805 08/22/21 ?[id number] 08/23/21 ?0945 08/23/21 ?2339 08/23/21 ?2345 08/25/21 ?0148 08/26/21 ?[id number] 08/27/21 ?0710  ?NA  --    < > 137  --  140 142 144 141  ?K  --    < > 4.9  --  4.4 3.7 3.8 3.7  ?CL  --   --  104  --  107 111 109 105  ?CO2  --   --  24  --  25 23 26 28   ?GLUCOSE  --   --  145*  --  107* 104* 98 107*  ?BUN  --   --  35*  --   35* 28* 27* 25*  ?CREATININE  --   --  1.29*  --  1.07 0.86 0.91 0.87  ?CALCIUM  --   --  8.8*  --  8.6* 7.5* 8.5* 8.3*  ?AST 31  --  35  --   --  38 33 24  ?ALT 29  --  32  --   --  25 25 23   ?ALKPHOS 59

## 2021-08-27 NOTE — Progress Notes (Signed)
Per the request of Dellia Cloud, MD and Amada Jupiter, MD neurology please inform them of any sleep disturbances this pt may have.   ?

## 2021-08-27 NOTE — Progress Notes (Signed)
Orthopedic Tech Progress Note ?Patient Details:  ?David Scott ?11-Dec-1940 ?[id number] ? ?Ortho Devices ?Type of Ortho Device: Arm sling ?Ortho Device/Splint Location: RUE ?Ortho Device/Splint Interventions: Ordered, Application ?  ?Post Interventions ?Patient Tolerated: Well ? ?[name] ?08/27/2021, 11:08 AM ? ?

## 2021-08-27 NOTE — Progress Notes (Signed)
Physical Therapy Treatment ?Patient Details ?Name: David Scott ?MRN: [id number] ?DOB: 1941-03-14 ?Today's Date: 08/27/2021 ? ? ?History of Present Illness 81 y.o. male who presents 08/23/21 after having a fall and noted to be more altered. 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 ?  ?PT Comments  ? ? Patient alert throughout session but with eyes closed most of the time. Able to verbalize and express his needs (asking for water). BP still dropping with mobility, but not as low as last session and with pt denying dizziness. Able to progress to standing with +2 max assist, but not able to progress to taking steps. ?   ?Recommendations for follow up therapy are one component of a multi-disciplinary discharge planning process, led by the attending physician.  Recommendations may be updated based on patient status, additional functional criteria and insurance authorization. ? ?Follow Up Recommendations ? Skilled nursing-short term rehab (<3 hours/day) (unless significant improvement once no longer orthostatic and son can provide care) ?  ?  ?Assistance Recommended at Discharge Frequent or constant Supervision/Assistance  ?Patient can return home with the following A lot of help with walking and/or transfers;Assistance with cooking/housework;Direct supervision/assist for medications management;Direct supervision/assist for financial management;Assist for transportation;Help with stairs or ramp for entrance ?  ?Equipment Recommendations ? None recommended by PT  ?  ?Recommendations for Other Services   ? ? ?  ?Precautions / Restrictions Precautions ?Precautions: Fall ?Required Braces or Orthoses: Sling ?Restrictions ?Weight Bearing Restrictions: Yes ?RUE Weight Bearing: Non weight bearing  ?  ? ?Mobility ? Bed Mobility ?Overal bed mobility: Needs Assistance ?Bed Mobility: Supine to Sit, Sit to  Supine ?  ?  ?Supine to sit: +2 for physical assistance, Total assist ?Sit to supine: +2 for physical assistance, Total assist ?  ?General bed mobility comments: with multimodal cues, initiated LEs toward EOB slightly ?  ? ?Transfers ?Overall transfer level: Needs assistance ?  ?Transfers: Sit to/from Stand ?Sit to Stand: +2 physical assistance, Max assist ?  ?  ?  ?  ?  ?General transfer comment: use of bed pad under hips, knees blocked, unable to shift weight, flexed posture ?  ? ?Ambulation/Gait ?  ?  ?  ?  ?  ?  ?  ?General Gait Details: unable to take steps ? ? ?Stairs ?  ?  ?  ?  ?  ? ? ?Wheelchair Mobility ?  ? ?Modified Rankin (Stroke Patients Only) ?  ? ? ?  ?Balance Overall balance assessment: Needs assistance ?Sitting-balance support: No upper extremity supported, Feet supported ?Sitting balance-Leahy Scale: Fair ?Sitting balance - Comments: sat EOB with close supervision while assessing BP ?  ?  ?Standing balance-Leahy Scale: Zero ?Standing balance comment: +2 max assist ?  ?  ?  ?  ?  ?  ?  ?  ?  ?  ?  ?  ? ?  ?Cognition Arousal/Alertness: Awake/alert, Lethargic (eyes closed much of session, but pt responsive) ?Behavior During Therapy: Flat affect ?Overall Cognitive Status: Impaired/Different from baseline ?Area of Impairment: Orientation, Attention, Memory, Following commands, Safety/judgement, Awareness, Problem solving ?  ?  ?  ?  ?  ?  ?  ?  ?Orientation Level: Disoriented to, Place, Time, Situation ?Current Attention Level: Sustained ?Memory: Decreased short-term memory, Decreased recall of precautions ?Following Commands: Follows one step commands inconsistently, Follows one step commands with increased time ?Safety/Judgement: Decreased awareness of safety,  Decreased awareness of deficits ?Awareness: Intellectual ?Problem Solving: Slow processing, Decreased initiation, Difficulty sequencing, Requires verbal cues, Requires tactile cues ?General Comments: Pt more verbal today and making needs  known. ?  ?  ? ?  ?Exercises   ? ?  ?General Comments   ?  ?  ? ?Pertinent Vitals/Pain Pain Assessment ?Faces Pain Scale: Hurts even more ?Pain Location: R shoulder ?Pain Descriptors / Indicators: Grimacing, Guarding, Discomfort ?Pain Intervention(s): Limited activity within patient's tolerance, Monitored during session  ? ? ?Home Living   ?  ?  ?  ?  ?  ?  ?  ?  ?  ?   ?  ?Prior Function    ?  ?  ?   ? ?PT Goals (current goals can now be found in the care plan section) Acute Rehab PT Goals ?Patient Stated Goal: unable to state, but participates during session ?PT Goal Formulation: Patient unable to participate in goal setting ?Time For Goal Achievement: 09/07/21 ?Potential to Achieve Goals: Fair ?Progress towards PT goals: Progressing toward goals ? ?  ?Frequency ? ? ? Min 2X/week ? ? ? ?  ?PT Plan Current plan remains appropriate;Frequency needs to be updated  ? ? ?Co-evaluation   ?  ?  ?  ?  ? ?  ?AM-PAC PT "6 Clicks" Mobility   ?Outcome Measure ? Help needed turning from your back to your side while in a flat bed without using bedrails?: Total ?Help needed moving from lying on your back to sitting on the side of a flat bed without using bedrails?: Total ?Help needed moving to and from a bed to a chair (including a wheelchair)?: Total ?Help needed standing up from a chair using your arms (e.g., wheelchair or bedside chair)?: Total ?Help needed to walk in hospital room?: Total ?Help needed climbing 3-5 steps with a railing? : Total ?6 Click Score: 6 ? ?  ?End of Session Equipment Utilized During Treatment: Other (comment) (RUE sling) ?Activity Tolerance: Treatment limited secondary to medical complications (Comment) (orthostasis) ?Patient left: in bed;with call bell/phone within reach;with bed alarm set ?  ?PT Visit Diagnosis: History of falling (Z91.81) ?  ? ? ?Time: 5188-4166 ?PT Time Calculation (min) (ACUTE ONLY): 23 min ? ?Charges:  $Therapeutic Activity: 8-22 mins          ?          ? ? ?[name], PT ?Acute  Rehabilitation Services  ?Pager [phone] ?Office [phone] ? ? ? ?Scherrie November Rithy Mandley ?08/27/2021, 11:30 AM ? ?

## 2021-08-27 NOTE — Progress Notes (Signed)
Subjective: ?Patient resting in bed. He remains altered from baseline. He continues to have answer questions with low tone with one to two word answers. The answers are appropriate. He has a apart generalized weakness and upper extremity asterixis.  Otherwise, he denies any complaints today.  ? ?Exam: ?Vitals:  ? 08/27/21 0310 08/27/21 0724  ?BP: 137/63 140/83  ?Pulse: 79 66  ?Resp: 16 18  ?Temp: 98.1 ?F (36.7 ?C) 98.6 ?F (37 ?C)  ?SpO2: 95% 96%  ? ?Gen: In bed, NAD ?Resp: non-labored breathing, no acute distress ?Abd: soft, nt ? ?Neuro: ?MS: Patient is sleepy appearing with slowed responses. Alert to person and place.  ?CN: grossly intact ?Motor: generalized weakness without significant focal deficits. Aterixis present in bilateral upper extremities.  ?Sensory:grossly intact ?DTR: BR 2+ and Patellar 2+ ? ?Pertinent Labs: ? ?  Latest Ref Rng & Units 08/27/2021  ?  7:10 AM 08/26/2021  ? 12:46 AM 08/25/2021  ?  1:48 AM  ?CBC  ?WBC 4.0 - 10.5 K/uL 6.1   6.8   7.1    ?Hemoglobin 13.0 - 17.0 g/dL 9.3   9.3   9.6    ?Hematocrit 39.0 - 52.0 % 28.0   28.3   28.3    ?Platelets 150 - 400 K/uL 167   159   145    ?  ? ?  Latest Ref Rng & Units 08/27/2021  ?  7:10 AM 08/26/2021  ? 12:46 AM 08/25/2021  ?  1:48 AM  ?CMP  ?Glucose 70 - 99 mg/dL 701   98   779    ?BUN 8 - 23 mg/dL 25   27   28     ?Creatinine 0.61 - 1.24 mg/dL   3.90   3.00    ?Sodium 135 - 145 mmol/L 141   144   142    ?Potassium 3.5 - 5.1 mmol/L 3.7   3.8   3.7    ?Chloride 98 - 111 mmol/L 105   109   111    ?CO2 22 - 32 mmol/L 28   26   23     ?Calcium 8.9 - 10.3 mg/dL 8.3   8.5   7.5    ?Total Protein 6.5 - 8.1 g/dL 5.0   5.0   4.9    ?Total Bilirubin 0.3 - 1.2 mg/dL 1.2   1.5   0.9    ?Alkaline Phos 38 - 126 U/L 44   46   44    ?AST 15 - 41 U/L 24   33   38    ?ALT 0 - 44 U/L 23   25   25     ?B1 in process ? ?Impression: ? ?Delirium secondary to acute illness and poor nutrition complicated by hospital delirium  ? ?Recommendations: ?1) Cont Delirium precautions ?2) Cont  assistance with meal and supplement with protein shack ?3) Cont multivitamin and high dose thiamine  ? ?9.23, D.O.  ?Internal Medicine Resident, PGY-3 ? Internal Medicine Residency  ?8:25 AM, 08/27/2021  ? ?

## 2021-08-27 NOTE — Progress Notes (Signed)
Occupational Therapy Treatment ?Patient Details ?Name: David Scott ?MRN: [id number] ?DOB: 1941/02/13 ?Today's Date: 08/27/2021 ? ? ?History of present illness 81 y.o. male who presents 08/23/21 after having a fall and noted to be more altered. 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  ?OT comments ? Pt much more alert and responsive to questions. Remains disoriented, but able to make his needs known. Pt able to reach to his nose with L hand, myoclonus noted. Pt sat EOB with close supervision and stood with +2 max assist, unable to take steps. Worked on use of soft touch call button with L hand. Pt without reported symptoms, BP monitored 147/75 (97) in supine, 107/59 (75) after sitting several minutes, 122/91 (102) immediately upon return to supine.   ? ?Recommendations for follow up therapy are one component of a multi-disciplinary discharge planning process, led by the attending physician.  Recommendations may be updated based on patient status, additional functional criteria and insurance authorization. ?   ?Follow Up Recommendations ? Skilled nursing-short term rehab (<3 hours/day)  ?  ?Assistance Recommended at Discharge Frequent or constant Supervision/Assistance  ?Patient can return home with the following ? Two people to help with walking and/or transfers;A lot of help with bathing/dressing/bathroom;Assistance with cooking/housework;Assistance with feeding;Direct supervision/assist for medications management;Direct supervision/assist for financial management;Assist for transportation;Help with stairs or ramp for entrance ?  ?Equipment Recommendations ? Other (comment) (defer to next venue)  ?  ?Recommendations for Other Services   ? ?  ?Precautions / Restrictions Precautions ?Precautions: Fall ?Required Braces or Orthoses: Sling ?Restrictions ?Weight Bearing Restrictions: Yes ?RUE Weight  Bearing: Non weight bearing  ? ? ?  ? ?Mobility Bed Mobility ?Overal bed mobility: Needs Assistance ?Bed Mobility: Supine to Sit, Sit to Supine ?  ?  ?Supine to sit: +2 for physical assistance, Total assist ?Sit to supine: +2 for physical assistance, Total assist ?  ?General bed mobility comments: with multimodal cues, initiated LEs toward EOB slightly ?  ? ?Transfers ?Overall transfer level: Needs assistance ?  ?Transfers: Sit to/from Stand ?Sit to Stand: +2 physical assistance, Max assist ?  ?  ?  ?  ?  ?General transfer comment: use of bed pad under hips, knees blocked, unable to shift weight, flexed posture ?  ?  ?Balance Overall balance assessment: Needs assistance ?Sitting-balance support: No upper extremity supported, Feet supported ?Sitting balance-Leahy Scale: Fair ?Sitting balance - Comments: sat EOB with close supervision while assessing BP ?  ?  ?Standing balance-Leahy Scale: Zero ?Standing balance comment: +2 max assist ?  ?  ?  ?  ?  ?  ?  ?  ?  ?  ?  ?   ? ?ADL either performed or assessed with clinical judgement  ? ?ADL   ?  ?  ?  ?  ?  ?  ?  ?  ?  ?  ?  ?  ?  ?  ?  ?  ?  ?  ?  ?General ADL Comments: worked on pt using soft touch call button, unable ?  ? ?Extremity/Trunk Assessment Upper Extremity Assessment ?Upper Extremity Assessment: LUE deficits/detail ?RUE Coordination: decreased fine motor;decreased gross motor ?LUE Deficits / Details: pt without thumb, able to bring hand to nose, myoclonus at times ?LUE Coordination: decreased fine motor;decreased gross motor ?  ?  ?  ?  ?  ? ?Vision   ?  ?  ?  Perception   ?  ?Praxis   ?  ? ?Cognition Arousal/Alertness: Awake/alert, Lethargic (eyes closed much of session, but pt responsive) ?Behavior During Therapy: Flat affect ?Overall Cognitive Status: Impaired/Different from baseline ?Area of Impairment: Orientation, Attention, Memory, Following commands, Safety/judgement, Awareness, Problem solving ?  ?  ?  ?  ?  ?  ?  ?  ?Orientation Level: Disoriented to,  Place, Time, Situation ?Current Attention Level: Sustained ?Memory: Decreased short-term memory, Decreased recall of precautions ?Following Commands: Follows one step commands inconsistently, Follows one step commands with increased time ?Safety/Judgement: Decreased awareness of safety, Decreased awareness of deficits ?Awareness: Intellectual ?Problem Solving: Slow processing, Decreased initiation, Difficulty sequencing, Requires verbal cues, Requires tactile cues ?General Comments: Pt more verbal today and making needs known. ?  ?  ?   ?Exercises   ? ?  ?Shoulder Instructions   ? ? ?  ?General Comments    ? ? ?Pertinent Vitals/ Pain       Pain Assessment ?Pain Assessment: Faces ?Faces Pain Scale: Hurts even more ?Pain Location: R shoulder ?Pain Descriptors / Indicators: Grimacing, Guarding, Discomfort ?Pain Intervention(s): Monitored during session, Repositioned ? ?Home Living   ?  ?  ?  ?  ?  ?  ?  ?  ?  ?  ?  ?  ?  ?  ?  ?  ?  ?  ? ?  ?Prior Functioning/Environment    ?  ?  ?  ?   ? ?Frequency ? Min 2X/week  ? ? ? ? ?  ?Progress Toward Goals ? ?OT Goals(current goals can now be found in the care plan section) ? Progress towards OT goals: Progressing toward goals ? ?Acute Rehab OT Goals ?OT Goal Formulation: Patient unable to participate in goal setting ?Time For Goal Achievement: 09/07/21 ?Potential to Achieve Goals: Fair  ?Plan Discharge plan remains appropriate   ? ?Co-evaluation ? ? ?   ?  ?  ?  ?  ? ?  ?AM-PAC OT "6 Clicks" Daily Activity     ?Outcome Measure ? ? Help from another person eating meals?: Total ?Help from another person taking care of personal grooming?: Total ?Help from another person toileting, which includes using toliet, bedpan, or urinal?: Total ?Help from another person bathing (including washing, rinsing, drying)?: Total ?Help from another person to put on and taking off regular upper body clothing?: Total ?Help from another person to put on and taking off regular lower body clothing?:  Total ?6 Click Score: 6 ? ?  ?End of Session   ? ?OT Visit Diagnosis: Pain;History of falling (Z91.81);Other symptoms and signs involving cognitive function;Muscle weakness (generalized) (M62.81) ?Pain - Right/Left: Right ?Pain - part of body: Shoulder ?  ?Activity Tolerance Patient tolerated treatment well ?  ?Patient Left in bed;with call bell/phone within reach;with bed alarm set ?  ?Nurse Communication Other (comment) (aware pt needs to be fed) ?  ? ?   ? ?Time: 0919-1000 ?OT Time Calculation (min): 41 min ? ?Charges: OT General Charges ?$OT Visit: 1 Visit ?OT Treatments ?$Therapeutic Activity: 23-37 mins ? ?Martie Round, OTR/L ?Acute Rehabilitation Services ?Pager: [phone] ?Office: [phone]  ? ?David Scott ?08/27/2021, 10:42 AM ?

## 2021-08-27 NOTE — Progress Notes (Signed)
Speech Language Pathology Treatment: Dysphagia  ?Patient Details ?Name: David Scott ?MRN: [id number] ?DOB: 04-07-1941 ?Today's Date: 08/27/2021 ?Time: 1012-1022 ?SLP Time Calculation (min) (ACUTE ONLY): 10 min ? ?Assessment / Plan / Recommendation ?Clinical Impression ? Pt appears overall weak and acutely deconditioned but awake and receptive. Pharyngeal swallow appreciated but weak and subtle s/sx aspiration with delayed throat clear following sips thin after solid cracker trial. Single or 1-2 sips of thin did not result in cough, throat clear or a wet vocal quality. He should be in an upright position, small cup or straw sips and slow rate. Continue crush meds and full assist with meals. D/W pt various textures and he feels that Dys 3 (chopped meats) is appropriate and he would like to continue this. ST will continue to follow for safety/efficiency and potential need for instrumental study.  ?  ?HPI HPI: 81 y.o. male who presents 08/23/21 after having a fall and noted to be more altered. +rt proximal humeral fx; +dehydration; MRI brain and CXR negative.   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  ?   ?SLP Plan ? Continue with current plan of care ? ?  ?  ?Recommendations for follow up therapy are one component of a multi-disciplinary discharge planning process, led by the attending physician.  Recommendations may be updated based on patient status, additional functional criteria and insurance authorization. ?  ? ?Recommendations  ?Diet recommendations: Dysphagia 3 (mechanical soft);Thin liquid ?Liquids provided via: Cup;Straw ?Medication Administration: Crushed with puree ?Supervision: Staff to assist with self feeding;Full supervision/cueing for compensatory strategies ?Compensations: Slow rate;Small sips/bites ?Postural Changes and/or Swallow Maneuvers: Seated upright 90 degrees  ?   ?    ?   ? ? ? ? Oral Care Recommendations: Oral care  BID ?Follow Up Recommendations: Skilled nursing-short term rehab (<3 hours/day) ?Assistance recommended at discharge: Frequent or constant Supervision/Assistance ?SLP Visit Diagnosis: Dysphagia, unspecified (R13.10) ?Plan: Continue with current plan of care ? ? ? ? ?  ?  ? ? ?Royce Macadamia ? ?08/27/2021, 10:31 AM ?

## 2021-08-28 DIAGNOSIS — S42201S Unspecified fracture of upper end of right humerus, sequela: Secondary | ICD-10-CM | POA: Diagnosis not present

## 2021-08-28 DIAGNOSIS — Z66 Do not resuscitate: Secondary | ICD-10-CM | POA: Diagnosis not present

## 2021-08-28 DIAGNOSIS — R627 Adult failure to thrive: Secondary | ICD-10-CM | POA: Diagnosis not present

## 2021-08-28 DIAGNOSIS — Z7189 Other specified counseling: Secondary | ICD-10-CM | POA: Diagnosis not present

## 2021-08-28 DIAGNOSIS — L89159 Pressure ulcer of sacral region, unspecified stage: Secondary | ICD-10-CM | POA: Diagnosis not present

## 2021-08-28 DIAGNOSIS — L02818 Cutaneous abscess of other sites: Secondary | ICD-10-CM | POA: Diagnosis not present

## 2021-08-28 DIAGNOSIS — D539 Nutritional anemia, unspecified: Secondary | ICD-10-CM | POA: Diagnosis not present

## 2021-08-28 DIAGNOSIS — I639 Cerebral infarction, unspecified: Secondary | ICD-10-CM | POA: Diagnosis not present

## 2021-08-28 DIAGNOSIS — M2550 Pain in unspecified joint: Secondary | ICD-10-CM | POA: Diagnosis not present

## 2021-08-28 DIAGNOSIS — S42294D Other nondisplaced fracture of upper end of right humerus, subsequent encounter for fracture with routine healing: Secondary | ICD-10-CM | POA: Diagnosis not present

## 2021-08-28 DIAGNOSIS — S42201A Unspecified fracture of upper end of right humerus, initial encounter for closed fracture: Secondary | ICD-10-CM | POA: Diagnosis not present

## 2021-08-28 DIAGNOSIS — Z9181 History of falling: Secondary | ICD-10-CM | POA: Diagnosis not present

## 2021-08-28 DIAGNOSIS — S51011A Laceration without foreign body of right elbow, initial encounter: Secondary | ICD-10-CM | POA: Diagnosis not present

## 2021-08-28 DIAGNOSIS — R000 Tachycardia, unspecified: Secondary | ICD-10-CM | POA: Diagnosis not present

## 2021-08-28 DIAGNOSIS — R41841 Cognitive communication deficit: Secondary | ICD-10-CM | POA: Diagnosis not present

## 2021-08-28 DIAGNOSIS — R296 Repeated falls: Secondary | ICD-10-CM | POA: Diagnosis not present

## 2021-08-28 DIAGNOSIS — R001 Bradycardia, unspecified: Secondary | ICD-10-CM | POA: Diagnosis not present

## 2021-08-28 DIAGNOSIS — Z825 Family history of asthma and other chronic lower respiratory diseases: Secondary | ICD-10-CM | POA: Diagnosis not present

## 2021-08-28 DIAGNOSIS — S42294S Other nondisplaced fracture of upper end of right humerus, sequela: Secondary | ICD-10-CM | POA: Diagnosis not present

## 2021-08-28 DIAGNOSIS — K219 Gastro-esophageal reflux disease without esophagitis: Secondary | ICD-10-CM | POA: Diagnosis not present

## 2021-08-28 DIAGNOSIS — N289 Disorder of kidney and ureter, unspecified: Secondary | ICD-10-CM | POA: Diagnosis not present

## 2021-08-28 DIAGNOSIS — G231 Progressive supranuclear ophthalmoplegia [Steele-Richardson-Olszewski]: Secondary | ICD-10-CM | POA: Diagnosis not present

## 2021-08-28 DIAGNOSIS — G9349 Other encephalopathy: Secondary | ICD-10-CM | POA: Diagnosis not present

## 2021-08-28 DIAGNOSIS — I6930 Unspecified sequelae of cerebral infarction: Secondary | ICD-10-CM | POA: Diagnosis not present

## 2021-08-28 DIAGNOSIS — E43 Unspecified severe protein-calorie malnutrition: Secondary | ICD-10-CM | POA: Diagnosis not present

## 2021-08-28 DIAGNOSIS — R1312 Dysphagia, oropharyngeal phase: Secondary | ICD-10-CM | POA: Diagnosis not present

## 2021-08-28 DIAGNOSIS — I69828 Other speech and language deficits following other cerebrovascular disease: Secondary | ICD-10-CM | POA: Diagnosis not present

## 2021-08-28 DIAGNOSIS — N400 Enlarged prostate without lower urinary tract symptoms: Secondary | ICD-10-CM | POA: Diagnosis not present

## 2021-08-28 DIAGNOSIS — Z8673 Personal history of transient ischemic attack (TIA), and cerebral infarction without residual deficits: Secondary | ICD-10-CM | POA: Diagnosis not present

## 2021-08-28 DIAGNOSIS — L89154 Pressure ulcer of sacral region, stage 4: Secondary | ICD-10-CM | POA: Diagnosis not present

## 2021-08-28 DIAGNOSIS — L89153 Pressure ulcer of sacral region, stage 3: Secondary | ICD-10-CM | POA: Diagnosis not present

## 2021-08-28 DIAGNOSIS — L89893 Pressure ulcer of other site, stage 3: Secondary | ICD-10-CM | POA: Diagnosis not present

## 2021-08-28 DIAGNOSIS — M6281 Muscle weakness (generalized): Secondary | ICD-10-CM | POA: Diagnosis not present

## 2021-08-28 DIAGNOSIS — L89324 Pressure ulcer of left buttock, stage 4: Secondary | ICD-10-CM | POA: Diagnosis not present

## 2021-08-28 DIAGNOSIS — R2689 Other abnormalities of gait and mobility: Secondary | ICD-10-CM | POA: Diagnosis not present

## 2021-08-28 DIAGNOSIS — L89150 Pressure ulcer of sacral region, unstageable: Secondary | ICD-10-CM | POA: Diagnosis not present

## 2021-08-28 DIAGNOSIS — Z993 Dependence on wheelchair: Secondary | ICD-10-CM | POA: Diagnosis not present

## 2021-08-28 DIAGNOSIS — D72829 Elevated white blood cell count, unspecified: Secondary | ICD-10-CM | POA: Diagnosis not present

## 2021-08-28 DIAGNOSIS — I700 Atherosclerosis of aorta: Secondary | ICD-10-CM | POA: Diagnosis not present

## 2021-08-28 DIAGNOSIS — I10 Essential (primary) hypertension: Secondary | ICD-10-CM | POA: Diagnosis not present

## 2021-08-28 DIAGNOSIS — G9341 Metabolic encephalopathy: Secondary | ICD-10-CM | POA: Diagnosis not present

## 2021-08-28 DIAGNOSIS — Z515 Encounter for palliative care: Secondary | ICD-10-CM | POA: Diagnosis not present

## 2021-08-28 DIAGNOSIS — Z681 Body mass index (BMI) 19 or less, adult: Secondary | ICD-10-CM | POA: Diagnosis not present

## 2021-08-28 DIAGNOSIS — M6259 Muscle wasting and atrophy, not elsewhere classified, multiple sites: Secondary | ICD-10-CM | POA: Diagnosis not present

## 2021-08-28 DIAGNOSIS — R413 Other amnesia: Secondary | ICD-10-CM | POA: Diagnosis not present

## 2021-08-28 DIAGNOSIS — B372 Candidiasis of skin and nail: Secondary | ICD-10-CM | POA: Diagnosis not present

## 2021-08-28 DIAGNOSIS — L02212 Cutaneous abscess of back [any part, except buttock]: Secondary | ICD-10-CM | POA: Diagnosis not present

## 2021-08-28 DIAGNOSIS — R262 Difficulty in walking, not elsewhere classified: Secondary | ICD-10-CM | POA: Diagnosis not present

## 2021-08-28 DIAGNOSIS — R54 Age-related physical debility: Secondary | ICD-10-CM | POA: Diagnosis not present

## 2021-08-28 DIAGNOSIS — J449 Chronic obstructive pulmonary disease, unspecified: Secondary | ICD-10-CM | POA: Diagnosis not present

## 2021-08-28 DIAGNOSIS — I959 Hypotension, unspecified: Secondary | ICD-10-CM | POA: Diagnosis not present

## 2021-08-28 DIAGNOSIS — L089 Local infection of the skin and subcutaneous tissue, unspecified: Secondary | ICD-10-CM | POA: Diagnosis not present

## 2021-08-28 DIAGNOSIS — M25511 Pain in right shoulder: Secondary | ICD-10-CM | POA: Diagnosis not present

## 2021-08-28 DIAGNOSIS — K4090 Unilateral inguinal hernia, without obstruction or gangrene, not specified as recurrent: Secondary | ICD-10-CM | POA: Diagnosis not present

## 2021-08-28 DIAGNOSIS — I69351 Hemiplegia and hemiparesis following cerebral infarction affecting right dominant side: Secondary | ICD-10-CM | POA: Diagnosis not present

## 2021-08-28 DIAGNOSIS — Z7401 Bed confinement status: Secondary | ICD-10-CM | POA: Diagnosis not present

## 2021-08-28 DIAGNOSIS — K439 Ventral hernia without obstruction or gangrene: Secondary | ICD-10-CM | POA: Diagnosis not present

## 2021-08-28 DIAGNOSIS — L8990 Pressure ulcer of unspecified site, unspecified stage: Secondary | ICD-10-CM | POA: Diagnosis not present

## 2021-08-28 DIAGNOSIS — S42201D Unspecified fracture of upper end of right humerus, subsequent encounter for fracture with routine healing: Secondary | ICD-10-CM | POA: Diagnosis not present

## 2021-08-28 DIAGNOSIS — Z5320 Procedure and treatment not carried out because of patient's decision for unspecified reasons: Secondary | ICD-10-CM | POA: Diagnosis not present

## 2021-08-28 DIAGNOSIS — J9691 Respiratory failure, unspecified with hypoxia: Secondary | ICD-10-CM | POA: Diagnosis not present

## 2021-08-28 DIAGNOSIS — R5383 Other fatigue: Secondary | ICD-10-CM | POA: Diagnosis not present

## 2021-08-28 LAB — VITAMIN B1: Vitamin B1 (Thiamine): 118.6 nmol/L (ref 66.5–200.0)

## 2021-08-28 MED ORDER — PANTOPRAZOLE SODIUM 40 MG PO TBEC
40.0000 mg | DELAYED_RELEASE_TABLET | Freq: Every day | ORAL | Status: AC
Start: 2021-08-29 — End: ?

## 2021-08-28 MED ORDER — ACETAMINOPHEN 325 MG PO TABS: 650.0000 mg | ORAL_TABLET | Freq: Four times a day (QID) | ORAL | Status: AC | PRN

## 2021-08-28 MED ORDER — THIAMINE HCL 100 MG PO TABS: 100.0000 mg | ORAL_TABLET | Freq: Every day | ORAL | Status: AC

## 2021-08-28 MED ORDER — PROSOURCE PLUS PO LIQD: 30.0000 mL | Freq: Three times a day (TID) | ORAL | Status: DC

## 2021-08-28 MED ORDER — BOOST PLUS PO LIQD
237.0000 mL | Freq: Three times a day (TID) | ORAL | 0 refills | Status: AC
Start: 2021-08-28 — End: ?

## 2021-08-28 MED ORDER — MIRTAZAPINE 15 MG PO TBDP: 15.0000 mg | ORAL_TABLET | Freq: Every day | ORAL | Status: AC

## 2021-08-28 MED ORDER — ENSURE ENLIVE PO LIQD: 237.0000 mL | Freq: Three times a day (TID) | ORAL | 12 refills | Status: AC

## 2021-08-28 NOTE — Progress Notes (Addendum)
Subjective: ?Patient resting in bed.  He appears more alert and oriented today.  He is responding to questions with multiple word sentences.  He denies any significant new symptoms today.  He still does not have a good memory of the events leading to his hospitalization.  Does endorse feeling very thirsty. ? ?Exam: ?Vitals:  ? 08/28/21 0400 08/28/21 0801  ?BP: (!) 164/77 136/70  ?Pulse: 85 (!) 57  ?Resp: 19 20  ?Temp: 99.2 ?F (37.3 ?C) 98 ?F (36.7 ?C)  ?SpO2: 98% 95%  ? ?Gen: In bed, NAD ?Resp: non-labored breathing, no acute distress ?Abd: soft, nt ? ?Neuro: ?MS:  ?Patient is alert to person, place, and time (month and year), not oriented to events leading up to hospitalization.  Patient was able to calculate number of quarters in $2.75 bowel world forward but not backwards.  There is a significant improvement from yesterday. ?CN: ?Cranial nerves grossly intact ?Motor:  ?Generalized weakness with no focal deficits ?Sensory: ?Sensations grossly intact bilaterally ? ?Pertinent Labs: ? ?  Latest Ref Rng & Units 08/27/2021  ?  7:10 AM 08/26/2021  ? 12:46 AM 08/25/2021  ?  1:48 AM  ?CBC  ?WBC 4.0 - 10.5 K/uL 6.1   6.8   7.1    ?Hemoglobin 13.0 - 17.0 g/dL 9.3   9.3   9.6    ?Hematocrit 39.0 - 52.0 % 28.0   28.3   28.3    ?Platelets 150 - 400 K/uL 167   159   145    ? ? ?  Latest Ref Rng & Units 08/27/2021  ?  7:10 AM 08/26/2021  ? 12:46 AM 08/25/2021  ?  1:48 AM  ?CMP  ?Glucose 70 - 99 mg/dL 107   98   104    ?BUN 8 - 23 mg/dL 25   27   28     ?Creatinine 0.61 - 1.24 mg/dL 0.87   0.91   0.86    ?Sodium 135 - 145 mmol/L 141   144   142    ?Potassium 3.5 - 5.1 mmol/L 3.7   3.8   3.7    ?Chloride 98 - 111 mmol/L 105   109   111    ?CO2 22 - 32 mmol/L 28   26   23     ?Calcium 8.9 - 10.3 mg/dL 8.3   8.5   7.5    ?Total Protein 6.5 - 8.1 g/dL 5.0   5.0   4.9    ?Total Bilirubin 0.3 - 1.2 mg/dL 1.2   1.5   0.9    ?Alkaline Phos 38 - 126 U/L 44   46   44    ?AST 15 - 41 U/L 24   33   38    ?ALT 0 - 44 U/L 23   25   25      ? ? ?Impression: ?Hospital Delirium: ?Patient's presentation is consistent with delirium from recent arm fracture and subsequent hospitalization he is making good improvements will likely continue to improve.  Still continue to have difficulty with taking medications.  However, he will eat and drink for me in the room.  Current plan is to discharge patient to skilled nursing facility for subacute rehabilitation.  This will likely benefit the patient as he is still significantly weak. ? ?Recommendations: ? ?Hospital Delirium: ?-Delirium precautions ?-Cont thiamine supplementation x3 days then discontinue ?-Continue to work on p.o. intake when possible ?-D/c to SNF for subacute PT ? ? ?Lawerance Cruel, D.O.  ?  Internal Medicine Resident, PGY-3 ?[facility]  ?10:40 AM, 08/28/2021  ? ?I have seen the patient, when I saw him he was slightly more sleepy, but waxing and waning is not unexpected.  His asterixis is significantly improved, and I would expect his mental status to continue to gradually improve over the next few weeks. ? ?Agree with supportive care in a nursing facility, he will likely need strong encouragement to eat and drink. ? ?Neurology be available as needed, please call if further questions or concerns. ? ?Roland Rack, MD ?[facility] ?[phone] ? ?If 7pm- 7am, please page neurology on call as listed in [place]. ? ?

## 2021-08-28 NOTE — Progress Notes (Signed)
[facility] ([facility]) Hospital Liaison Note  Notified by TOC manager of patient/family request for [facility] palliative services at SNF after discharge.   [facility] hospital liaison will follow patient for discharge disposition.   Please call with any hospice or outpatient palliative care related questions.   Thank you for the opportunity to participate in this patient's care.   Shanita Wicker, LCSW [facility] Hospital Liaison [phone]  

## 2021-08-28 NOTE — Progress Notes (Signed)
Patient d/c at 1900 with EMS, this RN did not receive any report on this patient because patient was on his way out at the time of shift change. Day shift nurse said she gave repot to the receiving facility. ?

## 2021-08-28 NOTE — Progress Notes (Signed)
Pt is refusing medications, provider aware. ?

## 2021-08-28 NOTE — Discharge Summary (Signed)
Physician Discharge Summary  ?David Scott ZOX:[id number] DOB: Aug 02, 1940 DOA: 08/23/2021 ? ?PCP: Elias Elseeade, Robert, MD ? ?Admit date: 08/23/2021 ?Discharge date: 08/28/2021 ? ?Admitted From: Home ?Disposition:  SNF ? ?Recommendations for Outpatient Follow-up:  ?Please obtain BMP/CBC in one week ?Please follow up on the following pending results: Vitamin E level ?Please keep encouraging patient to drink his nutritional supplements, and encouraging him to increase his oral intake, patient will need full assistance with feeding. ?Please have palliative to follow along at your facility. ? ? ? ?Discharge Condition:Stable ?CODE STATUS: DNR ?Diet recommendation: Regular Diet, he is feed with full assistance ? ?Brief/Interim Summary: ? ? 81 y.o. 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 ?  ?Acute metabolic encephalopathy in the presence of severe dehydration, AKI and severe protein calorie malnutrition.  ?- he appears extremely frail and cachectic, CT scan of the head X2,  2 different studies remains nonacute and MRI brain, he has no focal deficits. ?-EEG done 5/2, with triphasic phase, but no evidence of seizure activity. ?-Vitamin D within normal limit ?-Was treated with stress dose thiamine, vitamin B1 level sent prior to supplements within normal limit.  He will be discharged on thiamine supplement ?-Vitamin A pending at time of discharge ?-Continue with multivitamins ?-TSH within normal limit ?-Neurology has been consulted, given presence of triphasic waves on EEG, and presence of myoclonus and asterixis, endings felt related secondary to  toxic/metabolic etiology, he does have a history of memory problems, and therefore these findings likely related to multifocal delirium initially participated through histological stress from his fracture, pain, and hospital stay related delirium, poor eating and malnutrition, overall patient has been improving, he is more coherent currently, but he is severely frail, deconditioned, remains with poor oral intake and poor appetite for which he was started on mirtazapine, plan to discharge to subacute rehab which should certainly improve his delirium, and hopefully his appetite and oral intake. ?  ?  ?Failure to thrive/deconditioning/severe PCM ?Patient with  poor appetite, confusion, to thrive, cachectic, palliative medicine consulted, he is DNR, and commendation for palliative outpatient follow-up.   ?- Generalized weakness, severe deconditioning with recent fall with proximal right humerus fracture present on admission.  Arm in sling, as per orthopedics outpatient follow-up, this was during previous hospital visit few days prior to this admission. ?-Patient with severe PCM, with BMI of 17.8, continue with supplements, patient will need continuous encouragement drink his supplements and increased oral intake. ?-Started on mirtazapine to help with appetite ?  ?AKI.  Resolved with hydration ?  ?Chronic right-sided inguinal hernia.  Recent CT imaging shows it contains bowel but without any incarceration, nontender abdomen.  ?  ?History of stroke with minimal left-sided weakness on a chronic basis.  Supportive care.  Continue aspirin for secondary prevention. ?  ?recent fall with proximal right humerus fracture present on admission.   ?-Arm in sling, as per orthopedics outpatient follow-up, this was during previous hospital visit few days prior to this admission. ?  ?Chronic macrocytic anemia.   ?-Stable TSH and B12, outpatient PCP to monitor. ?  ?Intermittent myoclonic jerks noted.  ?-EEG with  no evidence of seizure  activities  ? ?Discharge Diagnoses:  ?Principal Problem: ?  Acute metabolic encephalopathy ?Active Problems: ?  Pressure injury of skin ?  Inguinal hernia ?  Frequent falls ?  Proximal humerus fracture ?  Renal insufficiency ?  Macrocytic anemia ?  History of stroke with residual deficit ? ? ? ?Discharge Instructions ? ?Discharge Instructions   ? ? Diet - low sodium heart healthy   Complete by: As directed ?  ? Discharge instructions   Complete by: As directed ?  ? Follow with Primary MD Elias Else, MD /SNF physician ? ?Get CBC, CMP,  checked  by Primary MD next visit.  ? ? ?Activity: As tolerated with Full fall precautions use walker/cane & assistance as needed ? ? ?Disposition SNF ? ? ?Diet: Regular Diet , with feeding assistance and aspiration precautions. ? ? ?On your next visit with your primary care physician please Get Medicines reviewed and adjusted. ? ? ?Please request your Prim.MD to go over all Hospital Tests and Procedure/Radiological results at the follow up, please get all Hospital records sent to your Prim MD by signing hospital release before you go home. ? ? ?If you experience worsening of your admission symptoms, develop shortness of breath, life threatening emergency, suicidal or homicidal thoughts you must seek medical attention immediately by calling 911 or calling your MD immediately  if symptoms less severe. ? ?You Must read complete instructions/literature along with all the possible adverse reactions/side effects for all the Medicines you take and that have been prescribed to you. Take any new Medicines after you have completely understood and accpet all the possible adverse reactions/side effects.  ? ?Do not drive, operating heavy machinery, perform activities at heights, swimming or participation in water activities or provide baby sitting services if your were admitted for syncope or siezures until you have seen by Primary MD or a Neurologist and advised to do so again. ? ?Do not drive  when taking Pain medications.  ? ? ?Do not take more than prescribed Pain, Sleep and Anxiety Medications ? ?Special Instructions: If you have smoked or chewed Tobacco  in the last 2 yrs please stop smoking, stop any regular Alcohol  and or any Recreational drug use. ? ?Wear Seat belts while driving. ? ? ?Please note ? ?You were cared for by a hospitalist during your hospital stay. If you have any questions about your discharge medications or the care you received while you were in the hospital after you are discharged, you can call the unit and asked to speak with the hospitalist on call if the hospitalist that took care of you is not available. Once you are discharged, your primary care physician will handle any further medical issues. Please note that NO REFILLS for any discharge medications will be authorized once you are discharged, as it is imperative that you return to your primary care physician (or establish a relationship with a primary care physician if you do not have one) for your aftercare needs so that they can reassess your need for medications and monitor your lab values.  ? Increase activity slowly   Complete by: As directed ?  ? No wound care   Complete by: As directed ?  ? ?  ? ?Allergies as of 08/28/2021   ? ?   Reactions  ? Benzonatate   ? Other reaction(s): vivid dreams  ? Tamsulosin Hcl   ? Other reaction(s): dizzy/achy  ? Septra [sulfamethoxazole-trimethoprim] Rash  ? Pt states MD  had him taking Sulfa by the handful when he had prostate infection, says they don't know what caused the rash  ? ?  ? ?  ?Medication List  ?  ? ?STOP taking these medications   ? ?furosemide 20 MG tablet ?Commonly known as: LASIX ?  ?HYDROcodone-acetaminophen 5-325 MG tablet ?Commonly known as: NORCO/VICODIN ?  ? ?  ? ?TAKE these medications   ? ?(feeding supplement) PROSource Plus liquid ?Take 30 mLs by mouth 4 (four) times daily - after meals and at bedtime. ?  ?lactose free nutrition Liqd ?Take 237 mLs by mouth 3  (three) times daily with meals. ?  ?feeding supplement Liqd ?Take 237 mLs by mouth 3 (three) times daily between meals. ?  ?acetaminophen 325 MG tablet ?Commonly known as: TYLENOL ?Take 2 tablets (650 mg to

## 2021-08-28 NOTE — Progress Notes (Signed)
? ?  Palliative Medicine Inpatient Follow Up Note ? ?HPI: ?81 y.o. 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 ?  ?Palliative care has been asked to address goals of care in the setting of chronic diseases and an increased frailty. ? ?Today's Discussion (08/28/2021): ? ?*Please note that this is a verbal dictation therefore any spelling or grammatical errors are due to the "[place]" system interpretation. ? ?Chart reviewed inclusive of vital signs, progress notes, laboratory results, and diagnostic images.  ? ?I met with [place] at bedside this morning. He was noted to be delirious though pleasant aware of person and place. We reviewed the plan for him to transition to [facility] rehabilitation this afternoon.  There is no family at bedside though patient's son is aware of plan for transfer per social work note review. ? ?Will ensure outpatient palliative care follows up with Elenore Rota upon discharge. ? ?Objective Assessment: ?Vital Signs ?Vitals:  ? 08/28/21 0801 08/28/21 1208  ?BP: 136/70 120/69  ?Pulse: (!) 57 66  ?Resp: 20 18  ?Temp: 98 ?F (36.7 ?C) 98.3 ?F (36.8 ?C)  ?SpO2: 95% 99%  ? ? ?Intake/Output Summary (Last 24 hours) at 08/28/2021 1335 ?Last data filed at 08/28/2021 0347 ?Gross per 24 hour  ?Intake --  ?Output 850 ml  ?Net -850 ml  ? ?Last Weight  Most recent update: 08/23/2021  9:07 AM  ? ? Weight  ?56.3 kg (124 lb 1.9 oz)  ?      ? ?  ? ?Gen: Elderly Frail caucasian M in NAD ?HEENT: moist mucous membranes ?CV: Regular rate and rhythm ?PULM: clear to auscultation bilaterally ?ABD: soft/nontender ?EXT: No edema ?Neuro: Alert  and oriented x2 - slow to respond ? ?SUMMARY OF RECOMMENDATIONS   ?DNAR/DNI ?  ?Advance Directives have been scanned Vynca ?  ?TOC - OP Palliative Care ?  ?Plan for SNF placement at [facility] ? ?MDM - Moderate ?______________________________________________________________________________________ ?Tacey Ruiz ?[facility] Team ?Team Cell Phone: [phone] ?Please utilize secure chat with additional questions, if there is no response within 30 minutes please call the above phone number ? ?Palliative Medicine Team providers are available by phone from 7am to 7pm daily and can be reached through the team cell phone.  ?Should this patient require assistance outside of these hours, please call the patient's attending physician. ? ? ? ? ?

## 2021-08-28 NOTE — TOC Transition Note (Signed)
Transition of Care (TOC) - CM/SW Discharge Note ? ? ?Patient Details  ?Name: David Scott ?MRN: [id number] ?Date of Birth: [birth year] ? ?Transition of Care (TOC) CM/SW Contact:  ?Mearl Latin, LCSW ?Phone Number: ?08/28/2021, 1:33 PM ? ? ?Clinical Narrative:    ?Patient will DC to: [facility] ?Anticipated DC date: 08/28/21  ?Family notified: Son, [name] ?Transport by: Sharin Mons ? ? ?Per MD patient ready for DC to [facility]. RN to call report prior to discharge [phone] room [phone]). RN, patient, patient's family, and facility notified of DC. Discharge Summary and FL2 sent to facility. DC packet on chart. Ambulance transport requested for patient.  ? ?CSW will sign off for now as social work intervention is no longer needed. Please consult [place] again if new needs arise. ? ? ? ? ?Final next level of care: Skilled Nursing Facility ?Barriers to Discharge: Barriers Resolved ? ? ?Patient Goals and CMS Choice ?Patient states their goals for this hospitalization and ongoing recovery are:: Rehab ?CMS Medicare.gov Compare Post Acute Care list provided to:: Patient Represenative (must comment) ?Choice offered to / list presented to : Adult Children ? ?Discharge Placement ?  ?Existing PASRR number confirmed : 08/28/21          ?Patient chooses bed at: [facility] ?Patient to be transferred to facility by: [facility] ?Name of family member notified: Son ?Patient and family notified of of transfer: 08/28/21 ? ?Discharge Plan and Services ?  ?  ?           ?  ?  ?  ?  ?  ?  ?  ?  ?  ?  ? ?Social Determinants of Health (SDOH) Interventions ?  ? ? ?Readmission Risk Interventions ?   ? View : No data to display.  ?  ?  ?  ? ? ? ? ? ?

## 2021-08-28 NOTE — TOC Progression Note (Addendum)
Transition of Care (TOC) - Progression Note  ? ? ?Patient Details  ?Name: EWAN CRESSEY ?MRN: [id number] ?Date of Birth: [birth year] ? ?Transition of Care (TOC) CM/SW Contact  ?Benard Halsted, LCSW ?Phone Number: ?08/28/2021, 9:42 AM ? ?Clinical Narrative:    ?CSW spoke with patient's son, Fara Olden. He has requested [facility]. CSW confirmed that [facility] can accept patient today if stable. CSW received consult from Palliative to arrange outpatient palliative services. Per [facility], they utilize [facility]. CSW sent referral to [facility] for [facility] up at [facility].   ? ? ?Expected Discharge Plan: [place] ?Barriers to Discharge: Ship broker, Continued Medical Work up, SNF Pending bed offer ? ?Expected Discharge Plan and Services ?Expected Discharge Plan: [place] ?  ?  ?  ?Living arrangements for the past 2 months: [place] ?                ?  ?  ?  ?  ?  ?  ?  ?  ?  ?  ? ? ?Social Determinants of Health (SDOH) Interventions ?  ? ?Readmission Risk Interventions ?   ? View : No data to display.  ?  ?  ?  ? ? ?

## 2021-08-28 NOTE — Discharge Instructions (Signed)
Follow with Primary MD Elias Else, MD /SNF physician ? ?Get CBC, CMP,  checked  by Primary MD next visit.  ? ? ?Activity: As tolerated with Full fall precautions use walker/cane & assistance as needed ? ? ?Disposition SNF ? ? ?Diet: Regular Diet , with feeding assistance and aspiration precautions. ? ? ?On your next visit with your primary care physician please Get Medicines reviewed and adjusted. ? ? ?Please request your Prim.MD to go over all Hospital Tests and Procedure/Radiological results at the follow up, please get all Hospital records sent to your Prim MD by signing hospital release before you go home. ? ? ?If you experience worsening of your admission symptoms, develop shortness of breath, life threatening emergency, suicidal or homicidal thoughts you must seek medical attention immediately by calling 911 or calling your MD immediately  if symptoms less severe. ? ?You Must read complete instructions/literature along with all the possible adverse reactions/side effects for all the Medicines you take and that have been prescribed to you. Take any new Medicines after you have completely understood and accpet all the possible adverse reactions/side effects.  ? ?Do not drive, operating heavy machinery, perform activities at heights, swimming or participation in water activities or provide baby sitting services if your were admitted for syncope or siezures until you have seen by Primary MD or a Neurologist and advised to do so again. ? ?Do not drive when taking Pain medications.  ? ? ?Do not take more than prescribed Pain, Sleep and Anxiety Medications ? ?Special Instructions: If you have smoked or chewed Tobacco  in the last 2 yrs please stop smoking, stop any regular Alcohol  and or any Recreational drug use. ? ?Wear Seat belts while driving. ? ? ?Please note ? ?You were cared for by a hospitalist during your hospital stay. If you have any questions about your discharge medications or the care you received  while you were in the hospital after you are discharged, you can call the unit and asked to speak with the hospitalist on call if the hospitalist that took care of you is not available. Once you are discharged, your primary care physician will handle any further medical issues. Please note that NO REFILLS for any discharge medications will be authorized once you are discharged, as it is imperative that you return to your primary care physician (or establish a relationship with a primary care physician if you do not have one) for your aftercare needs so that they can reassess your need for medications and monitor your lab values.  ?

## 2021-08-28 NOTE — Progress Notes (Signed)
Speech Language Pathology Treatment: Dysphagia  ?Patient Details ?Name: David Scott ?MRN: [id number] ?DOB: 1940-11-27 ?Today's Date: 08/28/2021 ?Time: 1211-1220 ?SLP Time Calculation (min) (ACUTE ONLY): 9 min ? ?Assessment / Plan / Recommendation ?Clinical Impression ? Pt was assisted with PO intake, mostly wanting to consume his Ensure but also taking a few bites of food. He sometimes has multiple subswallows, but today, throat clearing is not observed until after pt has stopped PO intake. He did not have overt coughing while consuming liquids, even when allowed to self-pace with larger, more consecutive sips. Lung sounds remain clear and he is afebrile. It appears as though he may be discharging today, so would continue f/u at SNF. ?  ?HPI HPI: 81 y.o. male who presents 08/23/21 after having a fall and noted to be more altered. +rt proximal humeral fx; +dehydration; MRI brain and CXR negative.   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  ?   ?SLP Plan ? Continue with current plan of care ? ?  ?  ?Recommendations for follow up therapy are one component of a multi-disciplinary discharge planning process, led by the attending physician.  Recommendations may be updated based on patient status, additional functional criteria and insurance authorization. ?  ? ?Recommendations  ?Diet recommendations: Dysphagia 3 (mechanical soft);Thin liquid ?Liquids provided via: Cup;Straw ?Medication Administration: Crushed with puree ?Supervision: Staff to assist with self feeding;Full supervision/cueing for compensatory strategies ?Compensations: Slow rate;Small sips/bites ?Postural Changes and/or Swallow Maneuvers: Seated upright 90 degrees  ?   ?    ?   ? ? ? ? Oral Care Recommendations: Oral care BID ?Follow Up Recommendations: Skilled nursing-short term rehab (<3 hours/day) ?Assistance recommended at discharge: Frequent or constant Supervision/Assistance ?SLP  Visit Diagnosis: Dysphagia, unspecified (R13.10) ?Plan: Continue with current plan of care ? ? ? ? ?  ?  ? ? ?Osie Bond., M.A. CCC-SLP ?Acute Rehabilitation Services ?Office [phone] ? ?Secure chat preferred ? ? ?08/28/2021, 12:50 PM ?

## 2021-08-31 ENCOUNTER — Encounter: Payer: Self-pay | Admitting: Adult Health

## 2021-08-31 ENCOUNTER — Non-Acute Institutional Stay (SKILLED_NURSING_FACILITY): Payer: Medicare Other | Admitting: Adult Health

## 2021-08-31 DIAGNOSIS — K4090 Unilateral inguinal hernia, without obstruction or gangrene, not specified as recurrent: Secondary | ICD-10-CM

## 2021-08-31 DIAGNOSIS — L89150 Pressure ulcer of sacral region, unstageable: Secondary | ICD-10-CM | POA: Diagnosis not present

## 2021-08-31 DIAGNOSIS — L8990 Pressure ulcer of unspecified site, unspecified stage: Secondary | ICD-10-CM | POA: Diagnosis not present

## 2021-08-31 DIAGNOSIS — R627 Adult failure to thrive: Secondary | ICD-10-CM | POA: Diagnosis not present

## 2021-08-31 DIAGNOSIS — G9341 Metabolic encephalopathy: Secondary | ICD-10-CM | POA: Diagnosis not present

## 2021-08-31 DIAGNOSIS — S42294S Other nondisplaced fracture of upper end of right humerus, sequela: Secondary | ICD-10-CM

## 2021-08-31 DIAGNOSIS — I6930 Unspecified sequelae of cerebral infarction: Secondary | ICD-10-CM

## 2021-08-31 LAB — VITAMIN E
Vitamin E (Alpha Tocopherol): 9.1 mg/L (ref 9.0–29.0)
Vitamin E(Gamma Tocopherol): 0.4 mg/L — ABNORMAL LOW (ref 0.5–4.9)

## 2021-08-31 NOTE — Progress Notes (Signed)
? ?Location:  [facility] ?Nursing Home Room Number: 311-A ?Place of Service:  SNF (31) ?Provider:  Kenard Gower, DNP, FNP-BC ? ?Patient Care Team: ?Elias Else, MD as PCP - General (Family Medicine) ?Chilton Si, MD as PCP - Cardiology (Cardiology) ? ?Extended Emergency Contact Information ?Primary Emergency Contact: Bittman,Joel ?Address: [address] ?         [place], [place] [address] [place] ?Home Phone: [phone] ?Mobile Phone: [phone] ?Relation: Son ?Secondary Emergency Contact: Booker,Jennifer ?Mobile Phone: [phone] ?Relation: Daughter ? ?Code Status:  DNR ? ?Goals of care: Advanced Directive information ? ?  09/01/2021  ? 11:30 AM  ?Advanced Directives  ?Does Patient Have a Medical Advance Directive? Yes  ?Type of Estate agent of [place];Out of facility DNR (pink MOST or yellow form)  ?Does patient want to make changes to medical advance directive? No - Patient declined  ?Copy of Healthcare Power of Attorney in Chart? Yes - validated most recent copy scanned in chart (See row information)  ?Pre-existing out of facility DNR order (yellow form or pink MOST form) Yellow form placed in chart (order not valid for inpatient use)  ? ? ? ?Chief Complaint  ?Patient presents with  ? Hospitalization Follow-up  ? ? ?HPI:  ?Pt is a 81 y.o. male who was admitted to [facility] on 08/28/21 post hospital admission 08/23/21 to 08/28/21. He has a PMH of hypertension, COPD, CVA in 2018 with residual left-sided weakness and bradycardia.  He presented to the hospital post fall and was noted to be getting more confused. He was diagnosed with CVA dehydration, AKI and metabolic encephalopathy. Of note, 2 days prior to hospital admission, he fell sustaining right humerus fracture and was placed in a sling.  CT scan of the head x2 and MRI brain showed nonacute changes. ?EEG done on 5/2 showed no evidence of seizure activity.  Neurology was  consulted due to presence of triphasic waves on EEG and presence of myoclonus and asterixis endings which was felt related to toxic metabolic etiology/delirium/stress from his fracture, pain and hospital stay. ?. ? ?Past Medical History:  ?Diagnosis Date  ? Aortic atherosclerosis (HCC) 06/11/2021  ? Bradycardia 06/11/2021  ? CVA (cerebral vascular accident) [facility])   ? Esophageal reflux   ? Leg fracture   ? Shortness of breath 03/03/2021  ? Skin lesion 03/03/2021  ? ?History reviewed. No pertinent surgical history. ? ?Allergies  ?Allergen Reactions  ? Benzonatate   ?  Other reaction(s): vivid dreams  ? Tamsulosin Hcl   ?  Other reaction(s): dizzy/achy  ? Septra [Sulfamethoxazole-Trimethoprim] Rash  ?  Pt states MD had him taking Sulfa by the handful when he had prostate infection, says they don't know what caused the rash  ? ? ?Outpatient Encounter Medications as of 08/31/2021  ?Medication Sig  ? acetaminophen (TYLENOL) 325 MG tablet Take 2 tablets (650 mg total) by mouth every 6 (six) hours as needed for mild pain (or Fever >/= 101).  ? albuterol (PROVENTIL HFA;VENTOLIN HFA) 108 (90 Base) MCG/ACT inhaler Inhale 2 puffs into the lungs every 6 (six) hours as needed for wheezing or shortness of breath.   ? aspirin 81 MG tablet Take 81 mg by mouth daily after lunch.   ? bisacodyl (DULCOLAX) 10 MG suppository If not relieved by MOM, give 10 mg Bisacodyl suppositiory rectally X 1 dose in 24 hours as needed  ? Cholecalciferol (VITAMIN D-3 PO) Take 1 capsule by mouth daily after lunch.   ? [  EXPIRED] DOXYCYCLINE HYCLATE PO Take by mouth. 100 MG TAB: Give one tablet by mouth twice a day x 10 days for sacral wound  ? feeding supplement (ENSURE ENLIVE / ENSURE PLUS) LIQD Take 237 mLs by mouth 3 (three) times daily between meals.  ? Lactobacillus (PROBIOTIC ACIDOPHILUS PO) Take 1 capsule by mouth daily.  ? lactose free nutrition (BOOST PLUS) LIQD Take 237 mLs by mouth 3 (three) times daily with meals.  ? Magnesium 250 MG TABS Take 250  mg by mouth daily.  ? Magnesium Hydroxide (MILK OF MAGNESIA PO) If no BM in 3 days, give 30 cc Milk of Magnesium p.o. x 1 dose in 24 hours as needed  ? melatonin 3 MG TABS tablet Take 3 mg by mouth at bedtime.  ? mirtazapine (REMERON SOL-TAB) 15 MG disintegrating tablet Take 1 tablet (15 mg total) by mouth at bedtime.  ? Multiple Vitamin (MULTIVITAMIN) tablet Take 1 tablet by mouth daily.  ? NON FORMULARY DIET: REGULAR DIET NAS HEART HEALTHY/ THIN LIQUIDS  ? Nutritional Supplements (,FEEDING SUPPLEMENT, PROSOURCE PLUS) liquid Take 30 mLs by mouth 4 (four) times daily - after meals and at bedtime.  ? pantoprazole (PROTONIX) 40 MG tablet Take 1 tablet (40 mg total) by mouth daily.  ? POLYETHYLENE GLYCOL 3350 PO Take by mouth. MIX 1 CAPFUL (17GM) WITH 4-8 OZ OF BEVERAGE OF CHOICE AND DRINK BY MOUTH ONCE DAILY FOR CONSTIPATION  ? Propylene Glycol (SYSTANE BALANCE OP) Place 1 drop into both eyes daily.  ? Sodium Phosphates (RA SALINE ENEMA RE) If not relieved by Biscodyl suppository, give disposable Saline Enema rectally X 1 dose/24 hrs as needed  ? thiamine 100 MG tablet Take 1 tablet (100 mg total) by mouth daily.  ? vitamin C (ASCORBIC ACID) 500 MG tablet Take 1,000 mg by mouth daily.  ? ?No facility-administered encounter medications on file as of 08/31/2021.  ? ? ?Review of Systems   Unable to obtain due to cognitive deficit. ? ? ? ? ?There is no immunization history on file for this patient. ?Pertinent  Health Maintenance Due  ?Topic Date Due  ? INFLUENZA VACCINE  11/24/2021  ? ? ?  08/25/2021  ? 10:00 PM 08/27/2021  ?  1:00 AM 08/27/2021  ?  8:00 AM 08/27/2021  ?  8:00 PM 08/28/2021  ?  9:00 AM  ?Fall Risk  ?Patient Fall Risk Level High fall risk High fall risk High fall risk High fall risk High fall risk  ? ? ? ?Vitals:  ? 08/31/21 0923  ?BP: (!) 138/56  ?Pulse: (!) 56  ?Resp: 18  ?Temp: (!) 97.5 ?F (36.4 ?C)  ?Weight: 115 lb 12.8 oz (52.5 kg)  ?Height: 5\' 10"  (1.778 m)  ? ?Body mass index is 16.62 kg/m?. ? ?Physical  Exam ?Constitutional:   ?   General: He is not in acute distress. ?HENT:  ?   Head: Normocephalic and atraumatic.  ?   Mouth/Throat:  ?   Mouth: Mucous membranes are moist.  ?Eyes:  ?   Conjunctiva/sclera: Conjunctivae normal.  ?Cardiovascular:  ?   Rate and Rhythm: Normal rate and regular rhythm.  ?   Pulses: Normal pulses.  ?   Heart sounds: Normal heart sounds.  ?Pulmonary:  ?   Effort: Pulmonary effort is normal.  ?   Breath sounds: Normal breath sounds.  ?Abdominal:  ?   General: Bowel sounds are normal.  ?   Palpations: Abdomen is soft.  ?Musculoskeletal:     ?  General: No swelling.  ?   Cervical back: Normal range of motion.  ?Skin: ?   General: Skin is warm and dry.  ?   Comments: Has unstageable sacral pressure ulcer.  ?Neurological:  ?   Mental Status: He is alert. Mental status is at baseline. He is disoriented.  ?   Comments: Left-sided weakness.  ?Psychiatric:     ?   Mood and Affect: Mood normal.     ?   Behavior: Behavior normal.  ?  ? ? ? ?Labs reviewed: ?Recent Labs  ?  08/25/21 ?0148 08/26/21 ?3662 08/26/21 ?9476 08/27/21 ?0710  ?NA 142 144  --  141  ?K 3.7 3.8  --  3.7  ?CL 111 109  --  105  ?CO2 23 26  --  28  ?GLUCOSE 104* 98  --  107*  ?BUN 28* 27*  --  25*  ?CREATININE 0.86 0.91  --  0.87  ?CALCIUM 7.5* 8.5*  --  8.3*  ?MG 1.7 1.7  --  1.8  ?PHOS  --   --  3.3 3.5  ? ?Recent Labs  ?  08/25/21 ?0148 08/26/21 ?5465 08/27/21 ?0710  ?AST 38 33 24  ?ALT 25 25 23   ?ALKPHOS 44 46 44  ?BILITOT 0.9 1.5* 1.2  ?PROT 4.9* 5.0* 5.0*  ?ALBUMIN 2.7* 2.6* 2.4*  ? ?Recent Labs  ?  08/25/21 ?0148 08/26/21 ?10/26/21 08/27/21 ?0710  ?WBC 7.1 6.8 6.1  ?NEUTROABS 5.5 5.3 4.6  ?HGB 9.6* 9.3* 9.3*  ?HCT 28.3* 28.3* 28.0*  ?MCV 101.4* 100.0 100.0  ?PLT 145* 159 167  ? ?Lab Results  ?Component Value Date  ? TSH 0.433 08/23/2021  ? ?No results found for: HGBA1C ?No results found for: CHOL, HDL, LDLCALC, LDLDIRECT, TRIG, CHOLHDL ? ?Significant Diagnostic Results in last 30 days:  ?DG Chest 1 View ? ?Result Date:  08/22/2021 ?CLINICAL DATA:  Status post fall. EXAM: CHEST  1 VIEW COMPARISON:  August 22, 2021 (12:22 a.m.) FINDINGS: The heart size and mediastinal contours are within normal limits. The lungs are hyperinflated. There i

## 2021-09-01 ENCOUNTER — Encounter: Payer: Self-pay | Admitting: Internal Medicine

## 2021-09-01 ENCOUNTER — Non-Acute Institutional Stay (SKILLED_NURSING_FACILITY): Payer: Medicare Other | Admitting: Internal Medicine

## 2021-09-01 ENCOUNTER — Ambulatory Visit: Payer: Medicare Other | Admitting: Physician Assistant

## 2021-09-01 DIAGNOSIS — R413 Other amnesia: Secondary | ICD-10-CM

## 2021-09-01 DIAGNOSIS — R627 Adult failure to thrive: Secondary | ICD-10-CM

## 2021-09-01 DIAGNOSIS — D539 Nutritional anemia, unspecified: Secondary | ICD-10-CM | POA: Diagnosis not present

## 2021-09-01 DIAGNOSIS — K4090 Unilateral inguinal hernia, without obstruction or gangrene, not specified as recurrent: Secondary | ICD-10-CM

## 2021-09-01 DIAGNOSIS — S42294D Other nondisplaced fracture of upper end of right humerus, subsequent encounter for fracture with routine healing: Secondary | ICD-10-CM

## 2021-09-01 DIAGNOSIS — N289 Disorder of kidney and ureter, unspecified: Secondary | ICD-10-CM

## 2021-09-01 NOTE — Progress Notes (Signed)
? ?NURSING HOME LOCATION:  [facility] ?ROOM NUMBER:  [id number] ? ?CODE STATUS:  DNR ? ?PCP:  Maury Dus MD ? ?This is a comprehensive admission note to this SNFperformed on this date less than 30 days from date of admission. ?Included are preadmission medical/surgical history; reconciled medication list; family history; social history and comprehensive review of systems.  ?Corrections and additions to the records were documented. Comprehensive physical exam was also performed. Additionally a clinical summary was entered for each active diagnosis pertinent to this admission in the Problem List to enhance continuity of care. ? ?HPI: Patient was hospitalized 4/30 - 08/28/2021 presenting after a mechanical fall with altered mental status.  2 days PTA he had a fall in which he fractured his humerus which was placed in a sling.  Subsequent to the fall and fracture he was diagnosed with severe dehydration, AKI, metabolic encephalopathy, and adult failure to thrive. ?Clinically he appeared frail and cachectic.  CT of the head was actually performed twice during hospitalization and revealed no acute changes.  EEG 5/2 revealed triphasic phase but no seizure activity.  Neurology felt that the triphasic waves on EEG with associated myoclonus and asterixis were related to toxic/metabolic etiology.  It was felt that he had multifocal delirium exacerbated by his fracture pain and medications. ?B12 and TSH were normal or therapeutic.  He was treated with stress dose thiamine; subsequently the B1 level returned and was normal.  Initial creatinine was 1.29 with a GFR of 57 indicating CKD stage IIIa.  Mild anemia was present with H/H of 12.7/37.7.  CK was 750 in the context of the recent humeral fracture.  Vitamin D level was 55.44, normal. ?Prior to discharge creatinine was 0.87 with GFR greater than 60 indicating CKD stage II.  Albumin was 2.4 and total protein 5 indicating severe malnutrition.  Final H/H was  9.3/28.0.  Glucoses while hospitalized were 98-145. ?Inguinal hernia was evaluated with CT; no incarceration was documented. ?Oral intake continue to be poor but there was slow improvement in his overall clinical picture.  He remained severely frail and deconditioned; PT/OT recommended placement for rehab.  For the anorexia mirtazapine was initiated. ?Palliative care consulted and DNR status was implemented. ? ?Past medical and surgical history: Includes aortic atherosclerosis, history of stroke, & GERD. ?No surgical history on file. ? ?Social history: Nondrinker; non-smoker. ? ?Family history: Limited history reviewed. ?  ?Review of systems: Clinical neurocognitive deficits made validity of responses questionable. The majority of the history came from his son with whom he lives.  The son states that he has been progressively weaker over the past year with slowing gait.  The son describes his memory as "hit or miss" and indicates he has had some hallucinations.  He has had decreased oral intake PTA.  He states that PTA he was "not feeling well possibly related to an intestinal issue".  He had been seen in the ED but had a 15-16-hour wait to be seen.  The son believes that the sleep deprivation and hunger led to his decompensation.  He did fall after returning home from the ED.   ?The patient's voice is almost undiscernible it is so low and weak.  He stated he was doing "pretty good".  He confabulated about having a general checkup 7 weeks ago and being told that he was in "pretty good shape".  Surprisingly the patient did give the correct month and year but could not give the day.. ? ?Physical exam:  ?Pertinent  or positive findings: He appears frankly cachectic with generalized wasting.  Hair is disheveled.  Mustache and beard are unkempt.  The voice as noted is weak and breaking.  Eyes are sunken.  The left maxillary incisor is missing.  Micrognathia is present.  First and second heart sounds are increased.  Breath  sounds are decreased.  Pedal pulses are palpable.  Heels are dressed.  Toenails are thick and distorted.  Right upper extremity is in a sling.  The left hand is deviated medially.  He has interosseous wasting of the hands and atrophic limbs.  There is splotchy hyperpigmentation over the dorsum of the feet and distal shins. ? ?General appearance:  no acute distress, increased work of breathing is present.   ?Lymphatic: No lymphadenopathy about the head, neck, axilla. ?Eyes: No conjunctival inflammation or lid edema is present. There is no scleral icterus. ?Ears:  External ear exam shows no significant lesions or deformities.   ?Nose:  External nasal examination shows no deformity or inflammation. Nasal mucosa are pink and moist without lesions, exudates ?Neck:  No thyromegaly, masses, tenderness noted.    ?Heart:  Normal rate and regular rhythm without gallop, murmur, click, rub.  ?Lungs:  without wheezes, rhonchi, rales, rubs. ?Abdomen: Bowel sounds are normal.  Abdomen is soft and nontender with no organomegaly, hernias, masses. ?GU: Deferred  ?Extremities:  No cyanosis, clubbing, edema. ?Neurologic exam:  Balance, Rhomberg, finger to nose testing could not be completed due to clinical state ?Skin: Warm & dry w/o tenting. ? ?See clinical summary under each active problem in the Problem List with associated updated therapeutic plan ? ?

## 2021-09-01 NOTE — Assessment & Plan Note (Addendum)
Initial H/H 12.7/37.5; final H/H 9.3/28.8.  B12 level normal at 478. ?Monitor for bleeding dyscrasias at SNF. ?

## 2021-09-01 NOTE — Assessment & Plan Note (Addendum)
Speech therapy and Nutritionist will consult at SNF. ? ?

## 2021-09-01 NOTE — Assessment & Plan Note (Signed)
CT revealed no incarceration and need for surgical intervention. ?

## 2021-09-01 NOTE — Assessment & Plan Note (Signed)
Sling will be continued at SNF with PT/OT as tolerated.  Orthopedic follow-up as scheduled. ?

## 2021-09-01 NOTE — Patient Instructions (Signed)
See assessment and plan under each diagnosis in the problem list and acutely for this visit 

## 2021-09-01 NOTE — Assessment & Plan Note (Signed)
Presentation creatinine 1.29 with GFR 57 indicating CKD stage IIIa.  Final creatinine 0.87 with a GFR greater than 60 indicating CKD stage II.  No indication for change in present medication regimen. ?

## 2021-09-01 NOTE — Assessment & Plan Note (Addendum)
Mental status testing pending.  Final assessment of any neurocognitive deficit will require reassessment after rehydration and nutrition interventions. ?RPR (VDRL) added for completeness. ?

## 2021-09-03 LAB — MISC LABCORP TEST (SEND OUT): Labcorp test code: 70115

## 2021-09-04 LAB — CBC: RBC: 3.13 — AB (ref 3.87–5.11)

## 2021-09-04 LAB — BASIC METABOLIC PANEL
BUN: 29 — AB (ref 4–21)
CO2: 24 — AB (ref 13–22)
Chloride: 100 (ref 99–108)
Creatinine: 0.8 (ref 0.6–1.3)
Glucose: 108
Potassium: 4.5 mEq/L (ref 3.5–5.1)
Sodium: 135 — AB (ref 137–147)

## 2021-09-04 LAB — CBC AND DIFFERENTIAL
HCT: 31 — AB (ref 41–53)
Hemoglobin: 10.6 — AB (ref 13.5–17.5)
Platelets: 481 10*3/uL — AB (ref 150–400)
WBC: 5.4

## 2021-09-04 LAB — COMPREHENSIVE METABOLIC PANEL: Calcium: 8.8 (ref 8.7–10.7)

## 2021-09-09 DIAGNOSIS — L89153 Pressure ulcer of sacral region, stage 3: Secondary | ICD-10-CM | POA: Diagnosis not present

## 2021-09-11 ENCOUNTER — Encounter: Payer: Self-pay | Admitting: Adult Health

## 2021-09-11 ENCOUNTER — Non-Acute Institutional Stay (SKILLED_NURSING_FACILITY): Payer: Medicare Other | Admitting: Adult Health

## 2021-09-11 DIAGNOSIS — Z66 Do not resuscitate: Secondary | ICD-10-CM

## 2021-09-11 DIAGNOSIS — K219 Gastro-esophageal reflux disease without esophagitis: Secondary | ICD-10-CM

## 2021-09-11 DIAGNOSIS — I6930 Unspecified sequelae of cerebral infarction: Secondary | ICD-10-CM | POA: Diagnosis not present

## 2021-09-11 DIAGNOSIS — L89150 Pressure ulcer of sacral region, unstageable: Secondary | ICD-10-CM

## 2021-09-11 DIAGNOSIS — R627 Adult failure to thrive: Secondary | ICD-10-CM | POA: Diagnosis not present

## 2021-09-11 DIAGNOSIS — S42294S Other nondisplaced fracture of upper end of right humerus, sequela: Secondary | ICD-10-CM

## 2021-09-11 NOTE — Progress Notes (Signed)
Location:  [facility] Room Number: 311-A Place of Service:  SNF (31) Provider:  Kenard Gower, DNP, FNP-BC  Patient Care Team: Elias Else, MD as PCP - General (Family Medicine) Chilton Si, MD as PCP - Cardiology (Cardiology)  Extended Emergency Contact Information Primary Emergency Contact: David Scott Address: [address]          [place], [place] [address] David Scott Home Phone: [phone] Mobile Phone: [phone] Relation: Son Secondary Emergency Contact: Booker,Jennifer Mobile Phone: [phone] Relation: Daughter  Code Status:  DNR  Goals of care: Advanced Directive information    09/11/2021   11:07 AM  Advanced Directives  Does Patient Have a Medical Advance Directive? Yes  Type of Estate agent of [place];Out of facility DNR (pink MOST or yellow form)  Does patient want to make changes to medical advance directive? No - Patient declined  Copy of Healthcare Power of Attorney in Chart? Yes - validated most recent copy scanned in chart (See row information)  Pre-existing out of facility DNR order (yellow form or pink MOST form) Yellow form placed in chart (order not valid for inpatient use)     Chief Complaint  Patient presents with   Follow-up    Short-term rehab follow-up      HPI:  Pt is a 81 y.o. male seen today for short-term rehabililitation visit. He is currently having PT, OT and ST.  Other closed nondisplaced fracture of proximal end of right humerus, sequela  -  has sling on when up/out of bed, will follow up with orthopedics  History of stroke with residual deficit -takes aspirin 81 mg 1 tab daily  Pressure injury of sacral region, unstageable (HCC) -completed doxycycline course, treated with Santyl and Daikin's solution daily  Adult failure to thrive syndrome  -  weight 117.2 lbs, gained 1.4 lbs in a week, takes Remeron 15 mg at bedtime, vitamin B1 100 mg  daily  Gastroesophageal reflux disease without esophagitis -  takes Protonix 40 mg daily    Past Medical History:  Diagnosis Date   Aortic atherosclerosis (HCC) 06/11/2021   Bradycardia 06/11/2021   CVA (cerebral vascular accident) [facility]    Esophageal reflux    Leg fracture    Shortness of breath 03/03/2021   Skin lesion 03/03/2021   History reviewed. No pertinent surgical history.  Allergies  Allergen Reactions   Benzonatate     Other reaction(s): vivid dreams   Tamsulosin Hcl     Other reaction(s): dizzy/achy   Septra [Sulfamethoxazole-Trimethoprim] Rash    Pt states MD had him taking Sulfa by the handful when he had prostate infection, says they don't know what caused the rash    Outpatient Encounter Medications as of 09/11/2021  Medication Sig   acetaminophen (TYLENOL) 325 MG tablet Take 2 tablets (650 mg total) by mouth every 6 (six) hours as needed for mild pain (or Fever >/= 101).   albuterol (PROVENTIL HFA;VENTOLIN HFA) 108 (90 Base) MCG/ACT inhaler Inhale 2 puffs into the lungs every 6 (six) hours as needed for wheezing or shortness of breath.    Amino Acids-Protein Hydrolys (FEEDING SUPPLEMENT, PRO-STAT SUGAR FREE 64,) LIQD Take 30 mLs by mouth 3 (three) times daily with meals. For wound healing   aspirin 81 MG tablet Take 81 mg by mouth daily after lunch.    bisacodyl (DULCOLAX) 10 MG suppository If not relieved by MOM, give 10 mg Bisacodyl suppositiory rectally X 1 dose in 24 hours as needed   Cholecalciferol (  VITAMIN D-3 PO) Take 1 capsule by mouth daily after lunch.    feeding supplement (ENSURE ENLIVE / ENSURE PLUS) LIQD Take 237 mLs by mouth 3 (three) times daily between meals.   Lactobacillus (PROBIOTIC ACIDOPHILUS PO) Take 1 capsule by mouth daily.   lactose free nutrition (BOOST PLUS) LIQD Take 237 mLs by mouth 3 (three) times daily with meals.   Magnesium 250 MG TABS Take 250 mg by mouth daily.   Magnesium Hydroxide (MILK OF MAGNESIA PO) If no BM in 3 days, give  30 cc Milk of Magnesium p.o. x 1 dose in 24 hours as needed   melatonin 3 MG TABS tablet Take 3 mg by mouth at bedtime.   mirtazapine (REMERON SOL-TAB) 15 MG disintegrating tablet Take 1 tablet (15 mg total) by mouth at bedtime.   Multiple Vitamin (MULTIVITAMIN) tablet Take 1 tablet by mouth daily.   NON FORMULARY DIET: REGULAR DIET NAS HEART HEALTHY/ THIN LIQUIDS   pantoprazole (PROTONIX) 40 MG tablet Take 1 tablet (40 mg total) by mouth daily.   POLYETHYLENE GLYCOL 3350 PO Take by mouth. MIX 1 CAPFUL (17GM) WITH 4-8 OZ OF BEVERAGE OF CHOICE AND DRINK BY MOUTH ONCE DAILY FOR CONSTIPATION   Propylene Glycol (SYSTANE BALANCE OP) Place 1 drop into both eyes daily.   Sodium Phosphates (RA SALINE ENEMA RE) If not relieved by Biscodyl suppository, give disposable Saline Enema rectally X 1 dose/24 hrs as needed   thiamine 100 MG tablet Take 1 tablet (100 mg total) by mouth daily.   UNABLE TO FIND Med Name: Med Pass four times daily   vitamin C (ASCORBIC ACID) 500 MG tablet Take 1,000 mg by mouth daily.   [DISCONTINUED] Nutritional Supplements (,FEEDING SUPPLEMENT, PROSOURCE PLUS) liquid Take 30 mLs by mouth 4 (four) times daily - after meals and at bedtime.   No facility-administered encounter medications on file as of 09/11/2021.    Review of Systems  Constitutional:  Negative for activity change, appetite change and fever.  HENT:  Negative for sore throat.   Eyes: Negative.   Cardiovascular:  Negative for chest pain and leg swelling.  Gastrointestinal:  Negative for abdominal distention, diarrhea and vomiting.  Genitourinary:  Negative for dysuria, frequency and urgency.  Skin:  Negative for color change.  Neurological:  Negative for dizziness and headaches.  Psychiatric/Behavioral:  Negative for behavioral problems and sleep disturbance. The patient is not nervous/anxious.        There is no immunization history on file for this patient. Pertinent  Health Maintenance Due  Topic Date Due    INFLUENZA VACCINE  11/24/2021      08/25/2021   10:00 PM 08/27/2021    1:00 AM 08/27/2021    8:00 AM 08/27/2021    8:00 PM 08/28/2021    9:00 AM  Fall Risk  Patient Fall Risk Level High fall risk High fall risk High fall risk High fall risk High fall risk     Vitals:   09/11/21 1059  BP: (!) 161/64  Pulse: 71  Resp: 20  Temp: (!) 96.4 F (35.8 C)  Weight: 117 lb 3.2 oz (53.2 kg)  Height: 5\' 10"  (1.778 m)   Body mass index is 16.82 kg/m.  Physical Exam Constitutional:      Appearance: Normal appearance.  HENT:     Head: Normocephalic and atraumatic.     Mouth/Throat:     Mouth: Mucous membranes are moist.  Eyes:     Conjunctiva/sclera: Conjunctivae normal.  Cardiovascular:  Rate and Rhythm: Normal rate and regular rhythm.     Pulses: Normal pulses.     Heart sounds: Normal heart sounds.  Pulmonary:     Effort: Pulmonary effort is normal.     Breath sounds: Normal breath sounds.  Abdominal:     General: Bowel sounds are normal.     Palpations: Abdomen is soft.  Musculoskeletal:        General: No swelling. Normal range of motion.     Cervical back: Normal range of motion.  Skin:    General: Skin is warm and dry.     Comments: Has unstageable sacral wound.  Neurological:     General: No focal deficit present.     Mental Status: He is alert.     Comments: Alert to self, disoriented to time and place.  Psychiatric:        Mood and Affect: Mood normal.        Behavior: Behavior normal.       Labs reviewed: Recent Labs    08/25/21 0148 08/26/21 0046 08/26/21 0819 08/27/21 0710 09/04/21 0000  NA 142 144  --  141 135*  K 3.7 3.8  --  3.7 4.5  CL 111 109  --  105 100  CO2 23 26  --  28 24*  GLUCOSE 104* 98  --  107*  --   BUN 28* 27*  --  25* 29*  CREATININE 0.86 0.91  --  0.87 0.8  CALCIUM 7.5* 8.5*  --  8.3* 8.8  MG 1.7 1.7  --  1.8  --   PHOS  --   --  3.3 3.5  --    Recent Labs    08/25/21 0148 08/26/21 0046 08/27/21 0710  AST 38 33 24   ALT 25 25 23   ALKPHOS 44 46 44  BILITOT 0.9 1.5* 1.2  PROT 4.9* 5.0* 5.0*  ALBUMIN 2.7* 2.6* 2.4*   Recent Labs    08/25/21 0148 08/26/21 0046 08/27/21 0710 09/04/21 0000  WBC 7.1 6.8 6.1 5.4  NEUTROABS 5.5 5.3 4.6  --   HGB 9.6* 9.3* 9.3* 10.6*  HCT 28.3* 28.3* 28.0* 31*  MCV 101.4* 100.0 100.0  --   PLT 145* 159 167 481*   Lab Results  Component Value Date   TSH 0.433 08/23/2021   No results found for: HGBA1C No results found for: CHOL, HDL, LDLCALC, LDLDIRECT, TRIG, CHOLHDL  Significant Diagnostic Results in last 30 days:  DG Chest 1 View  Result Date: 08/22/2021 CLINICAL DATA:  Status post fall. EXAM: CHEST  1 VIEW COMPARISON:  August 22, 2021 (12:22 a.m.) FINDINGS: The heart size and mediastinal contours are within normal limits. The lungs are hyperinflated. There is no evidence of an acute infiltrate, pleural effusion or pneumothorax. An acute fracture deformity is seen involving the head and surgical neck of the proximal right humerus. IMPRESSION: Acute fracture of the proximal right humerus. Electronically Signed   By: Aram Candela M.D.   On: 08/22/2021 19:41   DG Chest 2 View  Result Date: 08/22/2021 CLINICAL DATA:  Chest pain. EXAM: CHEST - 2 VIEW COMPARISON:  04/05/2021. FINDINGS: The heart size and mediastinal contours are stable. There is atherosclerotic calcification of the aorta. Hyperinflation of the lungs is noted. No consolidation, effusion, or pneumothorax. There is stable deformity of the sternum. No acute osseous abnormality. IMPRESSION: Hyperinflation of the lungs with no acute cardiopulmonary process. Electronically Signed   By: Thornell Sartorius M.D.   On:  08/22/2021 00:35   DG Pelvis 1-2 Views  Result Date: 08/22/2021 CLINICAL DATA:  Status post fall. EXAM: PELVIS - 1-2 VIEW COMPARISON:  None. FINDINGS: There is no evidence of pelvic fracture or diastasis. Radiopaque contrast from prior imaging is seen within the expected region of the urinary bladder.  A large right groin hernia is present. IMPRESSION: No acute osseous abnormality. Electronically Signed   By: Aram Candela M.D.   On: 08/22/2021 19:44   DG Shoulder Right  Result Date: 08/22/2021 CLINICAL DATA:  Status post fall. EXAM: RIGHT SHOULDER - 2+ VIEW COMPARISON:  None. FINDINGS: An acute fracture deformity is seen involving the head and surgical neck of the proximal right humerus. There is no evidence of dislocation. Moderate to marked severity degenerative changes are seen involving the right acromioclavicular joint. Soft tissues are unremarkable. IMPRESSION: Acute fracture of the proximal right humerus. Electronically Signed   By: Aram Candela M.D.   On: 08/22/2021 19:45   CT Head Wo Contrast  Result Date: 08/23/2021 CLINICAL DATA:  Recent fall, moderate to severe head trauma, altered mental status, history stroke EXAM: CT HEAD WITHOUT CONTRAST CT CERVICAL SPINE WITHOUT CONTRAST TECHNIQUE: Multidetector CT imaging of the head and cervical spine was performed following the standard protocol without intravenous contrast. Multiplanar CT image reconstructions of the cervical spine were also generated. RADIATION DOSE REDUCTION: This exam was performed according to the departmental dose-optimization program which includes automated exposure control, adjustment of the mA and/or kV according to patient size and/or use of iterative reconstruction technique. COMPARISON:  CT head 08/22/2021 FINDINGS: CT HEAD FINDINGS Brain: Generalized atrophy. Normal ventricular morphology. No midline shift or mass effect. Small vessel chronic ischemic changes of deep cerebral white matter. No intracranial hemorrhage, mass lesion, evidence of acute infarction, or extra-axial fluid collection. Vascular: No hyperdense vessels. Atherosclerotic calcification of internal carotid arteries at skull base. Skull: Intact Sinuses/Orbits: Clear Other: N/A CT CERVICAL SPINE FINDINGS Alignment: Normal Skull base and vertebrae:  Congenital fusion C2-C3. Congenital fusion atlanto-occipital joints. Anomalous C4 vertebral body, RIGHT hemivertebra LEFT fused with C3. Vertebral body heights maintained. Scattered disc space narrowing and endplate spur formation. Scattered facet degenerative changes. Encroachment upon RIGHT C4-C5 and C5-C6 neural foramina by uncovertebral spurs, less on LEFT at C4-C5, with additional facet hypertrophy impinging on foramina. No fracture, subluxation, or bone destruction. Congenital fusion anterior and lateral portions of RIGHT first and second ribs. Congenital fusion T2-T3, with additional fusion of RIGHT facet joint T1-T2. Soft tissues and spinal canal: Prevertebral soft tissues normal thickness. Scattered atherosclerotic calcifications at carotid bifurcations. Disc levels:  No focal abnormality Upper chest: Lung apices clear Other: N/A IMPRESSION: Atrophy with small vessel chronic ischemic changes of deep cerebral white matter. No acute intracranial abnormalities. Multilevel degenerative disc and facet disease changes of the cervical spine. 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 No acute cervical spine abnormalities. Electronically Signed   By: Ulyses Southward M.D.   On: 08/23/2021 11:11   CT HEAD WO CONTRAST  Result Date: 08/22/2021 CLINICAL DATA:  Status post trauma. EXAM: CT HEAD WITHOUT CONTRAST TECHNIQUE: Contiguous axial images were obtained from the base of the skull through the vertex without intravenous contrast. RADIATION DOSE REDUCTION: This exam was performed according to the departmental dose-optimization program which includes automated exposure control, adjustment of the mA and/or kV according to patient size and/or use of iterative reconstruction technique. COMPARISON:  August 22, 2021 FINDINGS: Brain: There is mild cerebral atrophy with widening of the extra-axial spaces  and ventricular dilatation. There are areas of decreased attenuation within the  white matter tracts of the supratentorial brain, consistent with microvascular disease changes. Vascular: No hyperdense vessel or unexpected calcification. Skull: Normal. Negative for fracture or focal lesion. Sinuses/Orbits: No acute finding. Other: None. IMPRESSION: 1. Generalized cerebral atrophy and chronic white matter small vessel ischemic changes. 2. No acute intracranial abnormality. Electronically Signed   By: Aram Candela M.D.   On: 08/22/2021 19:32   CT Head Wo Contrast  Result Date: 08/22/2021 CLINICAL DATA:  Delirium. EXAM: CT HEAD WITHOUT CONTRAST TECHNIQUE: Contiguous axial images were obtained from the base of the skull through the vertex without intravenous contrast. RADIATION DOSE REDUCTION: This exam was performed according to the departmental dose-optimization program which includes automated exposure control, adjustment of the mA and/or kV according to patient size and/or use of iterative reconstruction technique. COMPARISON:  11/16/2020 FINDINGS: Brain: There is no evidence for acute hemorrhage, hydrocephalus, mass lesion, or abnormal extra-axial fluid collection. No definite CT evidence for acute infarction. Diffuse loss of parenchymal volume is consistent with atrophy. Vascular: No hyperdense vessel or unexpected calcification. Skull: No evidence for fracture. No worrisome lytic or sclerotic lesion. Sinuses/Orbits: Paranasal sinuses are clear.Visualized portions of the globes and intraorbital fat are unremarkable. Other: None. IMPRESSION: 1. Stable.  No acute intracranial abnormality. Electronically Signed   By: Kennith Center M.D.   On: 08/22/2021 09:25   CT Cervical Spine Wo Contrast  Result Date: 08/23/2021 CLINICAL DATA:  Recent fall, moderate to severe head trauma, altered mental status, history stroke EXAM: CT HEAD WITHOUT CONTRAST CT CERVICAL SPINE WITHOUT CONTRAST TECHNIQUE: Multidetector CT imaging of the head and cervical spine was performed following the standard  protocol without intravenous contrast. Multiplanar CT image reconstructions of the cervical spine were also generated. RADIATION DOSE REDUCTION: This exam was performed according to the departmental dose-optimization program which includes automated exposure control, adjustment of the mA and/or kV according to patient size and/or use of iterative reconstruction technique. COMPARISON:  CT head 08/22/2021 FINDINGS: CT HEAD FINDINGS Brain: Generalized atrophy. Normal ventricular morphology. No midline shift or mass effect. Small vessel chronic ischemic changes of deep cerebral white matter. No intracranial hemorrhage, mass lesion, evidence of acute infarction, or extra-axial fluid collection. Vascular: No hyperdense vessels. Atherosclerotic calcification of internal carotid arteries at skull base. Skull: Intact Sinuses/Orbits: Clear Other: N/A CT CERVICAL SPINE FINDINGS Alignment: Normal Skull base and vertebrae: Congenital fusion C2-C3. Congenital fusion atlanto-occipital joints. Anomalous C4 vertebral body, RIGHT hemivertebra LEFT fused with C3. Vertebral body heights maintained. Scattered disc space narrowing and endplate spur formation. Scattered facet degenerative changes. Encroachment upon RIGHT C4-C5 and C5-C6 neural foramina by uncovertebral spurs, less on LEFT at C4-C5, with additional facet hypertrophy impinging on foramina. No fracture, subluxation, or bone destruction. Congenital fusion anterior and lateral portions of RIGHT first and second ribs. Congenital fusion T2-T3, with additional fusion of RIGHT facet joint T1-T2. Soft tissues and spinal canal: Prevertebral soft tissues normal thickness. Scattered atherosclerotic calcifications at carotid bifurcations. Disc levels:  No focal abnormality Upper chest: Lung apices clear Other: N/A IMPRESSION: Atrophy with small vessel chronic ischemic changes of deep cerebral white matter. No acute intracranial abnormalities. Multilevel degenerative disc and facet  disease changes of the cervical spine. 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 No acute cervical spine abnormalities. Electronically Signed   By: Ulyses Southward M.D.   On: 08/23/2021 11:11   CT CERVICAL SPINE WO CONTRAST  Result Date: 08/22/2021 CLINICAL DATA:  Status post trauma. EXAM: CT CERVICAL SPINE WITHOUT CONTRAST TECHNIQUE: Multidetector CT imaging of the cervical spine was performed without intravenous contrast. Multiplanar CT image reconstructions were also generated. RADIATION DOSE REDUCTION: This exam was performed according to the departmental dose-optimization program which includes automated exposure control, adjustment of the mA and/or kV according to patient size and/or use of iterative reconstruction technique. COMPARISON:  None. FINDINGS: Alignment: There is straightening of the normal cervical spine lordosis. Skull base and vertebrae: No acute fracture. Chronic and degenerative changes are seen involving the body and tip of the dens. Soft tissues and spinal canal: No prevertebral fluid or swelling. No visible canal hematoma. Disc levels: Moderate to marked severity endplate sclerosis and posterior bony spurs are seen at the levels of C3-C4, C4-C5, C5-C6 and C6-C7. There is marked severity narrowing of the anterior atlantoaxial articulation. Fusion of the C2 and C3 vertebral bodies is noted. There is marked severity intervertebral disc space narrowing at the level of C6-C7 with mild to moderate severity intervertebral disc space narrowing seen throughout the remainder of the cervical spine. Bilateral marked severity multilevel facet joint hypertrophy is noted Upper chest: Moderate severity posterior biapical scarring and/or atelectasis is seen. Other: None. IMPRESSION: 1. No acute fracture or subluxation of the cervical spine. 2. Fusion of the C2 and C3 vertebral bodies. 3. Moderate severity multilevel degenerative changes, as described above. 4.  Moderate severity posterior biapical scarring and/or atelectasis. Electronically Signed   By: Aram Candela M.D.   On: 08/22/2021 19:39   MR BRAIN WO CONTRAST  Result Date: 08/23/2021 CLINICAL DATA:  Delirium EXAM: MRI HEAD WITHOUT CONTRAST TECHNIQUE: Multiplanar, multiecho pulse sequences of the brain and surrounding structures were obtained without intravenous contrast. COMPARISON:  October 2018 FINDINGS: Brain: There is no acute infarction or intracranial hemorrhage. There is no intracranial mass, mass effect, or edema. There is no hydrocephalus or extra-axial fluid collection. Prominence of the ventricles and sulci reflects generalized parenchymal volume loss. Patchy T2 hyperintensity in the supratentorial white matter is nonspecific but may reflect mild chronic microvascular ischemic changes. Left anterior inferior frontal encephalomalacia. Small chronic right cerebellar infarcts. Punctate focus susceptibility in the left parietal white matter likely reflects chronic microhemorrhage. Vascular: Major vessel flow voids at the skull base are preserved. Skull and upper cervical spine: Normal marrow signal is preserved. Sinuses/Orbits: Mild mucosal thickening.  Orbits are unremarkable. Other: Sella is unremarkable.  Mastoid air cells are clear. IMPRESSION: No evidence of recent infarction, hemorrhage, or mass. Similar chronic/nonemergent findings detailed above. Electronically Signed   By: Guadlupe Spanish M.D.   On: 08/23/2021 17:20   CT ABDOMEN PELVIS W CONTRAST  Result Date: 08/22/2021 CLINICAL DATA:  Abdominal pain.  Nonlocalized. EXAM: CT ABDOMEN AND PELVIS WITH CONTRAST TECHNIQUE: Multidetector CT imaging of the abdomen and pelvis was performed using the standard protocol following bolus administration of intravenous contrast. RADIATION DOSE REDUCTION: This exam was performed according to the departmental dose-optimization program which includes automated exposure control, adjustment of the mA and/or  kV according to patient size and/or use of iterative reconstruction technique. CONTRAST:  80mL OMNIPAQUE IOHEXOL 350 MG/ML SOLN COMPARISON:  12/27/2020 FINDINGS: Lower chest: Unremarkable Hepatobiliary: No suspicious focal abnormality within the liver parenchyma. There is no evidence for gallstones, gallbladder wall thickening, or pericholecystic fluid. No intrahepatic or extrahepatic biliary dilation. Pancreas: No focal mass lesion. No dilatation of the main duct. No intraparenchymal cyst. No peripancreatic edema. Spleen: No splenomegaly. No focal mass lesion. Adrenals/Urinary Tract: No adrenal nodule or mass. Kidneys unremarkable.  No evidence for hydroureter. The urinary bladder appears normal for the degree of distention. Stomach/Bowel: Stomach is unremarkable. No gastric wall thickening. No evidence of outlet obstruction. Duodenum is normally positioned as is the ligament of Treitz. No small bowel wall thickening. No small bowel dilatation. Large right groin hernia contains small bowel and colon without evidence for overt obstruction Diverticular changes are noted in the left colon without evidence of diverticulitis. Vascular/Lymphatic: No abdominal aortic aneurysm. There is no gastrohepatic or hepatoduodenal ligament lymphadenopathy. No retroperitoneal or mesenteric lymphadenopathy. No pelvic sidewall lymphadenopathy. Reproductive: Prostate gland is enlarged. Polypoid soft tissue projecting into the posterior bladder likely related to median lobe hypertrophy Other: No intraperitoneal free fluid. Musculoskeletal: No worrisome lytic or sclerotic osseous abnormality. Thoracolumbar scoliosis evident IMPRESSION: 1. No acute findings in the abdomen or pelvis. 2. Large right groin hernia contains small bowel and colon without evidence for overt obstruction. 3. Left colonic diverticulosis without diverticulitis. 4. Prostatomegaly with evidence of median lobe hypertrophy. Electronically Signed   By: Kennith Center M.D.    On: 08/22/2021 09:23   DG Pelvis Portable  Result Date: 08/23/2021 CLINICAL DATA:  Recent shoulder injury, new fall EXAM: PORTABLE PELVIS 1-2 VIEWS COMPARISON:  Portable exam 1030 hours compared to 08/22/2021 FINDINGS: Osseous demineralization. Hip and SI joint spaces preserved. No acute fracture, dislocation, or bone destruction. Degenerative changes and scoliosis lumbar spine. RIGHT inguinal hernia containing bowel which extends into the RIGHT scrotum. Excreted contrast material within urinary bladder. IMPRESSION: No acute osseous abnormalities. RIGHT inguinal hernia containing bowel. Electronically Signed   By: Ulyses Southward M.D.   On: 08/23/2021 10:59   DG Chest Port 1 View  Result Date: 08/25/2021 CLINICAL DATA:  Shortness of breath.  08/23/2021 EXAM: PORTABLE CHEST 1 VIEW COMPARISON:  08/23/2021 FINDINGS: Stable cardiomediastinal contours. No pleural effusion or edema identified. No airspace opacities. Aortic atherosclerotic calcifications. Proximal right humerus fracture is again noted. IMPRESSION: No acute cardiopulmonary abnormalities. Aortic Atherosclerosis (ICD10-I70.0). Electronically Signed   By: Signa Kell M.D.   On: 08/25/2021 06:37   DG Chest Portable 1 View  Result Date: 08/23/2021 CLINICAL DATA:  Fall, recent shoulder injury, altered mental status EXAM: PORTABLE CHEST 1 VIEW COMPARISON:  Portable exam 1031 hours compared to 08/22/2021 FINDINGS: LEFT lung base excluded. Normal heart size, mediastinal contours, and pulmonary vascularity. Atherosclerotic calcification aorta. Lungs clear. No gross pleural effusion or pneumothorax. Bones demineralized with fracture again identified at surgical neck RIGHT humerus. IMPRESSION: No acute abnormalities. Proximal RIGHT humeral fracture. Aortic Atherosclerosis (ICD10-I70.0). Electronically Signed   By: Ulyses Southward M.D.   On: 08/23/2021 11:00   DG Shoulder Right Portable  Result Date: 08/23/2021 CLINICAL DATA:  Fall.  Recent shoulder injury.  EXAM: RIGHT SHOULDER - 1 VIEW COMPARISON:  08/22/2021 FINDINGS: Comminuted right humeral head/surgical neck fracture is similar to prior without substantial interval change in alignment or position of fracture fragments. Assessment is limited by slightly different positioning on both studies. Bones are diffusely demineralized. IMPRESSION: Comminuted right humeral head/surgical neck fracture without substantial interval change. Electronically Signed   By: Kennith Center M.D.   On: 08/23/2021 10:59   EEG adult  Result Date: 08/25/2021 Charlsie Quest, MD     08/25/2021  2:03 PM Patient Name: David Scott MRN: [id number] Epilepsy Attending: Charlsie Quest Referring Physician/Provider: Leroy Sea, MD Date: 08/25/2021 Duration: 23.29 mins Patient history: 81 year old male with altered mental status and intermittent myoclonic jerks.  EEG to evaluate for seizure Level of alertness: Awake AEDs during  EEG study: None Technical aspects: This EEG study was done with scalp electrodes positioned according to the 10-20 International system of electrode placement. Electrical activity was acquired at a sampling rate of 500Hz  and reviewed with a high frequency filter of 70Hz  and a low frequency filter of 1Hz . EEG data were recorded continuously and digitally stored. Description: No clear posterior dominant rhythm was seen.  EEG showed continuous generalized 3 to 7 Hz theta-delta slowing.  Intermittent generalized periodic discharges with triphasic morphology were also noted at 1.5 to 2 Hz.  Hyperventilation and photic stimulation were not performed.   ABNORMALITY - Periodic discharges with triphasic morphology, generalized ( GPDs) - Continuous slow, generalized IMPRESSION: This study showed generalized periodic discharges with triphasic morphology. This EEG pattern can be on the ictal-interictal continuum.  However the morphology, frequency and reactivity to stimulation is most likely due to toxic-metabolic causes.   Additionally, the study is suggestive of moderate diffuse encephalopathy, nonspecific etiology. No seizures were seen throughout the recording. Priyanka O Yadav   LONG TERM MONITOR (3-14 DAYS)  Result Date: 08/16/2021 14 Day Zio Monitor Quality: Fair.  Baseline artifact. Predominant rhythm: sinus bradycardia Average heart rate: 56 bpm Max heart rate: 85 bpm sinus rhythm.  139 bpm overall. Min heart rate: 35 bpm Pauses >2.5 seconds: none 14 runs of SVT up to 19 beats Rare PACs and PVCs Ventricular bigeminy and trigeminy present Tiffany C. , MD, [facility] 08/16/2021 10:16 AM   Assessment/Plan  1. Other closed nondisplaced fracture of proximal end of right humerus, sequela -  will follow up with orthopedics -   continue sling  2. History of stroke with residual deficit -  stable, continue ASA -  continue PT, OT and ST  3. Pressure injury of sacral region, unstageable (HCC) -  continue wound treatment daily  4. Adult failure to thrive syndrome Wt Readings from Last 3 Encounters:  09/11/21 117 lb 3.2 oz (53.2 kg)  09/01/21 124 lb (56.2 kg)  08/31/21 115 lb 12.8 oz (52.5 kg)   -  continue Remeron and Vitamin B1  5. Gastroesophageal reflux disease without esophagitis -  stable, continue Protonix    Family/ staff Communication: Discussed plan of care with resident and charge nurse.  Labs/tests ordered:   None    09/13/21, DNP, MSN, FNP-BC [facility] [phone] (Monday-Friday 8:00 a.m. - 5:00 p.m.) [phone] (after hours)

## 2021-09-23 ENCOUNTER — Non-Acute Institutional Stay (SKILLED_NURSING_FACILITY): Payer: Medicare Other | Admitting: Internal Medicine

## 2021-09-23 ENCOUNTER — Encounter: Payer: Self-pay | Admitting: Internal Medicine

## 2021-09-23 DIAGNOSIS — S51011A Laceration without foreign body of right elbow, initial encounter: Secondary | ICD-10-CM | POA: Diagnosis not present

## 2021-09-23 DIAGNOSIS — R296 Repeated falls: Secondary | ICD-10-CM

## 2021-09-23 DIAGNOSIS — B372 Candidiasis of skin and nail: Secondary | ICD-10-CM | POA: Diagnosis not present

## 2021-09-23 NOTE — Patient Instructions (Signed)
See assessment and plan under each diagnosis in the problem list and acutely for this visit 

## 2021-09-23 NOTE — Assessment & Plan Note (Signed)
Bed is placed at its lowest height.  Unfortunately his dementia precludes him understanding the risk of trying to ambulate without help, especially with the RUE in a sling. This was discussed with his daughter.  I explained that we cannot physically or medically restrain him.  I encouraged the family to be with him as much as possible.

## 2021-09-23 NOTE — Progress Notes (Signed)
   NURSING HOME LOCATION:  [facility] ROOM NUMBER:  [id number]  CODE STATUS:  DNR  PCP:  Maury Dus MD  This is a nursing facility follow up visit for specific acute issue of anterior chest rash and elbow skin tear after apparent unwitnessed fall from bed.  Interim medical record and care since last SNF visit was updated with review of diagnostic studies and change in clinical status since last visit were documented.  HPI: He was admitted to this facility 08/28/2021 following a hospitalization 4/30 - 5/5 for severe dehydration, AKI, metabolic encephalopathy, adult failure to thrive after having falling 4/28 sustaining fracture of the humerus.  With aggressive treatment CKD improved to stage II.  Severe malnutrition was documented by physical findings as well as albumin 2.4 and total protein of 5. He was discharged here for rehab.  Nursing staff found the resident sitting on the floor at 0500 this morning.  Skin tear was noted over the right elbow.  Patient denied any significant pain or head injury.  Dressing was applied to the right elbow.  Review of systems: Dementia invalidated responses.  When I asked him about being found on the floor; he began to confabulate nonsensically about "someone holding his hand".  His daughter was visiting him when I entered the room.  He could not give me the present year nor his daughter's birthdate.  Physical exam:  Pertinent or positive findings: His bed is at its lowest possible height.  He appears grossly cachectic.  Hair is combed but still somewhat disheveled.  Facies are masked.  He is missing an anterior maxillary incisor.  Breath sounds are decreased.  He has a grade 1 systolic murmur with increased S2.  Pectus excavatum is present.  Dorsalis pedis pulses are stronger than posterior tibial pulses.  He has trace edema at the sock line.  Right elbow is dressed.  The right upper extremity is in a sling.  Left thumb is surgically absent.   Interosseous wasting of the hands is present.  Limb atrophy is present.  He has slightly erythematous papular dermatitis over the mid chest which suggests Candida.  General appearance: no acute distress, increased work of breathing is present.   Lymphatic: No lymphadenopathy about the head, neck, axilla. Eyes: No conjunctival inflammation or lid edema is present. There is no scleral icterus. Ears:  External ear exam shows no significant lesions or deformities.   Nose:  External nasal examination shows no deformity or inflammation. Nasal mucosa are pink and moist without lesions, exudates Neck:  No thyromegaly, masses, tenderness noted.    Heart:  Normal rate and regular rhythm. S1 normal without gallop, click, rub .  Lungs:  without wheezes, rhonchi, rales, rubs. Abdomen: Bowel sounds are normal. Abdomen is soft and nontender with no organomegaly, hernias, masses. GU: Deferred  Extremities:  No cyanosis, clubbing Neurologic exam :Balance, Rhomberg, finger to nose testing could not be completed due to clinical state Skin: Warm & dry w/o tenting.  See summary under each active problem in the Problem List with associated updated therapeutic plan

## 2021-09-24 DIAGNOSIS — M25511 Pain in right shoulder: Secondary | ICD-10-CM | POA: Diagnosis not present

## 2021-09-24 DIAGNOSIS — S42201A Unspecified fracture of upper end of right humerus, initial encounter for closed fracture: Secondary | ICD-10-CM | POA: Diagnosis not present

## 2021-09-30 ENCOUNTER — Ambulatory Visit (INDEPENDENT_AMBULATORY_CARE_PROVIDER_SITE_OTHER): Payer: Medicare Other | Admitting: Cardiovascular Disease

## 2021-09-30 ENCOUNTER — Encounter (HOSPITAL_BASED_OUTPATIENT_CLINIC_OR_DEPARTMENT_OTHER): Payer: Self-pay | Admitting: Cardiovascular Disease

## 2021-09-30 VITALS — BP 120/60 | HR 66 | Ht 70.0 in | Wt 115.0 lb

## 2021-09-30 DIAGNOSIS — I700 Atherosclerosis of aorta: Secondary | ICD-10-CM | POA: Diagnosis not present

## 2021-09-30 DIAGNOSIS — I639 Cerebral infarction, unspecified: Secondary | ICD-10-CM | POA: Diagnosis not present

## 2021-09-30 DIAGNOSIS — R5383 Other fatigue: Secondary | ICD-10-CM

## 2021-09-30 DIAGNOSIS — R627 Adult failure to thrive: Secondary | ICD-10-CM

## 2021-09-30 DIAGNOSIS — I10 Essential (primary) hypertension: Secondary | ICD-10-CM | POA: Diagnosis not present

## 2021-09-30 DIAGNOSIS — R001 Bradycardia, unspecified: Secondary | ICD-10-CM

## 2021-09-30 HISTORY — DX: Other fatigue: R53.83

## 2021-09-30 NOTE — Assessment & Plan Note (Signed)
[name] was very lethargic today.  He was also warm to touch.  His son notes that his facility checked labs today and he was negative for COVID-19.  Other lab work is pending.  Including a urinalysis.  His lungs are clear so we will not get a chest x-ray.  Further work-up per his nursing home MD.

## 2021-09-30 NOTE — Assessment & Plan Note (Signed)
Continue aspirin.  Check fasting lipids and CMP at his nursing facility.

## 2021-09-30 NOTE — Assessment & Plan Note (Signed)
Check fasting lipids/CMP.  Continue aspirin.

## 2021-09-30 NOTE — Assessment & Plan Note (Signed)
Asymptomatic.  He is not bradycardic today.  Avoid nodal agents though he did also have some evidence of SVT and PVCs on his monitor.

## 2021-09-30 NOTE — Progress Notes (Signed)
Cardiology Office Note:    Date:  09/30/2021   ID:  David Scott, DOB [birth year], MRN [id number]  PCP:  Elias Else, MD   [facility] Providers Cardiologist:  Chilton Si, MD     Referring MD: Elias Else, MD   No chief complaint on file.   History of Present Illness:    David Scott is a 81 y.o. male with a hx of asthma, COPD, hypertension, aortic atherosclerosis, and GERD, here for follow-up. He was initially seen 03/03/2021 for the evaluation of shortness of breath and edema. He saw his PCP on 11/2020 and reported feeling more short of breath. He had no edema on exam. His combivent inhaler was refilled and he was referred to cardiology. At a prior visit 10/2020 he had symptomatic bradycardia and left-sided chest pain. He had a heart rate of 53 on EKG. They recommended he go to the ED but he refused.  He reported SOB and had LE edema. BNP was mildly elevated so furosemide was increased to 20 mg every day. He had an echo that revealed 60-65% with mild LVH and normal diastolic function. Right atrial pressure was 3 mm Hg. He was noted to have increased trabeculation in the LV Apex. He was seen at the hospital 03/2021 with chest pain that awoke him from sleep . His BP in the ED was 184/77. Troponin was unremarkable and BNP was mildly elevated at 164. Labs were unremarkable and he was discharged home.  At his last appointment, his blood pressure had been elevated about half the time at his home. His breathing was stable and he endorsed some lightheadedness and dizziness. A Zio monitor was ordered 06/2021 that showed rare PVC's and PAC's with up to 9 beats of SVT and 27 seconds of ventricular trigeminy. His minimum heart rate was 35 bpm. He presented to the ED on 08/22/2021 due to a fall trying to get out of his car. Workup revealed nondisplaced proximal humeral fracture, negative pneumothorax and no intracranial hemorrhage. He was placed in a sling and recommended outpatient follow  up. He was admitted 08/23/2021 for altered mental status, severe dehydration, AKI, and metabolic encephalopathy. He was treated with stress dose thiamine, and discharged on thiamine supplement. Neurology was consulted given presence of triphasic waves on EEG, and presence of myoclonus and asterixis. It was thought these findings wre likely related to multifocal delirium initially participated through histological stress from his fracture, pain, and hospital stay related delirium. Planned to discharge to subacute rehab.   Today, he is accompanied by his son. He states he is doing good with no new complaints. He claims his breathing is doing well. He denies having any pains or dysuria.  However, his son has been told by the nursing home staff that he has not been doing as well in the past 1-2 weeks. They have drawn blood for labs with pending results. He recently had a precautionary COVID test that was negative. His blood pressure is 161/76, and 120/60 on recheck. Although he is taking medication to increase his appetite, his son noted that his appetite lately has been "hit or miss." He has been taking an exercise class that makes him feel sleepy afterwards. He feels as if he should be getting more work. He denies any palpitations, chest pain, or peripheral edema. No lightheadedness, headaches, syncope, orthopnea, or PND.    Past Medical History:  Diagnosis Date   Aortic atherosclerosis (HCC) 06/11/2021   Bradycardia 06/11/2021   CVA (cerebral vascular accident) (  HCC)    Esophageal reflux    Leg fracture    Lethargy 09/30/2021   Shortness of breath 03/03/2021   Skin lesion 03/03/2021    History reviewed. No pertinent surgical history.  Current Medications: Current Meds  Medication Sig   acetaminophen (TYLENOL) 325 MG tablet Take 2 tablets (650 mg total) by mouth every 6 (six) hours as needed for mild pain (or Fever >/= 101).   albuterol (PROVENTIL HFA;VENTOLIN HFA) 108 (90 Base) MCG/ACT inhaler Inhale  2 puffs into the lungs every 6 (six) hours as needed for wheezing or shortness of breath.    Amino Acids-Protein Hydrolys (FEEDING SUPPLEMENT, PRO-STAT SUGAR FREE 64,) LIQD Take 30 mLs by mouth 3 (three) times daily with meals. For wound healing   aspirin 81 MG tablet Take 81 mg by mouth daily after lunch.    bisacodyl (DULCOLAX) 10 MG suppository If not relieved by MOM, give 10 mg Bisacodyl suppositiory rectally X 1 dose in 24 hours as needed   Cholecalciferol (VITAMIN D-3 PO) Take 1 capsule by mouth daily after lunch.    feeding supplement (ENSURE ENLIVE / ENSURE PLUS) LIQD Take 237 mLs by mouth 3 (three) times daily between meals.   Lactobacillus (PROBIOTIC ACIDOPHILUS PO) Take 1 capsule by mouth daily.   lactose free nutrition (BOOST PLUS) LIQD Take 237 mLs by mouth 3 (three) times daily with meals.   Magnesium 250 MG TABS Take 250 mg by mouth daily.   Magnesium Hydroxide (MILK OF MAGNESIA PO) If no BM in 3 days, give 30 cc Milk of Magnesium p.o. x 1 dose in 24 hours as needed   melatonin 3 MG TABS tablet Take 3 mg by mouth at bedtime.   mirtazapine (REMERON SOL-TAB) 15 MG disintegrating tablet Take 1 tablet (15 mg total) by mouth at bedtime.   Multiple Vitamin (MULTIVITAMIN) tablet Take 1 tablet by mouth daily.   NON FORMULARY DIET: REGULAR DIET NAS HEART HEALTHY/ THIN LIQUIDS   pantoprazole (PROTONIX) 40 MG tablet Take 1 tablet (40 mg total) by mouth daily.   POLYETHYLENE GLYCOL 3350 PO Take by mouth. MIX 1 CAPFUL (17GM) WITH 4-8 OZ OF BEVERAGE OF CHOICE AND DRINK BY MOUTH ONCE DAILY FOR CONSTIPATION   Propylene Glycol (SYSTANE BALANCE OP) Place 1 drop into both eyes daily.   Sodium Phosphates (RA SALINE ENEMA RE) If not relieved by Biscodyl suppository, give disposable Saline Enema rectally X 1 dose/24 hrs as needed   thiamine 100 MG tablet Take 1 tablet (100 mg total) by mouth daily.   UNABLE TO FIND Med Name: Med Pass four times daily   vitamin C (ASCORBIC ACID) 500 MG tablet Take 1,000  mg by mouth daily.     Allergies:   Benzonatate, Tamsulosin hcl, and Septra [sulfamethoxazole-trimethoprim]   Social History   Socioeconomic History   Marital status: Single    Spouse name: Not on file   Number of children: Not on file   Years of education: Not on file   Highest education level: Not on file  Occupational History   Not on file  Tobacco Use   Smoking status: Never   Smokeless tobacco: Never  Vaping Use   Vaping Use: Never used  Substance and Sexual Activity   Alcohol use: No   Drug use: No   Sexual activity: Not on file  Other Topics Concern   Not on file  Social History Narrative   Not on file   Social Determinants of Health   Financial Resource Strain:  Not on file  Food Insecurity: Not on file  Transportation Needs: Not on file  Physical Activity: Not on file  Stress: Not on file  Social Connections: Not on file     Family History: The patient's family history includes COPD in his father.  ROS:   Please see the history of present illness.    All other systems reviewed and are negative.  EKGs/Labs/Other Studies Reviewed:    The following studies were reviewed today:  MRI Brain 08/23/2021: Brain: There is no acute infarction or intracranial hemorrhage. There is no intracranial mass, mass effect, or edema. There is no hydrocephalus or extra-axial fluid collection.   Prominence of the ventricles and sulci reflects generalized parenchymal volume loss. Patchy T2 hyperintensity in the supratentorial white matter is nonspecific but may reflect mild chronic microvascular ischemic changes. Left anterior inferior frontal encephalomalacia. Small chronic right cerebellar infarcts. Punctate focus susceptibility in the left parietal white matter likely reflects chronic microhemorrhage.   Vascular: Major vessel flow voids at the skull base are preserved.   Skull and upper cervical spine: Normal marrow signal is preserved.   Sinuses/Orbits: Mild  mucosal thickening.  Orbits are unremarkable.   Other: Sella is unremarkable.  Mastoid air cells are clear.   IMPRESSION: No evidence of recent infarction, hemorrhage, or mass.   Similar chronic/nonemergent findings detailed above.  Monitor 07/2021: 14 Day Zio Monitor   Quality: Fair.  Baseline artifact. Predominant rhythm: sinus bradycardia Average heart rate: 56 bpm Max heart rate: 85 bpm sinus rhythm.  139 bpm overall. Min heart rate: 35 bpm Pauses >2.5 seconds: none   14 runs of SVT up to 19 beats Rare PACs and PVCs Ventricular bigeminy and trigeminy present  Echo 03/16/2021: Sonographer Comments: Technically difficult study due to poor echo  windows. Image acquisition challenging due to patient body habitus and  pectum excavatum with kyphosis.  IMPRESSIONS    1. Left ventricular ejection fraction, by estimation, is 60 to 65%. The  left ventricle has normal function. The left ventricle has no regional  wall motion abnormalities. There is mild left ventricular hypertrophy.  Left ventricular diastolic parameters  were normal. Apical hypertrabeculation.   2. Right ventricular systolic function is normal. The right ventricular  size is normal. Tricuspid regurgitation signal is inadequate for assessing  PA pressure.   3. The mitral valve is normal in structure. Mild mitral valve  regurgitation. No evidence of mitral stenosis.   4. The aortic valve was not well visualized. Aortic valve regurgitation  is mild. No aortic stenosis is present.   5. The inferior vena cava is normal in size with greater than 50%  respiratory variability, suggesting right atrial pressure of 3 mmHg.   Comparison(s): EF 55%.    EKG:   EKG was personally reviewed. 09/30/2021: EKG was not ordered. 06/11/2021: EKG was not ordered. 03/03/2021: EKG was not ordered.  Recent Labs: 08/23/2021: TSH 0.433 08/27/2021: ALT 23; B Natriuretic Peptide 627.2; Magnesium 1.8 09/04/2021: BUN 29; Creatinine 0.8;  Hemoglobin 10.6; Platelets 481; Potassium 4.5; Sodium 135   Recent Lipid Panel No results found for: CHOL, TRIG, HDL, CHOLHDL, VLDL, LDLCALC, LDLDIRECT       Physical Exam:    Wt Readings from Last 3 Encounters:  09/30/21 115 lb (52.2 kg)  09/11/21 117 lb 3.2 oz (53.2 kg)  09/01/21 124 lb (56.2 kg)     VS:  BP 120/60 (BP Location: Right Arm, Patient Position: Sitting, Cuff Size: Small)   Pulse 66   Ht 5'  10" (1.778 m)   Wt 115 lb (52.2 kg)   BMI 16.50 kg/m  , BMI Body mass index is 16.5 kg/m. GENERAL:  Ill-appearing.  Lethargic.  Answers questions appropriately but slowly.  Frail.  In wheelchair HEENT: Pupils equal round and reactive, fundi not visualized, oral mucosa unremarkable NECK:  No jugular venous distention, waveform within normal limits, carotid upstroke brisk and symmetric, no bruits, no thyromegaly.  LUNGS:  Clear to auscultation bilaterally HEART:  RRR.  PMI not displaced or sustained,S1 and S2 within normal limits, no S3, no S4, no clicks, no rubs, No murmurs ABD:  Flat, positive bowel sounds normal in frequency in pitch, no bruits, no rebound, no guarding, no midline pulsatile mass, no hepatomegaly, no splenomegaly EXT:  2 plus pulses throughout, no edema, no cyanosis no clubbing SKIN:  No rashes, 1 cm nodule on left neck  NEURO:  Cranial nerves II through XII grossly intact, motor grossly intact throughout PSYCH:  Cognitively intact, oriented to person place and time   ASSESSMENT:    1. Essential hypertension   2. Aortic atherosclerosis (HCC)   3. Cerebrovascular accident (CVA), unspecified mechanism (HCC)   4. Lethargy   5. Bradycardia   6. Adult failure to thrive syndrome      PLAN:    Aortic atherosclerosis (HCC) Continue aspirin.  Check fasting lipids and CMP at his nursing facility.  Stroke [facility]) Check fasting lipids/CMP.  Continue aspirin.  Lethargy Mr. Markham Jordan was very lethargic today.  He was also warm to touch.  His son notes that his  facility checked labs today and he was negative for COVID-19.  Other lab work is pending.  Including a urinalysis.  His lungs are clear so we will not get a chest x-ray.  Further work-up per his nursing home MD.  Bradycardia Asymptomatic.  He is not bradycardic today.  Avoid nodal agents though he did also have some evidence of SVT and PVCs on his monitor.  Adult failure to thrive syndrome He continues to lose weight despite adding mirtazepine.  Avoid diuretics if his BP does continue to be elevated and he needs medication.    Disposition: FU with Marytza Grandpre C. Duke Salvia, MD, [facility] in 6 months.  Medication Adjustments/Labs and Tests Ordered: Current medicines are reviewed at length with the patient today.  Concerns regarding medicines are outlined above.   Orders Placed This Encounter  Procedures   Lipid panel   Comprehensive metabolic panel   No orders of the defined types were placed in this encounter.  Patient Instructions  Medication Instructions:  Your physician recommends that you continue on your current medications as directed. Please refer to the Current Medication list given to you today.  *If you need a refill on your cardiac medications before your next appointment, please call your pharmacy*  Lab Work: FASTING LP/CMET SOON   If you have labs (blood work) drawn today and your tests are completely normal, you will receive your results only by: MyChart Message (if you have MyChart) OR A paper copy in the mail If you have any lab test that is abnormal or we need to change your treatment, we will call you to review the results.  Testing/Procedures: NONE  Follow-Up: At [facility], you and your health needs are our priority.  As part of our continuing mission to provide you with exceptional heart care, we have created designated Provider Care Teams.  These Care Teams include your primary Cardiologist (physician) and Advanced Practice Providers (APPs -  Physician  Assistants and Nurse Practitioners) who all work together to provide you with the care you need, when you need it.  We recommend signing up for the patient portal called "MyChart".  Sign up information is provided on this After Visit Summary.  MyChart is used to connect with patients for Virtual Visits (Telemedicine).  Patients are able to view lab/test results, encounter notes, upcoming appointments, etc.  Non-urgent messages can be sent to your provider as well.   To learn more about what you can do with MyChart, go to [email or web address].    Your next appointment:   6 month(s)  The format for your next appointment:   In Person  Provider:   Chilton Si, MD   Other Instructions MONITOR BLOOD PRESSURES DAILY      I,Jenifer Velasquez,acting as a scribe for Chilton Si, MD.,have documented all relevant documentation on the behalf of Chilton Si, MD,as directed by  Chilton Si, MD while in the presence of Chilton Si, MD.   I, Yariah Selvey C. Duke Salvia, MD have reviewed all documentation for this visit.  The documentation of the exam, diagnosis, procedures, and orders on 09/30/2021 are all accurate and complete.  Signed, Chilton Si, MD  09/30/2021 5:37 PM    [facility]

## 2021-09-30 NOTE — Assessment & Plan Note (Signed)
He continues to lose weight despite adding mirtazepine.  Avoid diuretics if his BP does continue to be elevated and he needs medication.

## 2021-09-30 NOTE — Patient Instructions (Signed)
Medication Instructions:  Your physician recommends that you continue on your current medications as directed. Please refer to the Current Medication list given to you today.  *If you need a refill on your cardiac medications before your next appointment, please call your pharmacy*  Lab Work: FASTING LP/CMET SOON   If you have labs (blood work) drawn today and your tests are completely normal, you will receive your results only by: MyChart Message (if you have MyChart) OR A paper copy in the mail If you have any lab test that is abnormal or we need to change your treatment, we will call you to review the results.  Testing/Procedures: NONE  Follow-Up: At [facility], you and your health needs are our priority.  As part of our continuing mission to provide you with exceptional heart care, we have created designated Provider Care Teams.  These Care Teams include your primary Cardiologist (physician) and Advanced Practice Providers (APPs -  Physician Assistants and Nurse Practitioners) who all work together to provide you with the care you need, when you need it.  We recommend signing up for the patient portal called "MyChart".  Sign up information is provided on this After Visit Summary.  MyChart is used to connect with patients for Virtual Visits (Telemedicine).  Patients are able to view lab/test results, encounter notes, upcoming appointments, etc.  Non-urgent messages can be sent to your provider as well.   To learn more about what you can do with MyChart, go to [email or web address].    Your next appointment:   6 month(s)  The format for your next appointment:   In Person  Provider:   Chilton Si, MD   Other Instructions MONITOR BLOOD PRESSURES DAILY

## 2021-10-01 DIAGNOSIS — M6259 Muscle wasting and atrophy, not elsewhere classified, multiple sites: Secondary | ICD-10-CM | POA: Diagnosis not present

## 2021-10-01 DIAGNOSIS — R627 Adult failure to thrive: Secondary | ICD-10-CM | POA: Diagnosis not present

## 2021-10-01 DIAGNOSIS — I69828 Other speech and language deficits following other cerebrovascular disease: Secondary | ICD-10-CM | POA: Diagnosis not present

## 2021-10-01 DIAGNOSIS — E43 Unspecified severe protein-calorie malnutrition: Secondary | ICD-10-CM | POA: Diagnosis not present

## 2021-10-05 DIAGNOSIS — L89153 Pressure ulcer of sacral region, stage 3: Secondary | ICD-10-CM | POA: Diagnosis not present

## 2021-10-14 DIAGNOSIS — R41841 Cognitive communication deficit: Secondary | ICD-10-CM | POA: Diagnosis not present

## 2021-10-14 DIAGNOSIS — R001 Bradycardia, unspecified: Secondary | ICD-10-CM | POA: Diagnosis not present

## 2021-10-14 DIAGNOSIS — I69351 Hemiplegia and hemiparesis following cerebral infarction affecting right dominant side: Secondary | ICD-10-CM | POA: Diagnosis not present

## 2021-10-14 DIAGNOSIS — E43 Unspecified severe protein-calorie malnutrition: Secondary | ICD-10-CM | POA: Diagnosis not present

## 2021-10-14 DIAGNOSIS — S42201D Unspecified fracture of upper end of right humerus, subsequent encounter for fracture with routine healing: Secondary | ICD-10-CM | POA: Diagnosis not present

## 2021-10-14 DIAGNOSIS — R262 Difficulty in walking, not elsewhere classified: Secondary | ICD-10-CM | POA: Diagnosis not present

## 2021-10-14 DIAGNOSIS — M6281 Muscle weakness (generalized): Secondary | ICD-10-CM | POA: Diagnosis not present

## 2021-10-14 DIAGNOSIS — M6259 Muscle wasting and atrophy, not elsewhere classified, multiple sites: Secondary | ICD-10-CM | POA: Diagnosis not present

## 2021-10-14 DIAGNOSIS — I700 Atherosclerosis of aorta: Secondary | ICD-10-CM | POA: Diagnosis not present

## 2021-10-14 DIAGNOSIS — R1312 Dysphagia, oropharyngeal phase: Secondary | ICD-10-CM | POA: Diagnosis not present

## 2021-10-14 DIAGNOSIS — R627 Adult failure to thrive: Secondary | ICD-10-CM | POA: Diagnosis not present

## 2021-10-19 ENCOUNTER — Other Ambulatory Visit: Payer: Self-pay | Admitting: *Deleted

## 2021-10-19 NOTE — Patient Outreach (Signed)
Per [facility] eligible member currently resides in [facility].  Screened for potential care coordination/care management services as a benefit of member's insurance plan.  Member's PCP at [facility] has [facility] care management services available.   Facility site visit to [facility] skilled nursing facility. Met with David Scott, SNF SW concerning  transition plan and potential care coordination needs. David Scott reports member will need 24/7 post SNF. Transition plan is to eventually move in with daughter. However, family plans to pay privately until transition to daughter's house.  No identifiable care coordination needs at this time.    David Noble, MSN, RN,BSN [facility] Post Acute Care Coordinator [phone] [facility]) [phone]  (Toll free office)

## 2021-10-21 DIAGNOSIS — R262 Difficulty in walking, not elsewhere classified: Secondary | ICD-10-CM | POA: Diagnosis not present

## 2021-10-21 DIAGNOSIS — M6281 Muscle weakness (generalized): Secondary | ICD-10-CM | POA: Diagnosis not present

## 2021-10-21 DIAGNOSIS — R001 Bradycardia, unspecified: Secondary | ICD-10-CM | POA: Diagnosis not present

## 2021-10-21 DIAGNOSIS — I69351 Hemiplegia and hemiparesis following cerebral infarction affecting right dominant side: Secondary | ICD-10-CM | POA: Diagnosis not present

## 2021-10-21 DIAGNOSIS — I700 Atherosclerosis of aorta: Secondary | ICD-10-CM | POA: Diagnosis not present

## 2021-10-21 DIAGNOSIS — R627 Adult failure to thrive: Secondary | ICD-10-CM | POA: Diagnosis not present

## 2021-10-21 DIAGNOSIS — S42201D Unspecified fracture of upper end of right humerus, subsequent encounter for fracture with routine healing: Secondary | ICD-10-CM | POA: Diagnosis not present

## 2021-10-21 DIAGNOSIS — M6259 Muscle wasting and atrophy, not elsewhere classified, multiple sites: Secondary | ICD-10-CM | POA: Diagnosis not present

## 2021-10-21 DIAGNOSIS — R41841 Cognitive communication deficit: Secondary | ICD-10-CM | POA: Diagnosis not present

## 2021-10-21 DIAGNOSIS — R1312 Dysphagia, oropharyngeal phase: Secondary | ICD-10-CM | POA: Diagnosis not present

## 2021-10-21 DIAGNOSIS — E43 Unspecified severe protein-calorie malnutrition: Secondary | ICD-10-CM | POA: Diagnosis not present

## 2021-10-21 DIAGNOSIS — L89324 Pressure ulcer of left buttock, stage 4: Secondary | ICD-10-CM | POA: Diagnosis not present

## 2021-10-26 DIAGNOSIS — L89153 Pressure ulcer of sacral region, stage 3: Secondary | ICD-10-CM | POA: Diagnosis not present

## 2021-10-28 DIAGNOSIS — S42201D Unspecified fracture of upper end of right humerus, subsequent encounter for fracture with routine healing: Secondary | ICD-10-CM | POA: Diagnosis not present

## 2021-10-28 DIAGNOSIS — S42201S Unspecified fracture of upper end of right humerus, sequela: Secondary | ICD-10-CM | POA: Diagnosis not present

## 2021-11-02 DIAGNOSIS — I69828 Other speech and language deficits following other cerebrovascular disease: Secondary | ICD-10-CM | POA: Diagnosis not present

## 2021-11-02 DIAGNOSIS — E43 Unspecified severe protein-calorie malnutrition: Secondary | ICD-10-CM | POA: Diagnosis not present

## 2021-11-02 DIAGNOSIS — R627 Adult failure to thrive: Secondary | ICD-10-CM | POA: Diagnosis not present

## 2021-11-02 DIAGNOSIS — M6259 Muscle wasting and atrophy, not elsewhere classified, multiple sites: Secondary | ICD-10-CM | POA: Diagnosis not present

## 2021-11-09 ENCOUNTER — Emergency Department (HOSPITAL_COMMUNITY): Payer: Medicare Other

## 2021-11-09 ENCOUNTER — Other Ambulatory Visit: Payer: Self-pay

## 2021-11-09 ENCOUNTER — Inpatient Hospital Stay (HOSPITAL_COMMUNITY)
Admission: EM | Admit: 2021-11-09 | Discharge: 2021-11-24 | DRG: 592 | Disposition: E | Payer: Medicare Other | Source: Skilled Nursing Facility | Attending: Internal Medicine | Admitting: Internal Medicine

## 2021-11-09 ENCOUNTER — Encounter (HOSPITAL_COMMUNITY): Payer: Self-pay

## 2021-11-09 DIAGNOSIS — K219 Gastro-esophageal reflux disease without esophagitis: Secondary | ICD-10-CM | POA: Diagnosis present

## 2021-11-09 DIAGNOSIS — K439 Ventral hernia without obstruction or gangrene: Secondary | ICD-10-CM | POA: Diagnosis not present

## 2021-11-09 DIAGNOSIS — Z681 Body mass index (BMI) 19 or less, adult: Secondary | ICD-10-CM

## 2021-11-09 DIAGNOSIS — Z7982 Long term (current) use of aspirin: Secondary | ICD-10-CM

## 2021-11-09 DIAGNOSIS — J449 Chronic obstructive pulmonary disease, unspecified: Secondary | ICD-10-CM | POA: Diagnosis not present

## 2021-11-09 DIAGNOSIS — J9691 Respiratory failure, unspecified with hypoxia: Secondary | ICD-10-CM | POA: Diagnosis not present

## 2021-11-09 DIAGNOSIS — G9349 Other encephalopathy: Secondary | ICD-10-CM | POA: Diagnosis present

## 2021-11-09 DIAGNOSIS — I10 Essential (primary) hypertension: Secondary | ICD-10-CM | POA: Diagnosis present

## 2021-11-09 DIAGNOSIS — R001 Bradycardia, unspecified: Secondary | ICD-10-CM | POA: Diagnosis present

## 2021-11-09 DIAGNOSIS — Z9181 History of falling: Secondary | ICD-10-CM

## 2021-11-09 DIAGNOSIS — K4090 Unilateral inguinal hernia, without obstruction or gangrene, not specified as recurrent: Secondary | ICD-10-CM | POA: Diagnosis present

## 2021-11-09 DIAGNOSIS — Z5320 Procedure and treatment not carried out because of patient's decision for unspecified reasons: Secondary | ICD-10-CM | POA: Diagnosis not present

## 2021-11-09 DIAGNOSIS — R54 Age-related physical debility: Secondary | ICD-10-CM | POA: Diagnosis present

## 2021-11-09 DIAGNOSIS — L89159 Pressure ulcer of sacral region, unspecified stage: Secondary | ICD-10-CM | POA: Diagnosis not present

## 2021-11-09 DIAGNOSIS — R7401 Elevation of levels of liver transaminase levels: Secondary | ICD-10-CM | POA: Diagnosis not present

## 2021-11-09 DIAGNOSIS — G231 Progressive supranuclear ophthalmoplegia [Steele-Richardson-Olszewski]: Secondary | ICD-10-CM | POA: Diagnosis not present

## 2021-11-09 DIAGNOSIS — Z515 Encounter for palliative care: Secondary | ICD-10-CM | POA: Diagnosis not present

## 2021-11-09 DIAGNOSIS — L89154 Pressure ulcer of sacral region, stage 4: Principal | ICD-10-CM | POA: Diagnosis present

## 2021-11-09 DIAGNOSIS — L089 Local infection of the skin and subcutaneous tissue, unspecified: Principal | ICD-10-CM

## 2021-11-09 DIAGNOSIS — R413 Other amnesia: Secondary | ICD-10-CM | POA: Diagnosis not present

## 2021-11-09 DIAGNOSIS — M6250 Muscle wasting and atrophy, not elsewhere classified, unspecified site: Secondary | ICD-10-CM | POA: Diagnosis present

## 2021-11-09 DIAGNOSIS — Z8673 Personal history of transient ischemic attack (TIA), and cerebral infarction without residual deficits: Secondary | ICD-10-CM | POA: Diagnosis not present

## 2021-11-09 DIAGNOSIS — Z825 Family history of asthma and other chronic lower respiratory diseases: Secondary | ICD-10-CM

## 2021-11-09 DIAGNOSIS — D72829 Elevated white blood cell count, unspecified: Secondary | ICD-10-CM | POA: Diagnosis present

## 2021-11-09 DIAGNOSIS — E43 Unspecified severe protein-calorie malnutrition: Secondary | ICD-10-CM | POA: Diagnosis present

## 2021-11-09 DIAGNOSIS — L02818 Cutaneous abscess of other sites: Secondary | ICD-10-CM | POA: Diagnosis present

## 2021-11-09 DIAGNOSIS — Z66 Do not resuscitate: Secondary | ICD-10-CM | POA: Diagnosis present

## 2021-11-09 DIAGNOSIS — I959 Hypotension, unspecified: Secondary | ICD-10-CM | POA: Diagnosis not present

## 2021-11-09 DIAGNOSIS — R627 Adult failure to thrive: Secondary | ICD-10-CM | POA: Diagnosis present

## 2021-11-09 DIAGNOSIS — Z888 Allergy status to other drugs, medicaments and biological substances status: Secondary | ICD-10-CM

## 2021-11-09 DIAGNOSIS — Z7401 Bed confinement status: Secondary | ICD-10-CM | POA: Diagnosis not present

## 2021-11-09 DIAGNOSIS — L89893 Pressure ulcer of other site, stage 3: Secondary | ICD-10-CM | POA: Diagnosis not present

## 2021-11-09 DIAGNOSIS — E860 Dehydration: Secondary | ICD-10-CM | POA: Diagnosis present

## 2021-11-09 DIAGNOSIS — Z882 Allergy status to sulfonamides status: Secondary | ICD-10-CM

## 2021-11-09 DIAGNOSIS — R945 Abnormal results of liver function studies: Secondary | ICD-10-CM | POA: Diagnosis not present

## 2021-11-09 DIAGNOSIS — N400 Enlarged prostate without lower urinary tract symptoms: Secondary | ICD-10-CM | POA: Diagnosis not present

## 2021-11-09 DIAGNOSIS — Z7189 Other specified counseling: Secondary | ICD-10-CM | POA: Diagnosis not present

## 2021-11-09 DIAGNOSIS — L89150 Pressure ulcer of sacral region, unstageable: Secondary | ICD-10-CM

## 2021-11-09 DIAGNOSIS — L02212 Cutaneous abscess of back [any part, except buttock]: Secondary | ICD-10-CM | POA: Diagnosis present

## 2021-11-09 DIAGNOSIS — L8990 Pressure ulcer of unspecified site, unspecified stage: Secondary | ICD-10-CM | POA: Diagnosis present

## 2021-11-09 DIAGNOSIS — Z993 Dependence on wheelchair: Secondary | ICD-10-CM

## 2021-11-09 DIAGNOSIS — L89153 Pressure ulcer of sacral region, stage 3: Secondary | ICD-10-CM | POA: Diagnosis not present

## 2021-11-09 DIAGNOSIS — Z79899 Other long term (current) drug therapy: Secondary | ICD-10-CM

## 2021-11-09 DIAGNOSIS — R000 Tachycardia, unspecified: Secondary | ICD-10-CM | POA: Diagnosis not present

## 2021-11-09 LAB — CBC WITH DIFFERENTIAL/PLATELET
Abs Immature Granulocytes: 0.06 10*3/uL (ref 0.00–0.07)
Basophils Absolute: 0 10*3/uL (ref 0.0–0.1)
Basophils Relative: 0 %
Eosinophils Absolute: 0.1 10*3/uL (ref 0.0–0.5)
Eosinophils Relative: 1 %
HCT: 34.6 % — ABNORMAL LOW (ref 39.0–52.0)
Hemoglobin: 11.1 g/dL — ABNORMAL LOW (ref 13.0–17.0)
Immature Granulocytes: 1 %
Lymphocytes Relative: 6 %
Lymphs Abs: 0.7 10*3/uL (ref 0.7–4.0)
MCH: 31.4 pg (ref 26.0–34.0)
MCHC: 32.1 g/dL (ref 30.0–36.0)
MCV: 98 fL (ref 80.0–100.0)
Monocytes Absolute: 0.7 10*3/uL (ref 0.1–1.0)
Monocytes Relative: 5 %
Neutro Abs: 11.6 10*3/uL — ABNORMAL HIGH (ref 1.7–7.7)
Neutrophils Relative %: 87 %
Platelets: 479 10*3/uL — ABNORMAL HIGH (ref 150–400)
RBC: 3.53 MIL/uL — ABNORMAL LOW (ref 4.22–5.81)
RDW: 14 % (ref 11.5–15.5)
WBC: 13.2 10*3/uL — ABNORMAL HIGH (ref 4.0–10.5)
nRBC: 0 % (ref 0.0–0.2)

## 2021-11-09 LAB — COMPREHENSIVE METABOLIC PANEL
ALT: 69 U/L — ABNORMAL HIGH (ref 0–44)
AST: 54 U/L — ABNORMAL HIGH (ref 15–41)
Albumin: 2.2 g/dL — ABNORMAL LOW (ref 3.5–5.0)
Alkaline Phosphatase: 93 U/L (ref 38–126)
Anion gap: 7 (ref 5–15)
BUN: 24 mg/dL — ABNORMAL HIGH (ref 8–23)
CO2: 28 mmol/L (ref 22–32)
Calcium: 8 mg/dL — ABNORMAL LOW (ref 8.9–10.3)
Chloride: 101 mmol/L (ref 98–111)
Creatinine, Ser: 0.76 mg/dL (ref 0.61–1.24)
GFR, Estimated: >60 mL/min (ref >60)
Glucose, Bld: 151 mg/dL — ABNORMAL HIGH (ref 70–99)
Potassium: 4.2 mmol/L (ref 3.5–5.1)
Sodium: 136 mmol/L (ref 135–145)
Total Bilirubin: 0.4 mg/dL (ref 0.3–1.2)
Total Protein: 6.1 g/dL — ABNORMAL LOW (ref 6.5–8.1)

## 2021-11-09 MED ORDER — VANCOMYCIN HCL IN DEXTROSE 1-5 GM/200ML-% IV SOLN
1000.0000 mg | INTRAVENOUS | Status: DC
  Administered 2021-11-11 – 2021-11-13 (×4): 1000 mg via INTRAVENOUS
  Filled 2021-11-09 (×4): qty 200

## 2021-11-09 MED ORDER — MIRTAZAPINE 15 MG PO TBDP
15.0000 mg | ORAL_TABLET | Freq: Every day | ORAL | Status: DC
  Administered 2021-11-10 – 2021-11-14 (×5): 15 mg via ORAL
  Filled 2021-11-09 (×6): qty 1

## 2021-11-09 MED ORDER — HYPROMELLOSE (GONIOSCOPIC) 2.5 % OP SOLN
1.0000 [drp] | Freq: Two times a day (BID) | OPHTHALMIC | Status: DC
Start: 2021-11-09 — End: 2021-11-09

## 2021-11-09 MED ORDER — ADULT MULTIVITAMIN W/MINERALS CH
1.0000 | ORAL_TABLET | Freq: Every day | ORAL | Status: DC
  Administered 2021-11-12 – 2021-11-15 (×4): 1 via ORAL
  Filled 2021-11-09 (×6): qty 1

## 2021-11-09 MED ORDER — MELATONIN 3 MG PO TABS: 3.0000 mg | ORAL_TABLET | Freq: Every evening | ORAL | Status: DC | PRN

## 2021-11-09 MED ORDER — DEXTROSE IN LACTATED RINGERS 5 % IV SOLN: INTRAVENOUS | Status: AC

## 2021-11-09 MED ORDER — PANTOPRAZOLE SODIUM 40 MG PO TBEC
40.0000 mg | DELAYED_RELEASE_TABLET | Freq: Every day | ORAL | Status: DC
  Administered 2021-11-12 – 2021-11-15 (×4): 40 mg via ORAL
  Filled 2021-11-09 (×6): qty 1

## 2021-11-09 MED ORDER — CEFAZOLIN SODIUM-DEXTROSE 1-4 GM/50ML-% IV SOLN
1.0000 g | Freq: Once | INTRAVENOUS | Status: AC
  Administered 2021-11-09: 1 g via INTRAVENOUS
  Filled 2021-11-09: qty 50

## 2021-11-09 MED ORDER — ALBUTEROL SULFATE HFA 108 (90 BASE) MCG/ACT IN AERS
2.0000 | INHALATION_SPRAY | Freq: Four times a day (QID) | RESPIRATORY_TRACT | Status: DC | PRN
Start: 2021-11-09 — End: 2021-11-09

## 2021-11-09 MED ORDER — ALBUTEROL SULFATE (2.5 MG/3ML) 0.083% IN NEBU
2.5000 mg | INHALATION_SOLUTION | Freq: Four times a day (QID) | RESPIRATORY_TRACT | Status: DC | PRN
Start: 2021-11-09 — End: 2021-11-16

## 2021-11-09 MED ORDER — VANCOMYCIN HCL IN DEXTROSE 1-5 GM/200ML-% IV SOLN
1000.0000 mg | Freq: Once | INTRAVENOUS | Status: AC
  Administered 2021-11-10: 1000 mg via INTRAVENOUS
  Filled 2021-11-09: qty 200

## 2021-11-09 MED ORDER — SODIUM CHLORIDE 0.9 % IV SOLN
2.0000 g | Freq: Once | INTRAVENOUS | Status: AC
  Administered 2021-11-10: 2 g via INTRAVENOUS
  Filled 2021-11-09: qty 12.5

## 2021-11-09 MED ORDER — MAGNESIUM OXIDE -MG SUPPLEMENT 400 (240 MG) MG PO TABS
200.0000 mg | ORAL_TABLET | Freq: Every day | ORAL | Status: DC
  Administered 2021-11-12 – 2021-11-15 (×4): 200 mg via ORAL
  Filled 2021-11-09 (×6): qty 1

## 2021-11-09 MED ORDER — POLYVINYL ALCOHOL 1.4 % OP SOLN
1.0000 [drp] | Freq: Two times a day (BID) | OPHTHALMIC | Status: DC
  Administered 2021-11-11 – 2021-11-15 (×9): 1 [drp] via OPHTHALMIC
  Filled 2021-11-09 (×2): qty 15

## 2021-11-09 MED ORDER — HEPARIN SODIUM (PORCINE) 5000 UNIT/ML IJ SOLN
5000.0000 [IU] | Freq: Three times a day (TID) | INTRAMUSCULAR | Status: DC
  Administered 2021-11-10 – 2021-11-12 (×6): 5000 [IU] via SUBCUTANEOUS
  Filled 2021-11-09 (×6): qty 1

## 2021-11-09 MED ORDER — ENSURE ENLIVE PO LIQD
237.0000 mL | Freq: Three times a day (TID) | ORAL | Status: DC
  Administered 2021-11-12 – 2021-11-15 (×6): 237 mL via ORAL
  Filled 2021-11-09: qty 237

## 2021-11-09 MED ORDER — THIAMINE HCL 100 MG PO TABS
100.0000 mg | ORAL_TABLET | Freq: Every day | ORAL | Status: DC
  Administered 2021-11-12 – 2021-11-15 (×4): 100 mg via ORAL
  Filled 2021-11-09 (×6): qty 1

## 2021-11-09 MED ORDER — MAGNESIUM 250 MG PO TABS: 250.0000 mg | ORAL_TABLET | Freq: Every day | ORAL | Status: DC

## 2021-11-09 MED ORDER — SODIUM CHLORIDE 0.9 % IV SOLN
2.0000 g | Freq: Two times a day (BID) | INTRAVENOUS | Status: DC
  Administered 2021-11-10 – 2021-11-14 (×9): 2 g via INTRAVENOUS
  Filled 2021-11-09 (×9): qty 12.5

## 2021-11-09 MED ORDER — ASPIRIN 81 MG PO CHEW
81.0000 mg | CHEWABLE_TABLET | Freq: Every day | ORAL | Status: DC
Start: 2021-11-10 — End: 2021-11-16
  Administered 2021-11-12 – 2021-11-15 (×3): 81 mg via ORAL
  Filled 2021-11-09 (×3): qty 1

## 2021-11-09 MED ORDER — ASCORBIC ACID 500 MG PO TABS
1000.0000 mg | ORAL_TABLET | Freq: Every day | ORAL | Status: DC
  Administered 2021-11-12 – 2021-11-15 (×4): 1000 mg via ORAL
  Filled 2021-11-09 (×6): qty 2

## 2021-11-09 NOTE — Progress Notes (Signed)
Pharmacy Antibiotic Note  David Scott is a 81 y.o. male admitted on 11/14/2021 with cellulitis.  PMH significant for sacral wound x 1 week.  Patient received Cefazolin 1gm IV x 1 in the ED.  Pharmacy has been consulted for Vancomycin and Cefepime dosing.  Plan: Cefepime 2gm IV q12h Vancomycin 1000 mg IV Q 24 hrs. Goal AUC 400-550.  Expected AUC: 506.4  SCr used: 0.8 (rounded up from 0.76) Follow renal function F/u culture results and sensitivities  Height: 5\' 10"  (177.8 cm) Weight: 53 kg (116 lb 13.5 oz) IBW/kg (Calculated) : 73  Temp (24hrs), Avg:98.2 F (36.8 C), Min:97.5 F (36.4 C), Max:98.9 F (37.2 C)  Recent Labs  Lab 10/24/2021 1537  WBC 13.2*  CREATININE 0.76    Estimated Creatinine Clearance: 57 mL/min (by C-G formula based on SCr of 0.76 mg/dL).    Allergies  Allergen Reactions   Benzonatate     Other reaction(s): vivid dreams   Tamsulosin Hcl     Other reaction(s): dizzy/achy   Septra [Sulfamethoxazole-Trimethoprim] Rash    Pt states MD had him taking Sulfa by the handful when he had prostate infection, says they don't know what caused the rash    Antimicrobials this admission: 7/17 Cefazolin x 1 7/18 Cefepime >>   7/18 Vancomycin >>  Dose adjustments this admission:    Microbiology results: 7/17 BCx:      Thank you for allowing pharmacy to be a part of this patient's care.  8/17, PharmD 11/22/2021 11:41 PM

## 2021-11-09 NOTE — ED Notes (Signed)
Patient refusing second set of blood cultures stating he does not want to be stuck again. Patient explained the importance of the blood cultures, however patient still refused. PA, [place].

## 2021-11-09 NOTE — ED Provider Notes (Signed)
[name]-EMERGENCY DEPT Provider Note   CSN: [id number] Arrival date & time: 11-27-21  1450     History {Add pertinent medical, surgical, social history, OB history to HPI:1} No chief complaint on file.   David Scott is a 81 y.o. male.  Pt sent to ED for concerns of infected decubital ulcer.  Pt sent from nursing facility.  They report pt has been going to wound care and that area appeared to be healing but now has increased redness. Pt unable to provide any history   The history is provided by the patient. No language interpreter was used.       Home Medications Prior to Admission medications   Medication Sig Start Date End Date Taking? Authorizing Provider  acetaminophen (TYLENOL) 325 MG tablet Take 2 tablets (650 mg total) by mouth every 6 (six) hours as needed for mild pain (or Fever >/= 101). 08/28/21   Elgergawy, Leana Roe, MD  albuterol (PROVENTIL HFA;VENTOLIN HFA) 108 (90 Base) MCG/ACT inhaler Inhale 2 puffs into the lungs every 6 (six) hours as needed for wheezing or shortness of breath.  09/09/17   [provider]  Amino Acids-Protein Hydrolys (FEEDING SUPPLEMENT, PRO-STAT SUGAR FREE 64,) LIQD Take 30 mLs by mouth 3 (three) times daily with meals. For wound healing    [provider]  aspirin 81 MG tablet Take 81 mg by mouth daily after lunch.     [provider]  bisacodyl (DULCOLAX) 10 MG suppository If not relieved by MOM, give 10 mg Bisacodyl suppositiory rectally X 1 dose in 24 hours as needed    [provider]  Cholecalciferol (VITAMIN D-3 PO) Take 1 capsule by mouth daily after lunch.     [provider]  feeding supplement (ENSURE ENLIVE / ENSURE PLUS) LIQD Take 237 mLs by mouth 3 (three) times daily between meals. 08/28/21   Elgergawy, Leana Roe, MD  Lactobacillus (PROBIOTIC ACIDOPHILUS PO) Take 1 capsule by mouth daily.    [provider]  lactose free nutrition (BOOST PLUS) LIQD Take 237  mLs by mouth 3 (three) times daily with meals. 08/28/21   Elgergawy, Leana Roe, MD  Magnesium 250 MG TABS Take 250 mg by mouth daily.    [provider]  Magnesium Hydroxide (MILK OF MAGNESIA PO) If no BM in 3 days, give 30 cc Milk of Magnesium p.o. x 1 dose in 24 hours as needed    [provider]  melatonin 3 MG TABS tablet Take 3 mg by mouth at bedtime.    [provider]  mirtazapine (REMERON SOL-TAB) 15 MG disintegrating tablet Take 1 tablet (15 mg total) by mouth at bedtime. 08/28/21   Elgergawy, Leana Roe, MD  Multiple Vitamin (MULTIVITAMIN) tablet Take 1 tablet by mouth daily.    [provider]  NON FORMULARY DIET: REGULAR DIET NAS HEART HEALTHY/ THIN LIQUIDS    [provider]  pantoprazole (PROTONIX) 40 MG tablet Take 1 tablet (40 mg total) by mouth daily. 08/29/21   Elgergawy, Leana Roe, MD  POLYETHYLENE GLYCOL 3350 PO Take by mouth. MIX 1 CAPFUL (17GM) WITH 4-8 OZ OF BEVERAGE OF CHOICE AND DRINK BY MOUTH ONCE DAILY FOR CONSTIPATION    [provider]  Propylene Glycol (SYSTANE BALANCE OP) Place 1 drop into both eyes daily.    [provider]  Sodium Phosphates (RA SALINE ENEMA RE) If not relieved by Biscodyl suppository, give disposable Saline Enema rectally X 1 dose/24 hrs as needed  [provider]  thiamine 100 MG tablet Take 1 tablet (100 mg total) by mouth daily. 08/28/21   Elgergawy, Leana Roe, MD  UNABLE TO FIND Med Name: Med Pass four times daily    [provider]  vitamin C (ASCORBIC ACID) 500 MG tablet Take 1,000 mg by mouth daily.    [provider]      Allergies    Benzonatate, Tamsulosin hcl, and Septra [sulfamethoxazole-trimethoprim]    Review of Systems   Review of Systems  All other systems reviewed and are negative.   Physical Exam Updated Vital Signs BP (!) 143/83   Pulse (!) 110   Temp (!) 97.5 F (36.4 C) (Oral)   Resp 18   Ht 5\' 10"  (1.778 m)   Wt 53 kg   SpO2 99%    BMI 16.77 kg/m  Physical Exam Vitals and nursing note reviewed.  Constitutional:      General: He is not in acute distress.    Appearance: He is well-developed.  HENT:     Head: Normocephalic.  Eyes:     Conjunctiva/sclera: Conjunctivae normal.  Cardiovascular:     Rate and Rhythm: Normal rate.     Heart sounds: No murmur heard. Pulmonary:     Effort: Pulmonary effort is normal. No respiratory distress.     Breath sounds: Normal breath sounds.  Abdominal:     Palpations: Abdomen is soft.     Tenderness: There is no abdominal tenderness.  Musculoskeletal:        General: No swelling.  Skin:    Capillary Refill: Capillary refill takes less than 2 seconds.     Comments: 10x4cm stage 4 ulcer sacral area,  deep, large area of redness surrounding  20 cm area   Psychiatric:        Mood and Affect: Mood normal.    ED Results / Procedures / Treatments   Labs (all labs ordered are listed, but only abnormal results are displayed) Labs Reviewed  CBC WITH DIFFERENTIAL/PLATELET - Abnormal; Notable for the following components:      Result Value   WBC 13.2 (*)    RBC 3.53 (*)    Hemoglobin 11.1 (*)    HCT 34.6 (*)    Platelets 479 (*)    Neutro Abs 11.6 (*)    All other components within normal limits  COMPREHENSIVE METABOLIC PANEL - Abnormal; Notable for the following components:   Glucose, Bld 151 (*)    BUN 24 (*)    Calcium 8.0 (*)    Total Protein 6.1 (*)    Albumin 2.2 (*)    AST 54 (*)    ALT 69 (*)    All other components within normal limits    EKG None  Radiology No results found.  Procedures Procedures  {Document cardiac monitor, telemetry assessment procedure when appropriate:1}  Medications Ordered in ED Medications - No data to display  ED Course/ Medical Decision Making/ A&P                           Medical Decision Making Amount and/or Complexity of Data Reviewed Labs: ordered.     {Document critical care time when  appropriate:1} {Document review of labs and clinical decision tools ie heart score, Chads2Vasc2 etc:1}  {Document your independent review of radiology images, and any outside records:1} {Document your discussion with family members, caretakers, and with consultants:1} {Document social determinants of health affecting pt's care:1} {Document your  decision making why or why not admission, treatments were needed:1} Final Clinical Impression(s) / ED Diagnoses Final diagnoses:  None    Rx / DC Orders ED Discharge Orders     None

## 2021-11-09 NOTE — H&P (Signed)
History and Physical    [name]:[id number] DOB: 08/07/1940 DOA: 11/18/2021  PCP: Elias Else, MD  Patient coming from: Skilled nursing facility.  Chief Complaint: Worsening sacral decubitus ulcer.  HPI: David Scott is a 81 y.o. male with history of stroke, severe protein calorie malnutrition, failure to thrive who has been bedbound since his last fall in May was noticed to have worsening sacral decubitus ulcer as seen in a skilled nursing facility.  Was transferred to the ER.  Per daughter there was no reports of any fever chills.  ED Course: In the ER CT scan shows features concerning for abscess with gas.  Labs showed leukocytosis.  Patient was started on empiric antibiotics admitted for further work-up.  On exam patient is minimally communicative which patient's daughter states is his baseline.  Patient will need complete assistance with his feeding.  Review of Systems: As per HPI, rest all negative.   Past Medical History:  Diagnosis Date   Aortic atherosclerosis (HCC) 06/11/2021   Bradycardia 06/11/2021   CVA (cerebral vascular accident) [facility])    Esophageal reflux    Leg fracture    Lethargy 09/30/2021   Shortness of breath 03/03/2021   Skin lesion 03/03/2021    History reviewed. No pertinent surgical history.   reports that he has never smoked. He has never used smokeless tobacco. He reports that he does not drink alcohol and does not use drugs.  Allergies  Allergen Reactions   Benzonatate     Other reaction(s): vivid dreams   Tamsulosin Hcl     Other reaction(s): dizzy/achy   Septra [Sulfamethoxazole-Trimethoprim] Rash    Pt states MD had him taking Sulfa by the handful when he had prostate infection, says they don't know what caused the rash    Family History  Problem Relation Age of Onset   COPD Father     Prior to Admission medications   Medication Sig Start Date End Date Taking? Authorizing Provider  acetaminophen (TYLENOL) 325 MG tablet Take  2 tablets (650 mg total) by mouth every 6 (six) hours as needed for mild pain (or Fever >/= 101). 08/28/21  Yes Elgergawy, Leana Roe, MD  albuterol (PROVENTIL HFA;VENTOLIN HFA) 108 (90 Base) MCG/ACT inhaler Inhale 2 puffs into the lungs every 6 (six) hours as needed for wheezing or shortness of breath.  09/09/17  Yes [provider]  aspirin 81 MG tablet Take 81 mg by mouth daily after lunch.    Yes [provider]  Cholecalciferol (VITAMIN D-3 PO) Take 1 capsule by mouth daily after lunch.    Yes [provider]  hydroxypropyl methylcellulose / hypromellose (ISOPTO TEARS / GONIOVISC) 2.5 % ophthalmic solution Place 1 drop into both eyes 2 (two) times daily.   Yes [provider]  Lactobacillus (PROBIOTIC ACIDOPHILUS PO) Take 1 capsule by mouth daily.   Yes [provider]  Magnesium 250 MG TABS Take 250 mg by mouth daily.   Yes [provider]  melatonin 3 MG TABS tablet Take 3 mg by mouth at bedtime.   Yes [provider]  mirtazapine (REMERON SOL-TAB) 15 MG disintegrating tablet Take 1 tablet (15 mg total) by mouth at bedtime. 08/28/21  Yes Elgergawy, Leana Roe, MD  Multiple Vitamin (MULTIVITAMIN) tablet Take 1 tablet by mouth daily.   Yes [provider]  pantoprazole (PROTONIX) 40 MG tablet Take 1 tablet (40 mg total) by mouth daily. 08/29/21  Yes Elgergawy, Leana Roe, MD  POLYETHYLENE GLYCOL 3350 PO Take  by mouth. MIX 1 CAPFUL (17GM) WITH 4-8 OZ OF BEVERAGE OF CHOICE AND DRINK BY MOUTH ONCE DAILY FOR CONSTIPATION   Yes [provider]  Propylene Glycol (SYSTANE BALANCE OP) Place 1 drop into both eyes daily.   Yes [provider]  thiamine 100 MG tablet Take 1 tablet (100 mg total) by mouth daily. 08/28/21  Yes Elgergawy, Leana Roe, MD  vitamin C (ASCORBIC ACID) 500 MG tablet Take 1,000 mg by mouth daily.   Yes [provider]  Amino Acids-Protein Hydrolys (FEEDING SUPPLEMENT, PRO-STAT SUGAR FREE 64,) LIQD Take  30 mLs by mouth 3 (three) times daily with meals. For wound healing    [provider]  feeding supplement (ENSURE ENLIVE / ENSURE PLUS) LIQD Take 237 mLs by mouth 3 (three) times daily between meals. 08/28/21   Elgergawy, Leana Roe, MD  lactose free nutrition (BOOST PLUS) LIQD Take 237 mLs by mouth 3 (three) times daily with meals. 08/28/21   Elgergawy, Leana Roe, MD  NON FORMULARY DIET: REGULAR DIET NAS HEART HEALTHY/ THIN LIQUIDS    [provider]    Physical Exam: Constitutional: Moderately built poorly nourished. Vitals:   11-17-21 1945 11/17/2021 2000 11/17/2021 2015 2021/11/17 2030  BP: 126/64 114/78 126/82 129/76  Pulse: 70 74 71 69  Resp: (!) 26 14 (!) 22 19  Temp:  98.9 F (37.2 C)    TempSrc:  Axillary    SpO2: 99% 98% 100% 99%  Weight:      Height:       Eyes: Anicteric no pallor. ENMT: No discharge from the ears eyes nose and mouth. Neck: No mass felt.  No neck rigidity. Respiratory: No rhonchi or crepitations. Cardiovascular: S1-S2 heard. Abdomen: Inguinal hernia seen appears nonobstructed.  Abdomen is soft nontender. Musculoskeletal: No edema. Skin: No rash. Neurologic: Patient is not very communicative but responds to name and follows commands.  Has mild left-sided weakness from the previous stroke. Psychiatric: Not very communicative.   Labs on Admission: I have personally reviewed following labs and imaging studies  CBC: Recent Labs  Lab 2021-11-17 1537  WBC 13.2*  NEUTROABS 11.6*  HGB 11.1*  HCT 34.6*  MCV 98.0  PLT 479*   Basic Metabolic Panel: Recent Labs  Lab Nov 17, 2021 1537  NA 136  K 4.2  CL 101  CO2 28  GLUCOSE 151*  BUN 24*  CREATININE 0.76  CALCIUM 8.0*   GFR: Estimated Creatinine Clearance: 57 mL/min (by C-G formula based on SCr of 0.76 mg/dL). Liver Function Tests: Recent Labs  Lab 11/17/21 1537  AST 54*  ALT 69*  ALKPHOS 93  BILITOT 0.4  PROT 6.1*  ALBUMIN 2.2*   No results for input(s): "LIPASE", "AMYLASE" in the  last 168 hours. No results for input(s): "AMMONIA" in the last 168 hours. Coagulation Profile: No results for input(s): "INR", "PROTIME" in the last 168 hours. Cardiac Enzymes: No results for input(s): "CKTOTAL", "CKMB", "CKMBINDEX", "TROPONINI" in the last 168 hours. BNP (last 3 results) No results for input(s): "PROBNP" in the last 8760 hours. HbA1C: No results for input(s): "HGBA1C" in the last 72 hours. CBG: No results for input(s): "GLUCAP" in the last 168 hours. Lipid Profile: No results for input(s): "CHOL", "HDL", "LDLCALC", "TRIG", "CHOLHDL", "LDLDIRECT" in the last 72 hours. Thyroid Function Tests: No results for input(s): "TSH", "T4TOTAL", "FREET4", "T3FREE", "THYROIDAB" in the last 72 hours. Anemia Panel: No results for input(s): "VITAMINB12", "FOLATE", "FERRITIN", "TIBC", "IRON", "RETICCTPCT" in the last 72 hours. Urine analysis:  Component Value Date/Time   COLORURINE AMBER (A) 08/23/2021 1231   APPEARANCEUR HAZY (A) 08/23/2021 1231   LABSPEC 1.032 (H) 08/23/2021 1231   PHURINE 5.0 08/23/2021 1231   GLUCOSEU NEGATIVE 08/23/2021 1231   HGBUR NEGATIVE 08/23/2021 1231   BILIRUBINUR NEGATIVE 08/23/2021 1231   KETONESUR 20 (A) 08/23/2021 1231   PROTEINUR NEGATIVE 08/23/2021 1231   UROBILINOGEN 0.2 08/05/2018 1202   NITRITE NEGATIVE 08/23/2021 1231   LEUKOCYTESUR NEGATIVE 08/23/2021 1231   Sepsis Labs: @LABRCNTIP (procalcitonin:4,lacticidven:4) )No results found for this or any previous visit (from the past 240 hour(s)).   Radiological Exams on Admission: CT PELVIS WO CONTRAST  Result Date: 10/30/2021 CLINICAL DATA:  Wound assess for osteomyelitis EXAM: CT PELVIS WITHOUT CONTRAST TECHNIQUE: Multidetector CT imaging of the pelvis was performed following the standard protocol without intravenous contrast. RADIATION DOSE REDUCTION: This exam was performed according to the departmental dose-optimization program which includes automated exposure control, adjustment of the  mA and/or kV according to patient size and/or use of iterative reconstruction technique. COMPARISON:  CT 08/22/2021 FINDINGS: Urinary Tract:  No abnormality visualized. Bowel: Large right inguinal hernia containing colon, mesentery and small bowel but no evidence for obstruction. No bowel wall thickening. Appendix visualized within the hernia sac and is without convincing inflammation. Vascular/Lymphatic: Aortic atherosclerosis. No aneurysm. No suspicious lymph nodes Reproductive:  Enlarged prostate Other:  Negative for pelvic effusion or free air Musculoskeletal: No fracture. No frank osseous destructive change. Sacral decubitus ulcer. Multiple foci of gas within the soft tissues overlying the sacrum, likely related to wound. Cranial extension of fluid and gas collection at least to the level of L4, this measures approximately 8.8 cm transverse by 1.3 cm thick. There is soft tissue stranding in the subcutaneous fat. IMPRESSION: 1. No definite osseous destructive change at the sacrococcygeal region however if osteomyelitis remains a concern, follow-up MRI should be obtained. 2. Sacral decubitus ulcer. Crescent shaped gas and fluid collection extending cranially at least to the level of L4, findings are suspicious for soft tissue abscess. 3. Large right groin hernia without obstruction Electronically Signed   By: Donavan Foil M.D.   On: 10/25/2021 19:40      Assessment/Plan Principal Problem:   Back abscess Active Problems:   Memory loss   Supranuclear palsy (HCC)   Inguinal hernia   Adult failure to thrive syndrome    Stage IV sacral cubitus ulcer with abscess for which patient was started on empiric antibiotics does not appear to be septic at this time.  We will consult with general surgery along with wound team.  Likely may need debridement. Prior history of stroke.  No acute issues at this time. Minimally communicative with chronic encephalopathy as per the daughter patient is at baseline.  We  need complete assistance including for feeding. Severe protein calorie malnutrition will need nutrition counseling.  Added protein shakes his diet. Elevated LFTs further work-up as outpatient for LFTs.  Since patient has sacral decubitus ulcer with abscess will need further management inpatient status.  DVT prophylaxis: Heparin. Code Status: DNR confirmed with patient's daughter. Family Communication: Patient's daughter. Disposition Plan: Back to facility when stable. Consults called: Will need general surgery consult. Admission status: Inpatient.   Rise Patience MD [facility] Pager [phone].  If 7PM-7AM, please contact night-coverage [email or web address] Password [facility]  11/05/2021, 11:30 PM

## 2021-11-09 NOTE — ED Triage Notes (Signed)
Patient BIB EMS from [facility]. Patient has sacral wound for about a week. Patient has been receiving wound care and has been seeing some improvement, however, nurse at [facility] would like patient further evaluated. Patient has not complaints at this time.   Vital Signs Were: 102/70 116-CBG 98% on Room air [id number]-RR

## 2021-11-10 DIAGNOSIS — L02212 Cutaneous abscess of back [any part, except buttock]: Secondary | ICD-10-CM | POA: Diagnosis not present

## 2021-11-10 LAB — GLUCOSE, CAPILLARY
Glucose-Capillary: 122 mg/dL — ABNORMAL HIGH (ref 70–99)
Glucose-Capillary: 115 mg/dL — ABNORMAL HIGH (ref 70–99)
Glucose-Capillary: 135 mg/dL — ABNORMAL HIGH (ref 70–99)
Glucose-Capillary: 138 mg/dL — ABNORMAL HIGH (ref 70–99)

## 2021-11-10 LAB — BASIC METABOLIC PANEL
Anion gap: 7 (ref 5–15)
BUN: 24 mg/dL — ABNORMAL HIGH (ref 8–23)
CO2: 28 mmol/L (ref 22–32)
Calcium: 8.5 mg/dL — ABNORMAL LOW (ref 8.9–10.3)
Chloride: 100 mmol/L (ref 98–111)
Creatinine, Ser: 0.68 mg/dL (ref 0.61–1.24)
GFR, Estimated: >60 mL/min (ref >60)
Glucose, Bld: 129 mg/dL — ABNORMAL HIGH (ref 70–99)
Potassium: 4 mmol/L (ref 3.5–5.1)
Sodium: 135 mmol/L (ref 135–145)

## 2021-11-10 LAB — CBC
HCT: 35.1 % — ABNORMAL LOW (ref 39.0–52.0)
Hemoglobin: 11.4 g/dL — ABNORMAL LOW (ref 13.0–17.0)
MCH: 32 pg (ref 26.0–34.0)
MCHC: 32.5 g/dL (ref 30.0–36.0)
MCV: 98.6 fL (ref 80.0–100.0)
Platelets: 444 10*3/uL — ABNORMAL HIGH (ref 150–400)
RBC: 3.56 MIL/uL — ABNORMAL LOW (ref 4.22–5.81)
RDW: 14.1 % (ref 11.5–15.5)
WBC: 13.6 10*3/uL — ABNORMAL HIGH (ref 4.0–10.5)
nRBC: 0 % (ref 0.0–0.2)

## 2021-11-10 MED ORDER — METRONIDAZOLE 500 MG/100ML IV SOLN
500.0000 mg | Freq: Two times a day (BID) | INTRAVENOUS | Status: DC
  Administered 2021-11-10 – 2021-11-14 (×9): 500 mg via INTRAVENOUS
  Filled 2021-11-10 (×9): qty 100

## 2021-11-10 MED ORDER — COLLAGENASE 250 UNIT/GM EX OINT
TOPICAL_OINTMENT | Freq: Two times a day (BID) | CUTANEOUS | Status: DC
  Filled 2021-11-10: qty 30

## 2021-11-10 NOTE — Progress Notes (Deleted)
Patient being transferred to [facility] place, report given to receiving RN

## 2021-11-10 NOTE — Progress Notes (Signed)
PT Cancellation Note  Patient Details Name: EPHRAM KORNEGAY MRN: [id number] DOB: [birth year]   Cancelled Treatment:    Reason Eval/Treat Not Completed: Patient declined, no reason specified;Other (comment) (pt requesting not today, will follow up at later date/time for hydro eval/treat.)   Renaldo Fiddler PT, DPT Acute Rehabilitation Services Office [phone] Pager [phone]  11/10/21 2:48 PM

## 2021-11-10 NOTE — Consult Note (Signed)
WOC Nurse Consult Note: Reason for Consult:Stage 4 sacral pressure injury with infection. WOC Nursing is simultaneously consulted with Surgery. They have not yet seen. I will provide conservative care orders until surgery evaluates. Wound type:pressure vs infectious Pressure Injury POA: Yes Measurement:6cm x 2.5cm per Nursing Flow Sheet Wound bed:red Drainage (amount, consistency, odor) large purulent Periwound:erythematous Dressing procedure/placement/frequency:  CT of the pelvis without contract results:  Sacral decubitus ulcer. Multiple foci of gas within the soft tissues overlying the sacrum, likely related to wound. Cranial extension of fluid and gas collection at least to the level of L4, this measures approximately 8.8 cm transverse by 1.3 cm thick. There is soft tissue stranding in the subcutaneous fat. IMPRESSION: 1. No definite osseous destructive change at the sacrococcygeal region however if osteomyelitis remains a concern, follow-up MRI should be obtained. 2. Sacral decubitus ulcer. Crescent shaped gas and fluid collection extending cranially at least to the level of L4, findings are suspicious for soft tissue abscess. 3. Large right groin hernia without obstruction Electronically Signed   By: Jasmine Pang M.D.   Wound is to be cleansed with NS, and dead space loosely filled with saline moistened gauze. This is to be topped with dry gauze, an ABD pad and secured with paper tape.  A mattress replacement is to be placed for pressure redistribution. Heel boots are provided.  Turning and repositioning is in place.   WOC nursing team will not follow, but will remain available to this patient, the nursing and medical teams.  Please re-consult if needed.  Thank you for inviting [place] to participate in this patient's Plan of Care.  Ladona Mow, MSN, RN, CNS, GNP, Leda Min, [employer], [employer] phone:  [phone]

## 2021-11-10 NOTE — Consult Note (Signed)
Consult Note  David Scott [birth year]  [id number].    Requesting MD: David Squires, MD Chief Complaint/Reason for Consult: sacral decubitus ulcer  HPI:  [name] is a 81 y/o M with a history of CVA, failure to thrive, and chronic encephalopathy who presented to the hospital from a skilled nursing facility for evaluation of a sacral decubitus ulcer. He is having low grade temperatures of 100.4. No family at bedside this AM. Unclear what wound care has been at [facility]. General surgery consulted for possible debridement.   ROS:  Review of Systems  Unable to perform ROS: Mental acuity    Family History  Problem Relation Age of Onset   COPD Father     Past Medical History:  Diagnosis Date   Aortic atherosclerosis ([place]) 06/11/2021   Bradycardia 06/11/2021   CVA (cerebral vascular accident) [facility])    Esophageal reflux    Leg fracture    Lethargy 09/30/2021   Shortness of breath 03/03/2021   Skin lesion 03/03/2021    History reviewed. No pertinent surgical history.  Social History:  reports that he has never smoked. He has never used smokeless tobacco. He reports that he does not drink alcohol and does not use drugs.  Allergies:  Allergies  Allergen Reactions   Benzonatate     Other reaction(s): vivid dreams   Tamsulosin Hcl     Other reaction(s): dizzy/achy   Septra [Sulfamethoxazole-Trimethoprim] Rash    Pt states MD had him taking Sulfa by the handful when he had prostate infection, says they don't know what caused the rash    Medications Prior to Admission  Medication Sig Dispense Refill   acetaminophen (TYLENOL) 325 MG tablet Take 2 tablets (650 mg total) by mouth every 6 (six) hours as needed for mild pain (or Fever >/= 101).     albuterol (PROVENTIL HFA;VENTOLIN HFA) 108 (90 Base) MCG/ACT inhaler Inhale 2 puffs into the lungs every 6 (six) hours as needed for wheezing or shortness of breath.      aspirin 81 MG tablet Take 81 mg by mouth daily after lunch.       Cholecalciferol (VITAMIN D-3 PO) Take 1 capsule by mouth daily after lunch.      hydroxypropyl methylcellulose / hypromellose (ISOPTO TEARS / GONIOVISC) 2.5 % ophthalmic solution Place 1 drop into both eyes 2 (two) times daily.     Lactobacillus (PROBIOTIC ACIDOPHILUS PO) Take 1 capsule by mouth daily.     Magnesium 250 MG TABS Take 250 mg by mouth daily.     melatonin 3 MG TABS tablet Take 3 mg by mouth at bedtime.     mirtazapine (REMERON SOL-TAB) 15 MG disintegrating tablet Take 1 tablet (15 mg total) by mouth at bedtime.     Multiple Vitamin (MULTIVITAMIN) tablet Take 1 tablet by mouth daily.     pantoprazole (PROTONIX) 40 MG tablet Take 1 tablet (40 mg total) by mouth daily.     POLYETHYLENE GLYCOL 3350 PO Take by mouth. MIX 1 CAPFUL (17GM) WITH 4-8 OZ OF BEVERAGE OF CHOICE AND DRINK BY MOUTH ONCE DAILY FOR CONSTIPATION     Propylene Glycol (SYSTANE BALANCE OP) Place 1 drop into both eyes daily.     thiamine 100 MG tablet Take 1 tablet (100 mg total) by mouth daily.     vitamin C (ASCORBIC ACID) 500 MG tablet Take 1,000 mg by mouth daily.     Amino Acids-Protein Hydrolys (FEEDING SUPPLEMENT, PRO-STAT SUGAR FREE 64,) LIQD Take 30  mLs by mouth 3 (three) times daily with meals. For wound healing     feeding supplement (ENSURE ENLIVE / ENSURE PLUS) LIQD Take 237 mLs by mouth 3 (three) times daily between meals. 237 mL 12   lactose free nutrition (BOOST PLUS) LIQD Take 237 mLs by mouth 3 (three) times daily with meals.  0   NON FORMULARY DIET: REGULAR DIET NAS HEART HEALTHY/ THIN LIQUIDS      Blood pressure 125/71, pulse (!) 54, temperature (!) 100.4 F (38 C), temperature source Axillary, resp. rate 16, height 5\' 10"  (1.778 m), weight 53 kg, SpO2 97 %. Physical Exam:  General: non-verbal, chronically ill appearing male HEENT: head is normocephalic, atraumatic.  Heart: regular, rate, and rhythm.  Normal s1,s2. No obvious murmurs, gallops, or rubs noted.  no lower extremity edema Lungs: CTAB,  no wheezes, rhonchi, or rales noted.  Respiratory effort nonlabored Abd: soft, NT, ND, no masses, hernias, or organomegaly GU: sacral ulcer as below - wound base > 50% granulation tissue with overlying fibrinopurulent exudate. Wound undermines 13 cm superiorly. There is < 4 cm of surrounding blanching erythema. No significant purulent draiange     Results for orders placed or performed during the hospital encounter of 11/03/2021 (from the past 48 hour(s))  CBC with Differential     Status: Abnormal   Collection Time: 10/27/2021  3:37 PM  Result Value Ref Range   WBC 13.2 (H) 4.0 - 10.5 K/uL   RBC 3.53 (L) 4.22 - 5.81 MIL/uL   Hemoglobin 11.1 (L) 13.0 - 17.0 g/dL   HCT 11/11/21 (L) 01.0 - 93.2 %   MCV 98.0 80.0 - 100.0 fL   MCH 31.4 26.0 - 34.0 pg   MCHC 32.1 30.0 - 36.0 g/dL   RDW 35.5 73.2 - 20.2 %   Platelets 479 (H) 150 - 400 K/uL   nRBC 0.0 0.0 - 0.2 %   Neutrophils Relative % 87 %   Neutro Abs 11.6 (H) 1.7 - 7.7 K/uL   Lymphocytes Relative 6 %   Lymphs Abs 0.7 0.7 - 4.0 K/uL   Monocytes Relative 5 %   Monocytes Absolute 0.7 0.1 - 1.0 K/uL   Eosinophils Relative 1 %   Eosinophils Absolute 0.1 0.0 - 0.5 K/uL   Basophils Relative 0 %   Basophils Absolute 0.0 0.0 - 0.1 K/uL   Immature Granulocytes 1 %   Abs Immature Granulocytes 0.06 0.00 - 0.07 K/uL    Comment: Performed at [facility], [address], [place], [place]  Comprehensive metabolic panel     Status: Abnormal   Collection Time: 11/03/2021  3:37 PM  Result Value Ref Range   Sodium 136 135 - 145 mmol/L   Potassium 4.2 3.5 - 5.1 mmol/L   Chloride 101 98 - 111 mmol/L   CO2 28 22 - 32 mmol/L   Glucose, Bld 151 (H) 70 - 99 mg/dL    Comment: Glucose reference range applies only to samples taken after fasting for at least 8 hours.   BUN 24 (H) 8 - 23 mg/dL   Creatinine, Ser 11/11/21 0.61 - 1.24 mg/dL   Calcium 8.0 (L) 8.9 - 10.3 mg/dL   Total Protein 6.1 (L) 6.5 - 8.1 g/dL   Albumin 2.2 (L) 3.5 -  5.0 g/dL   AST 54 (H) 15 - 41 U/L   ALT 69 (H) 0 - 44 U/L   Alkaline Phosphatase 93 38 - 126 U/L   Total Bilirubin 0.4 0.3 -  1.2 mg/dL   GFR, Estimated >60 >60 mL/min    Comment: (NOTE) Calculated using the CKD-EPI Creatinine Equation (2021)    Anion gap 7 5 - 15    Comment: Performed at [facility], [place] [address], [place], [place] [id number]  Glucose, capillary     Status: Abnormal   Collection Time: 11/10/21  6:00 AM  Result Value Ref Range   Glucose-Capillary 122 (H) 70 - 99 mg/dL    Comment: Glucose reference range applies only to samples taken after fasting for at least 8 hours.  Basic metabolic panel     Status: Abnormal   Collection Time: 11/10/21  6:50 AM  Result Value Ref Range   Sodium 135 135 - 145 mmol/L   Potassium 4.0 3.5 - 5.1 mmol/L   Chloride 100 98 - 111 mmol/L   CO2 28 22 - 32 mmol/L   Glucose, Bld 129 (H) 70 - 99 mg/dL    Comment: Glucose reference range applies only to samples taken after fasting for at least 8 hours.   BUN 24 (H) 8 - 23 mg/dL   Creatinine, Ser 0.68 0.61 - 1.24 mg/dL   Calcium 8.5 (L) 8.9 - 10.3 mg/dL   GFR, Estimated >60 >60 mL/min    Comment: (NOTE) Calculated using the CKD-EPI Creatinine Equation (2021)    Anion gap 7 5 - 15    Comment: Performed at [facility], [place] [address] [place], [place] [address]  CBC     Status: Abnormal   Collection Time: 11/10/21  6:50 AM  Result Value Ref Range   WBC 13.6 (H) 4.0 - 10.5 K/uL   RBC 3.56 (L) 4.22 - 5.81 MIL/uL   Hemoglobin 11.4 (L) 13.0 - 17.0 g/dL   HCT 35.1 (L) 39.0 - 52.0 %   MCV 98.6 80.0 - 100.0 fL   MCH 32.0 26.0 - 34.0 pg   MCHC 32.5 30.0 - 36.0 g/dL   RDW 14.1 11.5 - 15.5 %   Platelets 444 (H) 150 - 400 K/uL   nRBC 0.0 0.0 - 0.2 %    Comment: Performed at [facility], [place] [address] [place], [place] [address]   CT PELVIS WO CONTRAST  Result Date: 11/03/2021 CLINICAL DATA:  Wound assess for osteomyelitis EXAM: CT  PELVIS WITHOUT CONTRAST TECHNIQUE: Multidetector CT imaging of the pelvis was performed following the standard protocol without intravenous contrast. RADIATION DOSE REDUCTION: This exam was performed according to the departmental dose-optimization program which includes automated exposure control, adjustment of the mA and/or kV according to patient size and/or use of iterative reconstruction technique. COMPARISON:  CT 08/22/2021 FINDINGS: Urinary Tract:  No abnormality visualized. Bowel: Large right inguinal hernia containing colon, mesentery and small bowel but no evidence for obstruction. No bowel wall thickening. Appendix visualized within the hernia sac and is without convincing inflammation. Vascular/Lymphatic: Aortic atherosclerosis. No aneurysm. No suspicious lymph nodes Reproductive:  Enlarged prostate Other:  Negative for pelvic effusion or free air Musculoskeletal: No fracture. No frank osseous destructive change. Sacral decubitus ulcer. Multiple foci of gas within the soft tissues overlying the sacrum, likely related to wound. Cranial extension of fluid and gas collection at least to the level of L4, this measures approximately 8.8 cm transverse by 1.3 cm thick. There is soft tissue stranding in the subcutaneous fat. IMPRESSION: 1. No definite osseous destructive change at the sacrococcygeal region however if osteomyelitis remains a concern, follow-up MRI should be obtained. 2. Sacral decubitus ulcer. Crescent shaped  gas and fluid collection extending cranially at least to the level of L4, findings are suspicious for soft tissue abscess. 3. Large right groin hernia without obstruction Electronically Signed   By: Jasmine Pang M.D.   On: 11-Nov-2021 19:40      Assessment/Plan Stage IV sacral decubitus ulcer - TMAX 100.4, WBC 13.6 - CT pelvis 7/17 w/ sacral decubitus ulcer with underlying gas/fluid collection. This was probed at the bedside and no undrained cavity noted  - no surgical needs at this  time, continue to offload direct pressure over wound when able - general surgery will not follow acutely  - twice daily moist-to-dry dressing changes, taking note of wound tunneling superiorly - ordered santyl and PT hydrotherapy can be done a few times per week while admitted but do not need to keep admitted for this  - abx per primary service - recommend palliative consult   FEN: Reg ID: Cefepime, Flagyl, Vanc; BCx pending  Foley:external cath VTE: SCD's, SQH  PMH CVA Chronic encephalopathy Failure to thrive      I reviewed nursing notes, ED provider notes, hospitalist notes, last 24 h vitals and pain scores, last 48 h intake and output, last 24 h labs and trends, and last 24 h imaging results.    Juliet Rude, [facility] 11/10/2021, 9:29 AM Please see Amion for pager number during day hours 7:00am-4:30pm

## 2021-11-10 NOTE — TOC Initial Note (Signed)
Transition of Care [facility]) - Initial/Assessment Note   Patient Details  Name: David Scott MRN: [id number] Date of Birth: 10-Jun-1940  Transition of Care [facility]) CM/SW Contact:    David Schlein, LCSW Phone Number: 11/10/2021, 12:56 PM  Clinical Narrative: [facility] CSW confirmed with David Scott in admissions that patient was there for rehab and the plan is for the patient to return when medically ready. CSW spoke with patient's son, David Scott, and he also confirmed the plan is for patient to return to [facility] to continue rehab.  Expected Discharge Plan: Skilled Nursing Facility Barriers to Discharge: Continued Medical Work up  Patient Goals and CMS Choice Patient states their goals for this hospitalization and ongoing recovery are:: Return to [facility] for rehab  Expected Discharge Plan and Services Expected Discharge Plan: Skilled Nursing Facility In-house Referral: Clinical Social Work Post Acute Care Choice: Skilled Nursing Facility Living arrangements for the past 2 months: Skilled Nursing Facility             DME Arranged: N/A DME Agency: NA  Prior Living Arrangements/Services Living arrangements for the past 2 months: Skilled Nursing Facility Lives with:: Facility Resident Patient language and need for interpreter reviewed:: Yes Need for Family Participation in Patient Care: Yes (Comment) Care giver support system in place?: Yes (comment) Criminal Activity/Legal Involvement Pertinent to Current Situation/Hospitalization: No - Comment as needed  Emotional Assessment Orientation: : Oriented to Self Alcohol / Substance Use: Not Applicable  Admission diagnosis:  Back abscess [L02.212] Patient Active Problem List   Diagnosis Date Noted   Back abscess 11/14/2021   Lethargy 09/30/2021   Adult failure to thrive syndrome 09/01/2021   Acute metabolic encephalopathy 08/23/2021   Pressure injury of skin 08/23/2021   Inguinal hernia 08/23/2021   Frequent  falls 08/23/2021   Proximal humerus fracture 08/23/2021   Renal insufficiency 08/23/2021   Macrocytic anemia 08/23/2021   History of stroke with residual deficit 08/23/2021   Bradycardia 06/11/2021   Aortic atherosclerosis ([facility]) 06/11/2021   Skin lesion 03/03/2021   Shortness of breath 03/03/2021   Supranuclear palsy ([facility]) 05/10/2018   Stroke ([facility]) 04/12/2017   Memory loss 02/03/2017   Gait disturbance 02/03/2017   Polyneuropathy 02/03/2017   Numbness 02/03/2017   PCP:  David Else, MD Pharmacy:   [id number] - [place] ([place]), [place] - [address] [place] ([place]) [place] [address] Phone: [phone] Fax: [phone]  Readmission Risk Interventions     No data to display

## 2021-11-10 NOTE — Progress Notes (Signed)
PROGRESS NOTE    David Scott  YNW:[id number] DOB: 1941-04-14 DOA: 2021-11-23 PCP: Elias Else, MD    Brief Narrative:  David Scott is a 81 y.o. male with history of stroke, severe protein calorie malnutrition, failure to thrive who has been bedbound since his last fall in May was noticed to have worsening sacral decubitus ulcer as seen in a skilled nursing facility.      Assessment and Plan: Stage IV sacral cubitus ulcer with abscess for which patient was started on empiric antibiotics does not appear to be septic at this time.  -continue abx  -no need for surgery currently-- general surgery following -wound care  Prior history of stroke.  No acute issues at this time. -poor overall prognosis  Minimally communicative with chronic encephalopathy as per the daughter patient is at baseline.  We need complete assistance including for feeding. -will discuss goc and palliative care  Severe protein calorie malnutrition  -this will interfere with his ability to heal  Elevated LFTs  -further work-up as outpatient for LFTs.   DVT prophylaxis: heparin injection 5,000 Units Start: 11/10/21 0600    Code Status: DNR Family Communication:   Disposition Plan:  Level of care: Med-Surg Status is: Inpatient Remains inpatient appropriate because: needs further care    Consultants:  General surgery   Subjective: Non-verbal  Objective: Vitals:   11/10/21 0353 11/10/21 0430 11/10/21 0531 11/10/21 0914  BP:  123/73 (!) 149/67 125/71  Pulse:  63 70 (!) 54  Resp:  15 18 16   Temp: 98.3 F (36.8 C)  98.6 F (37 C) (!) 100.4 F (38 C)  TempSrc: Oral  Oral Axillary  SpO2:  95% 97%   Weight:      Height:        Intake/Output Summary (Last 24 hours) at 11/10/2021 1151 Last data filed at 11/10/2021 1000 Gross per 24 hour  Intake 0 ml  Output 100 ml  Net -100 ml   Filed Weights   2021-11-23 1519  Weight: 53 kg    Examination:   General: Appearance:    Frail male  in no acute distress     Lungs:     respirations unlabored  Heart:    Bradycardic.    MS:   All extremities are intact.    Neurologic:   Awake. Non-verbal       Data Reviewed: I have personally reviewed following labs and imaging studies  CBC: Recent Labs  Lab 23-Nov-2021 1537 11/10/21 0650  WBC 13.2* 13.6*  NEUTROABS 11.6*  --   HGB 11.1* 11.4*  HCT 34.6* 35.1*  MCV 98.0 98.6  PLT 479* 444*   Basic Metabolic Panel: Recent Labs  Lab November 23, 2021 1537 11/10/21 0650  NA 136 135  K 4.2 4.0  CL 101 100  CO2 28 28  GLUCOSE 151* 129*  BUN 24* 24*  CREATININE 0.76 0.68  CALCIUM 8.0* 8.5*   GFR: Estimated Creatinine Clearance: 57 mL/min (by C-G formula based on SCr of 0.68 mg/dL). Liver Function Tests: Recent Labs  Lab 11/23/2021 1537  AST 54*  ALT 69*  ALKPHOS 93  BILITOT 0.4  PROT 6.1*  ALBUMIN 2.2*   No results for input(s): "LIPASE", "AMYLASE" in the last 168 hours. No results for input(s): "AMMONIA" in the last 168 hours. Coagulation Profile: No results for input(s): "INR", "PROTIME" in the last 168 hours. Cardiac Enzymes: No results for input(s): "CKTOTAL", "CKMB", "CKMBINDEX", "TROPONINI" in the last 168 hours. BNP (last 3 results) No results  for input(s): "PROBNP" in the last 8760 hours. HbA1C: No results for input(s): "HGBA1C" in the last 72 hours. CBG: Recent Labs  Lab 11/10/21 0600 11/10/21 1124  GLUCAP 122* 115*   Lipid Profile: No results for input(s): "CHOL", "HDL", "LDLCALC", "TRIG", "CHOLHDL", "LDLDIRECT" in the last 72 hours. Thyroid Function Tests: No results for input(s): "TSH", "T4TOTAL", "FREET4", "T3FREE", "THYROIDAB" in the last 72 hours. Anemia Panel: No results for input(s): "VITAMINB12", "FOLATE", "FERRITIN", "TIBC", "IRON", "RETICCTPCT" in the last 72 hours. Sepsis Labs: No results for input(s): "PROCALCITON", "LATICACIDVEN" in the last 168 hours.  No results found for this or any previous visit (from the past 240 hour(s)).        Radiology Studies: CT PELVIS WO CONTRAST  Result Date: December 05, 2021 CLINICAL DATA:  Wound assess for osteomyelitis EXAM: CT PELVIS WITHOUT CONTRAST TECHNIQUE: Multidetector CT imaging of the pelvis was performed following the standard protocol without intravenous contrast. RADIATION DOSE REDUCTION: This exam was performed according to the departmental dose-optimization program which includes automated exposure control, adjustment of the mA and/or kV according to patient size and/or use of iterative reconstruction technique. COMPARISON:  CT 08/22/2021 FINDINGS: Urinary Tract:  No abnormality visualized. Bowel: Large right inguinal hernia containing colon, mesentery and small bowel but no evidence for obstruction. No bowel wall thickening. Appendix visualized within the hernia sac and is without convincing inflammation. Vascular/Lymphatic: Aortic atherosclerosis. No aneurysm. No suspicious lymph nodes Reproductive:  Enlarged prostate Other:  Negative for pelvic effusion or free air Musculoskeletal: No fracture. No frank osseous destructive change. Sacral decubitus ulcer. Multiple foci of gas within the soft tissues overlying the sacrum, likely related to wound. Cranial extension of fluid and gas collection at least to the level of L4, this measures approximately 8.8 cm transverse by 1.3 cm thick. There is soft tissue stranding in the subcutaneous fat. IMPRESSION: 1. No definite osseous destructive change at the sacrococcygeal region however if osteomyelitis remains a concern, follow-up MRI should be obtained. 2. Sacral decubitus ulcer. Crescent shaped gas and fluid collection extending cranially at least to the level of L4, findings are suspicious for soft tissue abscess. 3. Large right groin hernia without obstruction Electronically Signed   By: Jasmine Pang M.D.   On: December 05, 2021 19:40        Scheduled Meds:  vitamin C  1,000 mg Oral Daily   aspirin  81 mg Oral QPC lunch   feeding supplement   237 mL Oral TID BM   heparin  5,000 Units Subcutaneous Q8H   magnesium oxide  200 mg Oral Daily   mirtazapine  15 mg Oral QHS   multivitamin with minerals  1 tablet Oral Daily   pantoprazole  40 mg Oral Daily   polyvinyl alcohol  1 drop Both Eyes BID   thiamine  100 mg Oral Daily   Continuous Infusions:  ceFEPime (MAXIPIME) IV     dextrose 5% lactated ringers 50 mL/hr at 11/10/21 0031   metronidazole     vancomycin       LOS: 1 day    Time spent: 45 minutes spent on chart review, discussion with nursing staff, consultants, updating family and interview/physical exam; more than 50% of that time was spent in counseling and/or coordination of care.    Joseph Art, DO [facility] Available via Epic secure chat 7am-7pm After these hours, please refer to coverage provider listed on [email or web address] 11/10/2021, 11:51 AM

## 2021-11-11 DIAGNOSIS — L02212 Cutaneous abscess of back [any part, except buttock]: Secondary | ICD-10-CM | POA: Diagnosis not present

## 2021-11-11 LAB — GLUCOSE, CAPILLARY
Glucose-Capillary: 118 mg/dL — ABNORMAL HIGH (ref 70–99)
Glucose-Capillary: 127 mg/dL — ABNORMAL HIGH (ref 70–99)
Glucose-Capillary: 131 mg/dL — ABNORMAL HIGH (ref 70–99)
Glucose-Capillary: 138 mg/dL — ABNORMAL HIGH (ref 70–99)

## 2021-11-11 NOTE — Plan of Care (Signed)
  Problem: Clinical Measurements: Goal: Diagnostic test results will improve Outcome: Progressing   Problem: Nutrition: Goal: Adequate nutrition will be maintained Outcome: Progressing   

## 2021-11-11 NOTE — Progress Notes (Addendum)
PT Cancellation Note  Patient Details Name: David Scott MRN: [id number] DOB: [birth year]   Cancelled Treatment:    Reason Eval/Treat Not Completed:Order received. Chart reviewed. PT will wait to start hydrotherapy, if pt/family are agreeable, after palliative consults with patient and family. Asked patient if he was agreeable to therapy starting today but he could/would not answer "yes" or "no". And, per nursing, he has refused dressing change last night and labs this a.m.    Faye Ramsay, PT Acute Rehabilitation  Office: [phone] Pager: [phone]

## 2021-11-11 NOTE — Progress Notes (Addendum)
PROGRESS NOTE    David Scott  [id number] DOB: 1940/09/25 DOA: 11/04/2021 PCP: Maury Dus, MD    Brief Narrative:  David Scott is a 81 y.o. male with history of stroke, severe protein calorie malnutrition, failure to thrive who has been bedbound since his last fall in May was noticed to have worsening sacral decubitus ulcer as seen in a skilled nursing facility.    Per RN "patient is refusing all blood draws, medications, and wound care" Palliative care consulted   Assessment and Plan:  Stage IV sacral cubitus ulcer with abscess for which patient was started on empiric antibiotics does not appear to be septic at this time.  Reportedly has been bed to wheelchair ridden for about 80months -continue abx  -no need for surgery currently-- general surgery following -wound care -palliative care   Prior history of stroke.   eportedly has been bed to wheelchair ridden , total care for about 37months -poor overall prognosis  Minimally communicative with chronic encephalopathy as per the daughter patient is at baseline.   Total , need complete assistance including for feeding. -will discuss goc and palliative care  Severe protein calorie malnutrition  Physical exam revealed significant fat depletion, muscle atrophy  -this will interfere with his ability to heal  Elevated LFTs  -possibly due to underline infection , will check ab [place], ammonia level and ck    DVT prophylaxis: heparin injection 5,000 Units Start: 11/10/21 0600    Code Status: DNR Family Communication: daughter over the phone, daughter states her brother is HPOA, she will send message to his brother to return call  Disposition Plan:  Level of care: Med-Surg Status is: Inpatient Remains inpatient appropriate because: needs further care    Consultants:  General surgery Palliative care   Subjective: Non-verbal, open eyes briefly,  Per RN "patient is refusing all blood draws, medications, and wound  care"  Objective: Vitals:   11/10/21 1444 11/10/21 1711 11/10/21 2121 11/11/21 0419  BP: 127/60 (!) 137/107 124/66 123/81  Pulse: 63 71 (!) 59 (!) 57  Resp:  18 16 18   Temp: 100.2 F (37.9 C) 99.7 F (37.6 C) 97.7 F (36.5 C) 98.6 F (37 C)  TempSrc: Axillary Axillary Oral Oral  SpO2: 100% 96% 94% 96%  Weight:      Height:        Intake/Output Summary (Last 24 hours) at 11/11/2021 0956 Last data filed at 11/11/2021 0441 Gross per 24 hour  Intake 600 ml  Output 1400 ml  Net -800 ml   Filed Weights   11/19/2021 1519  Weight: 53 kg    Examination:   General: Appearance:    Frail male , nonverbal, temporal wasting, significant muscle wasting , fat depletion      Lungs:     respirations unlabored  Heart:    Bradycardic.    MS:   Muscle wasting, left hemiparesis, left hand 4digits, bilateral legs in pressor off loading boots   Neurologic:   lethargic. Non-verbal       Data Reviewed: I have personally reviewed following labs and imaging studies  CBC: Recent Labs  Lab 10/28/2021 1537 11/10/21 0650  WBC 13.2* 13.6*  NEUTROABS 11.6*  --   HGB 11.1* 11.4*  HCT 34.6* 35.1*  MCV 98.0 98.6  PLT 479* [id number]*   Basic Metabolic Panel: Recent Labs  Lab 11/11/2021 1537 11/10/21 0650  NA 136 135  K 4.2 4.0  CL 101 100  CO2 28 28  GLUCOSE 151* 129*  BUN 24* 24*  CREATININE 0.76 0.68  CALCIUM 8.0* 8.5*   GFR: Estimated Creatinine Clearance: 57 mL/min (by C-G formula based on SCr of 0.68 mg/dL). Liver Function Tests: Recent Labs  Lab 11/18/2021 1537  AST 54*  ALT 69*  ALKPHOS 93  BILITOT 0.4  PROT 6.1*  ALBUMIN 2.2*   No results for input(s): "LIPASE", "AMYLASE" in the last 168 hours. No results for input(s): "AMMONIA" in the last 168 hours. Coagulation Profile: No results for input(s): "INR", "PROTIME" in the last 168 hours. Cardiac Enzymes: No results for input(s): "CKTOTAL", "CKMB", "CKMBINDEX", "TROPONINI" in the last 168 hours. BNP (last 3 results) No  results for input(s): "PROBNP" in the last 8760 hours. HbA1C: No results for input(s): "HGBA1C" in the last 72 hours. CBG: Recent Labs  Lab 11/10/21 1124 11/10/21 1709 11/10/21 2139 11/11/21 0018 11/11/21 0438  GLUCAP 115* 135* 138* 127* 131*   Lipid Profile: No results for input(s): "CHOL", "HDL", "LDLCALC", "TRIG", "CHOLHDL", "LDLDIRECT" in the last 72 hours. Thyroid Function Tests: No results for input(s): "TSH", "T4TOTAL", "FREET4", "T3FREE", "THYROIDAB" in the last 72 hours. Anemia Panel: No results for input(s): "VITAMINB12", "FOLATE", "FERRITIN", "TIBC", "IRON", "RETICCTPCT" in the last 72 hours. Sepsis Labs: No results for input(s): "PROCALCITON", "LATICACIDVEN" in the last 168 hours.  Recent Results (from the past 240 hour(s))  Blood culture (routine x 2)     Status: None (Preliminary result)   Collection Time: 10/31/2021  6:40 PM   Specimen: BLOOD  Result Value Ref Range Status   Specimen Description   Final    BLOOD BLOOD RIGHT WRIST Performed at [facility], [address] [place], [place] [address]    Special Requests   Final    BOTTLES DRAWN AEROBIC AND ANAEROBIC Blood Culture adequate volume Performed at [facility], [address], [place], [place] [address]    Culture   Final    NO GROWTH 2 DAYS Performed at [facility], [address] [place], [place] [address]    Report Status PENDING  Incomplete  Blood culture (routine x 2)     Status: None (Preliminary result)   Collection Time: 11/10/21  6:50 AM   Specimen: BLOOD  Result Value Ref Range Status   Specimen Description   Final    BLOOD LEFT ANTECUBITAL Performed at [facility], [address], [place], [place] [address]    Special Requests   Final    BOTTLES DRAWN AEROBIC AND ANAEROBIC Blood Culture adequate volume Performed at [facility], [address] [place], [place] [address]    Culture   Final    NO  GROWTH < 24 HOURS Performed at [facility], [address], [place], [place] [address]    Report Status PENDING  Incomplete         Radiology Studies: CT PELVIS WO CONTRAST  Result Date: 11/03/2021 CLINICAL DATA:  Wound assess for osteomyelitis EXAM: CT PELVIS WITHOUT CONTRAST TECHNIQUE: Multidetector CT imaging of the pelvis was performed following the standard protocol without intravenous contrast. RADIATION DOSE REDUCTION: This exam was performed according to the departmental dose-optimization program which includes automated exposure control, adjustment of the mA and/or kV according to patient size and/or use of iterative reconstruction technique. COMPARISON:  CT 08/22/2021 FINDINGS: Urinary Tract:  No abnormality visualized. Bowel: Large right inguinal hernia containing colon, mesentery and small bowel but no evidence for obstruction. No bowel wall thickening. Appendix visualized within the hernia sac  and is without convincing inflammation. Vascular/Lymphatic: Aortic atherosclerosis. No aneurysm. No suspicious lymph nodes Reproductive:  Enlarged prostate Other:  Negative for pelvic effusion or free air Musculoskeletal: No fracture. No frank osseous destructive change. Sacral decubitus ulcer. Multiple foci of gas within the soft tissues overlying the sacrum, likely related to wound. Cranial extension of fluid and gas collection at least to the level of L4, this measures approximately 8.8 cm transverse by 1.3 cm thick. There is soft tissue stranding in the subcutaneous fat. IMPRESSION: 1. No definite osseous destructive change at the sacrococcygeal region however if osteomyelitis remains a concern, follow-up MRI should be obtained. 2. Sacral decubitus ulcer. Crescent shaped gas and fluid collection extending cranially at least to the level of L4, findings are suspicious for soft tissue abscess. 3. Large right groin hernia without obstruction Electronically Signed   By: Jasmine Pang M.D.   On:  11-13-2021 19:40        Scheduled Meds:  vitamin C  1,000 mg Oral Daily   aspirin  81 mg Oral QPC lunch   collagenase   Topical BID   feeding supplement  237 mL Oral TID BM   heparin  5,000 Units Subcutaneous Q8H   magnesium oxide  200 mg Oral Daily   mirtazapine  15 mg Oral QHS   multivitamin with minerals  1 tablet Oral Daily   pantoprazole  40 mg Oral Daily   polyvinyl alcohol  1 drop Both Eyes BID   thiamine  100 mg Oral Daily   Continuous Infusions:  ceFEPime (MAXIPIME) IV Stopped (11/10/21 2347)   metronidazole Stopped (11/10/21 2301)   vancomycin Stopped (11/11/21 0123)     LOS: 2 days       Albertine Grates, MD PhD FACP [facility] Available via Epic secure chat 7am-7pm After these hours, please refer to coverage provider listed on [email or web address] 11/11/2021, 9:56 AM

## 2021-11-11 NOTE — Progress Notes (Signed)
Palliative:  Consult received and chart reviewed extensively.  Went to bedside and patient not interactive -unable to discuss goals of care.  Received report from RN.  No family at bedside.  Patient has HCPOA document listing son [name] as primary HCPOA.  Rhoderick Moody at both numbers listed in chart with no answer, left voicemails at each number with no return call so far.  Gerlean Ren, DNP, AGNP-C Palliative Medicine Team Team Phone # [phone]  Pager # [phone]  NO CHARGE

## 2021-11-12 ENCOUNTER — Inpatient Hospital Stay (HOSPITAL_COMMUNITY): Payer: Medicare Other

## 2021-11-12 DIAGNOSIS — L89150 Pressure ulcer of sacral region, unstageable: Secondary | ICD-10-CM

## 2021-11-12 DIAGNOSIS — Z66 Do not resuscitate: Secondary | ICD-10-CM

## 2021-11-12 DIAGNOSIS — Z7189 Other specified counseling: Secondary | ICD-10-CM

## 2021-11-12 DIAGNOSIS — R627 Adult failure to thrive: Secondary | ICD-10-CM | POA: Diagnosis not present

## 2021-11-12 DIAGNOSIS — Z515 Encounter for palliative care: Secondary | ICD-10-CM

## 2021-11-12 LAB — CBC WITH DIFFERENTIAL/PLATELET
Abs Immature Granulocytes: 0.1 10*3/uL — ABNORMAL HIGH (ref 0.00–0.07)
Basophils Absolute: 0.1 10*3/uL (ref 0.0–0.1)
Basophils Relative: 0 %
Eosinophils Absolute: 0.2 10*3/uL (ref 0.0–0.5)
Eosinophils Relative: 2 %
HCT: 34.2 % — ABNORMAL LOW (ref 39.0–52.0)
Hemoglobin: 10.9 g/dL — ABNORMAL LOW (ref 13.0–17.0)
Immature Granulocytes: 1 %
Lymphocytes Relative: 5 %
Lymphs Abs: 0.7 10*3/uL (ref 0.7–4.0)
MCH: 31.3 pg (ref 26.0–34.0)
MCHC: 31.9 g/dL (ref 30.0–36.0)
MCV: 98.3 fL (ref 80.0–100.0)
Monocytes Absolute: 0.5 10*3/uL (ref 0.1–1.0)
Monocytes Relative: 4 %
Neutro Abs: 11.2 10*3/uL — ABNORMAL HIGH (ref 1.7–7.7)
Neutrophils Relative %: 88 %
Platelets: 451 10*3/uL — ABNORMAL HIGH (ref 150–400)
RBC: 3.48 MIL/uL — ABNORMAL LOW (ref 4.22–5.81)
RDW: 14.2 % (ref 11.5–15.5)
WBC: 12.7 10*3/uL — ABNORMAL HIGH (ref 4.0–10.5)
nRBC: 0 % (ref 0.0–0.2)

## 2021-11-12 LAB — GLUCOSE, CAPILLARY
Glucose-Capillary: 103 mg/dL — ABNORMAL HIGH (ref 70–99)
Glucose-Capillary: 112 mg/dL — ABNORMAL HIGH (ref 70–99)
Glucose-Capillary: 114 mg/dL — ABNORMAL HIGH (ref 70–99)
Glucose-Capillary: 133 mg/dL — ABNORMAL HIGH (ref 70–99)

## 2021-11-12 LAB — COMPREHENSIVE METABOLIC PANEL
ALT: 33 U/L (ref 0–44)
AST: 21 U/L (ref 15–41)
Albumin: 2 g/dL — ABNORMAL LOW (ref 3.5–5.0)
Alkaline Phosphatase: 80 U/L (ref 38–126)
Anion gap: 7 (ref 5–15)
BUN: 17 mg/dL (ref 8–23)
CO2: 26 mmol/L (ref 22–32)
Calcium: 8.3 mg/dL — ABNORMAL LOW (ref 8.9–10.3)
Chloride: 101 mmol/L (ref 98–111)
Creatinine, Ser: 0.64 mg/dL (ref 0.61–1.24)
GFR, Estimated: >60 mL/min (ref >60)
Glucose, Bld: 107 mg/dL — ABNORMAL HIGH (ref 70–99)
Potassium: 3.9 mmol/L (ref 3.5–5.1)
Sodium: 134 mmol/L — ABNORMAL LOW (ref 135–145)
Total Bilirubin: 0.6 mg/dL (ref 0.3–1.2)
Total Protein: 5.4 g/dL — ABNORMAL LOW (ref 6.5–8.1)

## 2021-11-12 LAB — MRSA NEXT GEN BY PCR, NASAL: MRSA by PCR Next Gen: NOT DETECTED

## 2021-11-12 LAB — AMMONIA: Ammonia: 18 umol/L (ref 9–35)

## 2021-11-12 LAB — PHOSPHORUS: Phosphorus: 3 mg/dL (ref 2.5–4.6)

## 2021-11-12 LAB — MAGNESIUM: Magnesium: 1.8 mg/dL (ref 1.7–2.4)

## 2021-11-12 LAB — CK: Total CK: 14 U/L — ABNORMAL LOW (ref 49–397)

## 2021-11-12 MED ORDER — MAGNESIUM SULFATE 2 GM/50ML IV SOLN
2.0000 g | Freq: Once | INTRAVENOUS | Status: AC
  Administered 2021-11-12: 2 g via INTRAVENOUS
  Filled 2021-11-12: qty 50

## 2021-11-12 MED ORDER — SODIUM CHLORIDE 0.9 % IV SOLN: INTRAVENOUS | Status: AC

## 2021-11-12 MED ORDER — ACETAMINOPHEN 325 MG PO TABS
650.0000 mg | ORAL_TABLET | Freq: Four times a day (QID) | ORAL | Status: DC | PRN
  Administered 2021-11-12: 650 mg via ORAL
  Filled 2021-11-12: qty 2

## 2021-11-12 MED ORDER — ENOXAPARIN SODIUM 40 MG/0.4ML IJ SOSY
40.0000 mg | PREFILLED_SYRINGE | INTRAMUSCULAR | Status: DC
  Administered 2021-11-12 – 2021-11-14 (×3): 40 mg via SUBCUTANEOUS
  Filled 2021-11-12 (×3): qty 0.4

## 2021-11-12 MED ORDER — POLYETHYLENE GLYCOL 3350 17 G PO PACK
17.0000 g | PACK | Freq: Every day | ORAL | Status: DC
  Administered 2021-11-12 – 2021-11-14 (×3): 17 g via ORAL
  Filled 2021-11-12 (×3): qty 1

## 2021-11-12 NOTE — Progress Notes (Signed)
[facility] ([facility]) Hospital Liaison Note  Notified by TOC manager of patient/family request for [facility] palliative services at SNF after discharge.   [facility] hospital liaison will follow patient for discharge disposition.   Please call with any hospice or outpatient palliative care related questions.   Thank you for the opportunity to participate in this patient's care.   Shanita Wicker, LCSW [facility] Hospital Liaison [phone]  

## 2021-11-12 NOTE — TOC Progression Note (Signed)
Transition of Care [facility]) - Progression Note   Patient Details  Name: David Scott MRN: [id number] Date of Birth: [birth year]  [facility] CM/SW Contact  Ewing Schlein, LCSW Phone Number: 11/12/2021, 1:23 PM  Clinical Narrative: Palliative recommended OP palliative at SNF. CSW confirmed with Georgeann Oppenheim at [place] that the facility has a palliative program in-house and can follow the patient when he returns.  Expected Discharge Plan: Skilled Nursing Facility Barriers to Discharge: Continued Medical Work up  Expected Discharge Plan and Services Expected Discharge Plan: Skilled Nursing Facility In-house Referral: Clinical Social Work Post Acute Care Choice: Skilled Nursing Facility Living arrangements for the past 2 months: Skilled Nursing Facility             DME Arranged: N/A DME Agency: NA  Readmission Risk Interventions     No data to display

## 2021-11-12 NOTE — Consult Note (Signed)
Consultation Note Date: 11/12/2021   Patient Name: David Scott  DOB: [birth year]  MRN: [id number]  Age / Sex: 81 y.o., male  PCP: Elias Else, MD Referring Physician: Albertine Grates, MD  Reason for Consultation: Establishing goals of care  HPI/Patient Profile: 81 y.o. male  with past medical history of stroke, htn, COPD, severe protein calorie malnutrition, and failure to thrive  admitted on 10/30/2021 with worsening sacral decubitus ulcer.  Patient was started on empiric antibiotics for sacral wound.  Surgery consulted but no need for surgery at this time.  Patient with intermittent refusal of medications and wound care.  PMT consulted for goals of care.  Clinical Assessment and Goals of Care: I have reviewed medical records including EPIC notes, labs and imaging, received report from RN, assessed the patient and then spoke with patient's son and Dennard Nip to discuss diagnosis prognosis, GOC, EOL wishes, disposition and options.  I introduced Palliative Medicine as specialized medical care for people living with serious illness. It focuses on providing relief from the symptoms and stress of a serious illness. The goal is to improve quality of life for both the patient and the family.  [name] shares that following patient's last admission he brought patient home and attempted to care for him at home but patient suffered another fall and so was admitted to rehab facility.  Noreene Larsson shares he has been participating in rehab and was initially doing well but feels like he has plateaued.   We discussed patient's current illness and what it means in the larger context of patient's on-going co-morbidities.  We discussed patient's intermittent refusal of medical interventions.  We discussed that sometimes he eats quite well and other times he refuses all p.o. intake.  [name] reports this is consistent with prior hospitalizations and he feels as though will get  better.  I attempted to elicit values and goals of care important to the patient.  Chills interested in patient going back to rehab and to continue to pursue further rehabilitation.  Palliative Care services outpatient were explained and offered.  Noreene Larsson is agreeable to outpatient palliative follow-up.  Questions and concerns were addressed. The family was encouraged to call with questions or concerns.  Primary Decision Maker HCPOA -son [name]    SUMMARY OF RECOMMENDATIONS   Family interested in readmit to rehab facility Family agrees to outpatient palliative follow-up  Code Status/Advance Care Planning: DNR   Discharge Planning: Skilled Nursing Facility for rehab with Palliative care service follow-up      Primary Diagnoses: Present on Admission:  Back abscess  Memory loss  Supranuclear palsy [facility])  Adult failure to thrive syndrome   I have reviewed the medical record, interviewed the patient and family, and examined the patient. The following aspects are pertinent.  Past Medical History:  Diagnosis Date   Aortic atherosclerosis (HCC) 06/11/2021   Bradycardia 06/11/2021   CVA (cerebral vascular accident) [facility])    Esophageal reflux    Leg fracture    Lethargy 09/30/2021   Shortness of breath 03/03/2021   Skin lesion 03/03/2021   Social History   Socioeconomic History   Marital status: Single    Spouse name: Not on file   Number of children: Not on file   Years of education: Not on file   Highest education level: Not on file  Occupational History   Not on file  Tobacco Use   Smoking status: Never   Smokeless tobacco: Never  Vaping Use   Vaping Use: Never used  Substance and Sexual Activity   Alcohol use: No   Drug use: No   Sexual activity: Not on file  Other Topics Concern   Not on file  Social History Narrative   Not on file   Social Determinants of Health   Financial Resource Strain: Not on file  Food Insecurity: Not on file  Transportation Needs: Not  on file  Physical Activity: Not on file  Stress: Not on file  Social Connections: Not on file   Family History  Problem Relation Age of Onset   COPD Father    Scheduled Meds:  vitamin C  1,000 mg Oral Daily   aspirin  81 mg Oral QPC lunch   collagenase   Topical BID   feeding supplement  237 mL Oral TID BM   heparin  5,000 Units Subcutaneous Q8H   magnesium oxide  200 mg Oral Daily   mirtazapine  15 mg Oral QHS   multivitamin with minerals  1 tablet Oral Daily   pantoprazole  40 mg Oral Daily   polyvinyl alcohol  1 drop Both Eyes BID   thiamine  100 mg Oral Daily   Continuous Infusions:  ceFEPime (MAXIPIME) IV 2 g (11/12/21 1130)   metronidazole 500 mg (11/12/21 0916)   vancomycin 1,000 mg (11/12/21 0020)   PRN Meds:.acetaminophen, albuterol, melatonin Allergies  Allergen Reactions   Benzonatate     Other reaction(s): vivid dreams   Tamsulosin Hcl     Other reaction(s): dizzy/achy   Septra [Sulfamethoxazole-Trimethoprim] Rash    Pt states MD had him taking Sulfa by the handful when he had prostate infection, says they don't know what caused the rash   Review of Systems  Unable to perform ROS: Mental status change    Physical Exam Constitutional:      General: He is not in acute distress.    Appearance: He is ill-appearing.     Comments: Opens eyes to voice but noncommunicative  Pulmonary:     Effort: Pulmonary effort is normal.  Skin:    General: Skin is warm and dry.     Vital Signs: BP (!) 123/56 (BP Location: Left Arm)   Pulse 60   Temp 98.6 F (37 C) (Oral)   Resp 16   Ht 5\' 10"  (1.778 m)   Wt 53 kg   SpO2 97%   BMI 16.77 kg/m  Pain Scale: Faces       SpO2: SpO2: 97 % O2 Device:SpO2: 97 % O2 Flow Rate: .   IO: Intake/output summary:  Intake/Output Summary (Last 24 hours) at 11/12/2021 1154 Last data filed at 11/12/2021 1130 Gross per 24 hour  Intake 883 ml  Output 1900 ml  Net -1017 ml    LBM: Last BM Date :  (UTA) Baseline Weight:  Weight: 53 kg Most recent weight: Weight: 53 kg     Palliative Assessment/Data: PPS 30%     *Please note that this is a verbal dictation therefore any spelling or grammatical errors are due to the "Dragon [employer] One" system interpretation.   11/14/2021, DNP, AGNP-C Palliative Medicine Team [phone] Pager: [phone]

## 2021-11-12 NOTE — Progress Notes (Signed)
Pt refused to take anything. Pt is very lethargic.

## 2021-11-12 NOTE — Progress Notes (Signed)
PROGRESS NOTE    David Scott  [id number] DOB: 01/23/1941 DOA: 11/07/2021 PCP: Maury Dus, MD    Brief Narrative:  KOLETON Scott is a 81 y.o. male with history of stroke, severe protein calorie malnutrition, failure to thrive who has been bedbound since his last fall in May was noticed to have worsening sacral decubitus ulcer as seen in a skilled nursing facility.    Per RN, patient appears to intermittently refuse  blood draws, medications, and wound care. Palliative care consulted   Assessment and Plan:  Stage IV sacral cubitus ulcer with abscess for which patient was started on empiric antibiotics does not appear to be septic at this time.  -Reportedly has been bed to wheelchair ridden for about 59months -continue abx  -seen by general surgery , no plan for surgery -wound care -palliative care   Prior history of stroke/  Minimally communicative with chronic encephalopathy as per the daughter patient is at baseline  Per daughter patient has been  bed to wheelchair ridden , total care for about 10months, needs to be fed -poor overall prognosis   Severe protein calorie malnutrition /underweight Body mass index is 16.77 kg/m. Physical exam revealed significant fat depletion, muscle atrophy  -severe malnutrition has been documented in 08/2021 as well, appear was admitted to the hospital with dehydration /FTT in 08/2021 -this will interfere with his ability to heal -per daughter patient eats well, however, he is severely malnourished on exam  Elevated LFTs  -possibly due to underline infection ,  ab us/ ammonia level /ck  noncontributory -lft improved    DVT prophylaxis: enoxaparin (LOVENOX) injection 40 mg Start: 11/12/21 2200    Code Status: DNR Family Communication: son over the phone  Disposition Plan:  Level of care: Med-Surg Status is: Inpatient Remains inpatient appropriate because: needs goals of care discussion     Consultants:  General  surgery Palliative care   Subjective: Appear more awake, does attempt to follow command, though very weak  Objective: Vitals:   11/11/21 1359 11/11/21 2025 11/12/21 0448 11/12/21 1412  BP: 101/81 137/72 (!) 123/56 (!) 90/56  Pulse: 62 61 60 63  Resp: 18 16 16 18   Temp: 98.4 F (36.9 C) 98.7 F (37.1 C) 98.6 F (37 C) 98.5 F (36.9 C)  TempSrc: Oral Oral Oral Oral  SpO2: 100% 99% 97% 97%  Weight:      Height:        Intake/Output Summary (Last 24 hours) at 11/12/2021 1742 Last data filed at 11/12/2021 1700 Gross per 24 hour  Intake 989.93 ml  Output 1800 ml  Net -810.07 ml   Filed Weights   11/06/2021 1519  Weight: 53 kg    Examination:   General: Appearance:    Frail male , nonverbal, temporal wasting, significant muscle wasting , fat depletion      Lungs:     respirations unlabored  Heart:    Normal heart rate.    MS:   Muscle wasting, left hemiparesis, left hand 4digits, bilateral legs in pressor off loading boots   Neurologic:   lethargic. Non-verbal       Data Reviewed: I have personally reviewed following labs and imaging studies  CBC: Recent Labs  Lab 10/28/2021 1537 11/10/21 0650 11/12/21 0444  WBC 13.2* 13.6* 12.7*  NEUTROABS 11.6*  --  11.2*  HGB 11.1* 11.4* 10.9*  HCT 34.6* 35.1* 34.2*  MCV 98.0 98.6 98.3  PLT 479* 444* [id number]*   Basic Metabolic Panel: Recent Labs  Lab 12/07/21 1537 11/10/21 0650 11/12/21 0444  NA 136 135 134*  K 4.2 4.0 3.9  CL 101 100 101  CO2 28 28 26   GLUCOSE 151* 129* 107*  BUN 24* 24* 17  CREATININE 0.76 0.68 0.64  CALCIUM 8.0* 8.5* 8.3*  MG  --   --  1.8  PHOS  --   --  3.0   GFR: Estimated Creatinine Clearance: 57 mL/min (by C-G formula based on SCr of 0.64 mg/dL). Liver Function Tests: Recent Labs  Lab 12/07/21 1537 11/12/21 0444  AST 54* 21  ALT 69* 33  ALKPHOS 93 80  BILITOT 0.4 0.6  PROT 6.1* 5.4*  ALBUMIN 2.2* 2.0*   No results for input(s): "LIPASE", "AMYLASE" in the last 168  hours. Recent Labs  Lab 11/12/21 0444  AMMONIA 18   Coagulation Profile: No results for input(s): "INR", "PROTIME" in the last 168 hours. Cardiac Enzymes: Recent Labs  Lab 11/12/21 0444  CKTOTAL 14*   BNP (last 3 results) No results for input(s): "PROBNP" in the last 8760 hours. HbA1C: No results for input(s): "HGBA1C" in the last 72 hours. CBG: Recent Labs  Lab 11/11/21 1151 11/11/21 1746 11/12/21 0017 11/12/21 0530 11/12/21 1119  GLUCAP 138* 118* 103* 112* 133*   Lipid Profile: No results for input(s): "CHOL", "HDL", "LDLCALC", "TRIG", "CHOLHDL", "LDLDIRECT" in the last 72 hours. Thyroid Function Tests: No results for input(s): "TSH", "T4TOTAL", "FREET4", "T3FREE", "THYROIDAB" in the last 72 hours. Anemia Panel: No results for input(s): "VITAMINB12", "FOLATE", "FERRITIN", "TIBC", "IRON", "RETICCTPCT" in the last 72 hours. Sepsis Labs: No results for input(s): "PROCALCITON", "LATICACIDVEN" in the last 168 hours.  Recent Results (from the past 240 hour(s))  Blood culture (routine x 2)     Status: None (Preliminary result)   Collection Time: 2021-12-07  6:40 PM   Specimen: BLOOD  Result Value Ref Range Status   Specimen Description   Final    BLOOD BLOOD RIGHT WRIST Performed at [facility], [address] [place], [place]    Special Requests   Final    BOTTLES DRAWN AEROBIC AND ANAEROBIC Blood Culture adequate volume Performed at [facility], [address], [place], [place]    Culture   Final    NO GROWTH 3 DAYS Performed at [facility], [address], [place], [place]    Report Status PENDING  Incomplete  Blood culture (routine x 2)     Status: None (Preliminary result)   Collection Time: 11/10/21  6:50 AM   Specimen: BLOOD  Result Value Ref Range Status   Specimen Description   Final    BLOOD LEFT ANTECUBITAL Performed at [facility], [address],  [place], [place]    Special Requests   Final    BOTTLES DRAWN AEROBIC AND ANAEROBIC Blood Culture adequate volume Performed at [facility], [address], [place], [place]    Culture   Final    NO GROWTH 2 DAYS Performed at [facility], [address] [place], [place]    Report Status PENDING  Incomplete         Radiology Studies: [id number] Abdomen Limited  Result Date: 11/12/2021 CLINICAL DATA:  11/14/2021 elevated LFTs EXAM: ULTRASOUND ABDOMEN LIMITED RIGHT UPPER QUADRANT COMPARISON:  Correlation is made with a CT of the abdomen pelvis dated August 22, 2021 FINDINGS: Per the technologist's note, study was difficult due to patient  breathing, unable to hold the breath and overlying bowel-gas Gallbladder: No gallstones or wall thickening visualized. There is 1 cm echogenic area seen at the neck of the gallbladder without posterior acoustic shadowing. Gallbladder wall measures 2 mm. No pericholecystic fluid. No sonographic Murphy sign noted by sonographer. Common bile duct: Diameter: 3 mm Liver: No focal lesion identified. Within normal limits in parenchymal echogenicity. Portal vein is patent on color Doppler imaging with normal direction of blood flow towards the liver. Other: None. IMPRESSION: 1. No cholecystitis or cholelithiasis. 1 cm predominantly hyperechogenic structure at the neck of the gallbladder likely a polyp. 2.  CBD is within normal limits. 3.  Hepatic parenchyma has a normal echogenicity. Electronically Signed   By: Marjo Bicker M.D.   On: 11/12/2021 09:05        Scheduled Meds:  vitamin C  1,000 mg Oral Daily   aspirin  81 mg Oral QPC lunch   collagenase   Topical BID   enoxaparin (LOVENOX) injection  40 mg Subcutaneous Q24H   feeding supplement  237 mL Oral TID BM   magnesium oxide  200 mg Oral Daily   mirtazapine  15 mg Oral QHS   multivitamin with minerals  1 tablet Oral Daily   pantoprazole  40 mg Oral Daily    polyethylene glycol  17 g Oral Daily   polyvinyl alcohol  1 drop Both Eyes BID   thiamine  100 mg Oral Daily   Continuous Infusions:  sodium chloride     ceFEPime (MAXIPIME) IV 2 g (11/12/21 1130)   magnesium sulfate bolus IVPB     metronidazole 500 mg (11/12/21 0916)   vancomycin 1,000 mg (11/12/21 0020)     LOS: 3 days       Albertine Grates, MD PhD FACP [facility] Available via Epic secure chat 7am-7pm After these hours, please refer to coverage provider listed on [email or web address] 11/12/2021, 5:42 PM

## 2021-11-12 NOTE — Evaluation (Signed)
Clinical/Bedside Swallow Evaluation Patient Details  Name: DMARION PERFECT MRN: [id number] Date of Birth: 1940/10/15  Today's Date: 11/12/2021 Time: SLP Start Time (ACUTE ONLY): 1347 SLP Stop Time (ACUTE ONLY): 1410 SLP Time Calculation (min) (ACUTE ONLY): 23 min  Past Medical History:  Past Medical History:  Diagnosis Date   Aortic atherosclerosis (HCC) 06/11/2021   Bradycardia 06/11/2021   CVA (cerebral vascular accident) [facility])    Esophageal reflux    Leg fracture    Lethargy 09/30/2021   Shortness of breath 03/03/2021   Skin lesion 03/03/2021   Past Surgical History: History reviewed. No pertinent surgical history. HPI:  Pt is 81 yo male adm to [facility] with cellulitis.  Has h/o CVA, FTT, malnutrition,  Cellulitis, wound care, 08/25/2021 CXR negative, no CXR now, lethargic, supranuclear palsy, s/p palliative - full tx , Patchy T2 hyperintensity in the supratentorial white matter is nonspecific but may reflect mild chronic microvascular ischemic changes. Left anterior inferior frontal encephalomalacia. Small chronic right cerebellar infarcts. Punctate focus susceptibility in the left parietal white matter likely reflects chronic microhemorrhage., 50-75% po.  He is on a regular/thin diet - palliative referral completed with note indicating son - desired aggressive treatment/rehab.  Swallow eval ordered.    Assessment / Plan / Recommendation  Clinical Impression  Patient is presenting with clinical indications of oropharyngeal dysphagia suspected due to his progressive supranuclear palsy.  Generalized weakness including severe dysarthria, weak phonation, and impaired labial seal noted likley impacting oral transiting/coordination with liquids more than solids.   Pt unable to form adequate seal on a straw - to form suction for consumption of Ensure due to weakness.  Subtle throat clearing noted across all liquid consistencies *occuring with approximately 75% of bolus regardless of  consistency *nectar va  thin* or administration method *cup, straw, tsp.  Adequate oral clearance and no concerns for pharyngeal deficits with intake of soft solids.  At this timie, recommend continue po diet of soft foods and thin liquids - modifying administration to tsp amounts if prevents/decreases coughing.  Advise if pt has to use tsps of thin with meals, to allow thin water from cup or straw after meals/oral care to help with hydration and comfort.  Per notes, pt has baseline memory deficits - but no family present and uncertain to level of difficulty.  Note palliative referral conducted and RD referral placed. Pt able to help with holding his cup - but requires total assist for meals.  His verbal output was difficult to understand but he did state "cold" and "I don't know about that" regarding hydrotherapy otherwise severe dysarthria and ? word salad output noted  Will follow up briefly to assuer tolerance, assess for indication of instrumental swallow evaluation. SLP Visit Diagnosis: Dysphagia, oropharyngeal phase (R13.12);Dysphagia, unspecified (R13.10)    Aspiration Risk  Mild aspiration risk    Diet Recommendation Regular;Thin liquid (SOFT FOODS)   Medication Administration: Whole meds with liquid Supervision: Full supervision/cueing for compensatory strategies Compensations: Minimize environmental distractions;Slow rate;Small sips/bites Postural Changes: Remain upright for at least 30 minutes after po intake;Seated upright at 90 degrees    Other  Recommendations Oral Care Recommendations: Oral care BID (oral care after meals)    Recommendations for follow up therapy are one component of a multi-disciplinary discharge planning process, led by the attending physician.  Recommendations may be updated based on patient status, additional functional criteria and insurance authorization.  Follow up Recommendations Skilled nursing-short term rehab (<3 hours/day)      Assistance Recommended at Discharge  Frequent or  constant Supervision/Assistance  Functional Status Assessment Patient has had a recent decline in their functional status and/or demonstrates limited ability to make significant improvements in function in a reasonable and predictable amount of time  Frequency and Duration min 1 x/week  2 weeks       Prognosis Prognosis for Safe Diet Advancement: Fair Barriers to Reach Goals: Other (Comment) (progressive neuro diagnosis)      Swallow Study   General Date of Onset: 11/12/21 HPI: Pt is 81 yo male adm to [facility] with cellulitis.  Has h/o CVA, FTT, malnutrition,  Cellulitis, wound care, 08/25/2021 CXR negative, no CXR now, lethargic, supranuclear palsy, s/p palliative - full tx , Patchy T2 hyperintensity in the supratentorial white matter is nonspecific but may reflect mild chronic microvascular ischemic changes. Left anterior inferior frontal encephalomalacia. Small chronic right cerebellar infarcts. Punctate focus susceptibility in the left parietal white matter likely reflects chronic microhemorrhage., 50-75% po.  He is on a regular/thin diet - palliative referral completed with note indicating son - desired aggressive treatment/rehab.  Swallow eval ordered. Type of Study: Bedside Swallow Evaluation Previous Swallow Assessment: none in EPIC Diet Prior to this Study: Regular;Thin liquids Temperature Spikes Noted: No Respiratory Status: Room air History of Recent Intubation: No Behavior/Cognition: Alert;Other (Comment);Confused (has h/o memory deficits, unknown to what extent at baseline) Oral Cavity Assessment: Within Functional Limits Oral Care Completed by SLP: No (pt eating) Oral Cavity - Dentition: Adequate natural dentition Self-Feeding Abilities: Needs assist Patient Positioning: Upright in bed Baseline Vocal Quality: Low vocal intensity Volitional Cough: Weak Volitional Swallow: Able to elicit (with significant effort)    Oral/Motor/Sensory Function Overall Oral Motor/Sensory Function:  Generalized oral weakness   Ice Chips Ice chips: Not tested   Thin Liquid Thin Liquid: Impaired Presentation: Cup;Straw;Spoon;Self Fed Oral Phase Impairments: Reduced labial seal;Reduced lingual movement/coordination (clinical appearance of) Pharyngeal  Phase Impairments: Suspected delayed Swallow;Throat Clearing - Immediate;Throat Clearing - Delayed;Cough - Delayed    Nectar Thick Nectar Thick Liquid: Impaired Presentation: Cup;Self Fed;Spoon;Straw Oral Phase Impairments: Poor awareness of bolus;Reduced lingual movement/coordination;Reduced labial seal Oral phase functional implications: Prolonged oral transit (clinical judgement) Pharyngeal Phase Impairments: Suspected delayed Swallow;Throat Clearing - Delayed   Honey Thick Honey Thick Liquid: Not tested   Puree Puree: Not tested   Solid     Solid: Within functional limits Other Comments: soft solids provided including cooked carrots, pot roast- - no indication of difficulties nor oral retention      Chales Abrahams 11/12/2021,2:37 PM Rolena Infante, MS [facility] SLP Acute Rehab Services Office [phone] Pager [phone]

## 2021-11-12 NOTE — Progress Notes (Signed)
Pharmacy Antibiotic Note  David Scott is a 81 y.o. male admitted on 11/08/2021 with cellulitis.  PMH significant for sacral wound x 1 week.  Patient received Cefazolin 1gm IV x 1 in the ED.  Pharmacy has been consulted for Vancomycin and Cefepime dosing. MD dosing Flagyl.  Today, Day #3 abx - Afebrile - WBC slightly improved 12.7 - SCr stable 0.64 - Blood culture neg to date  Plan: Continue Cefepime 2gm IV q12h Continue Vancomycin 1000 mg IV Q 24 hrs. Goal AUC 400-550.  Expected AUC: 506.4  SCr used: 0.8 (rounded up from 0.76) - will consider checking levels if continues another 48hrs Follow renal function F/u culture results and sensitivities  Height: 5\' 10"  (177.8 cm) Weight: 53 kg (116 lb 13.5 oz) IBW/kg (Calculated) : 73  Temp (24hrs), Avg:98.6 F (37 C), Min:98.4 F (36.9 C), Max:98.7 F (37.1 C)  Recent Labs  Lab 11/10/2021 1537 11/10/21 0650 11/12/21 0444  WBC 13.2* 13.6* 12.7*  CREATININE 0.76 0.68 0.64     Estimated Creatinine Clearance: 57 mL/min (by C-G formula based on SCr of 0.64 mg/dL).    Allergies  Allergen Reactions   Benzonatate     Other reaction(s): vivid dreams   Tamsulosin Hcl     Other reaction(s): dizzy/achy   Septra [Sulfamethoxazole-Trimethoprim] Rash    Pt states MD had him taking Sulfa by the handful when he had prostate infection, says they don't know what caused the rash    Antimicrobials this admission: 7/17 Cefazolin x 1 7/18 Cefepime >>   7/18 Vancomycin >> 7/19 Flagyl >>  Dose adjustments this admission:   Microbiology results: 7/17 BCx:  ngtd   Thank you for allowing pharmacy to be a part of this patient's care.  8/17, PharmD, BCPS Pharmacy: [phone] 11/12/2021 7:57 AM

## 2021-11-12 NOTE — Progress Notes (Signed)
   11/12/21   Hydrotherapy  Evaluation  Subjective Assessment  Subjective moans  Patient and Family Stated Goals unable  Date of Onset  (present on admission)  Prior Treatments dressng at [facility]  Evaluation and Treatment  Evaluation and Treatment Procedures Explained to Patient/Family Yes  Evaluation and Treatment Procedures Patient unable to consent due to mental status  Pressure Injury 11/12/21 Sacrum Medial Stage 3 -  Full thickness tissue loss. Subcutaneous fat may be visible but bone, tendon or muscle are NOT exposed. sacral wound with foul  drainage, painful to touch- PT only  Date First Assessed/Time First Assessed: 11/12/21 1430   Location: Sacrum  Location Orientation: Medial  Staging: Stage 3 -  Full thickness tissue loss. Subcutaneous fat may be visible but bone, tendon or muscle are NOT exposed.  Wound Description (Co...  Wound Image  (refer to CCS note for picture)  Dressing Type Santyl;ABD;Gauze (Comment)  Dressing Clean  Dressing Change Frequency Twice a day  Site / Wound Assessment Clean  % Wound base Red or Granulating 0%  % Wound base Yellow/Fibrinous Exudate 100%  Peri-wound Assessment Erythema (blanchable);Purple  Wound Length (cm) 6 cm  Wound Width (cm) 3 cm  Wound Depth (cm) 1 cm  Wound Surface Area (cm^2) 18 cm^2  Wound Volume (cm^3) 18 cm^3  Tunneling (cm) 12 (at 12:00)  Undermining (cm) 2 (from 3-9:00)  Margins Unattached edges (unapproximated)  Drainage Amount Copious  Drainage Description Odor - foul  Treatment Hydrotherapy (Pulse lavage);Packing (Saline gauze)  Hydrotherapy  Pulsed Lavage with Suction (psi) 4 psi  Pulsed Lavage with Suction - Normal Saline Used  (400, patient unable to tolerate)  Pulsed Lavage Tip Tip with splash shield  Pulsed lavage therapy - wound location sacrum  Wound Therapy - Assess/Plan/Recommendations  Wound Therapy - Clinical Statement Patient initially awake, mostly sleeping . Patient has been in SNF for rehab and comes to  hospital  with malodorous  sacral wound present on admission on Nov 20, 2021. Palliative care is following. Patient is noted to withdraw and squirm when PLS was performed. Limited to 400 ML of saline due to apparant pain. Patient had been premedicated with tylenol.PT will provide trial of Hydrotherapy  2 x's per week on Monday and thursday if patient can tolerate. Anticipate limited benefits of Hydrotherapy in patient with FTT, decrease caloric intake. Note patient to return to SNF, Hydrotherapy not indicated at DC per CCS.  Wound Therapy - Functional Problem List .bed bound  Factors Delaying/Impairing Wound Healing Multiple medical problems;Immobility;Incontinence  Hydrotherapy Plan Debridement;Dressing change;Patient/family education;Pulsatile lavage with suction  Wound Therapy - Frequency  (2 x week)  Wound Therapy - Current Recommendations Case manager/social work  Wound Therapy - Follow Up Recommendations f/u pulsed lavage with suction;f/u selective debridement;dressing changes by RN  Wound Therapy Goals - Improve the function of patient's integumentary system by progressing the wound(s) through the phases of wound healing by:  Decrease Necrotic Tissue to 5o  Decrease Necrotic Tissue - Progress Goal set today  Increase Granulation Tissue to 50  Increase Granulation Tissue - Progress Goal set today  Improve Drainage Characteristics Min  Improve Drainage Characteristics - Progress Goal set today  Goals/treatment plan/discharge plan were made with and agreed upon by patient/family No, Patient unable to participate in goals/treatment/discharge plan and family unavailable  Time For Goal Achievement 2 weeks  Wound Therapy - Potential for Goals Poor  Blanchard Kelch PT Acute Rehabilitation Services Office [phone] Weekend pager-[phone] Ev, deb > 20, gun and tip

## 2021-11-13 DIAGNOSIS — Z515 Encounter for palliative care: Secondary | ICD-10-CM | POA: Diagnosis not present

## 2021-11-13 DIAGNOSIS — Z7189 Other specified counseling: Secondary | ICD-10-CM | POA: Diagnosis not present

## 2021-11-13 DIAGNOSIS — R627 Adult failure to thrive: Secondary | ICD-10-CM | POA: Diagnosis not present

## 2021-11-13 DIAGNOSIS — L89150 Pressure ulcer of sacral region, unstageable: Secondary | ICD-10-CM | POA: Diagnosis not present

## 2021-11-13 DIAGNOSIS — E43 Unspecified severe protein-calorie malnutrition: Secondary | ICD-10-CM | POA: Insufficient documentation

## 2021-11-13 LAB — GLUCOSE, CAPILLARY
Glucose-Capillary: 139 mg/dL — ABNORMAL HIGH (ref 70–99)
Glucose-Capillary: 100 mg/dL — ABNORMAL HIGH (ref 70–99)
Glucose-Capillary: 117 mg/dL — ABNORMAL HIGH (ref 70–99)
Glucose-Capillary: 100 mg/dL — ABNORMAL HIGH (ref 70–99)

## 2021-11-13 MED ORDER — JUVEN PO PACK
1.0000 | PACK | Freq: Two times a day (BID) | ORAL | Status: DC
  Administered 2021-11-13 – 2021-11-15 (×3): 1 via ORAL
  Filled 2021-11-13 (×3): qty 1

## 2021-11-13 NOTE — Progress Notes (Signed)
Speech Language Pathology Treatment: Dysphagia  Patient Details Name: David Scott MRN: [id number] DOB: Nov 28, 1940 Today's Date: 11/13/2021 Time: 9323-5573 SLP Time Calculation (min) (ACUTE ONLY): 30 min  Assessment / Plan / Recommendation Clinical Impression  Pt seen today for skilled ST treatment to address dysphagia.  He was greeted reclined in bed.  He is unable to feed himself, thus assisted him with his lunch meal -He consumed macaroni and cheese and water.  Prolonged oral transiting/mastication noted consistent with findings of supranuclear palsy.  Per research study" Videofluorographic findings included predominant oral phase impairments, including back and forth rocking motion of the tongue, delayed initiation of the pharyngeal swallow, and oral residue."  Pt requires extra time to eat which will increase his risk of nutrition/hydration deficits and aspiration.   Prolonged mastication observed with delay but adequate oral clearance noted.  Recommend continue diet - ordering SOFT foods with strict precautions.  If pt can not have HOB elevated fully - recommend he tilt his head forward for improved oral control.  He would benefit from consideration for RMST at next venue of care to improve phonatory strength.  No acute SlP follow up indicated.  Thanks for this consult.    HPI HPI: Pt is 81 yo male adm to [facility] with cellulitis.  Has h/o CVA, FTT, malnutrition,  Cellulitis, wound care, 08/25/2021 CXR negative, no CXR now, lethargic, supranuclear palsy, s/p palliative - full tx , Patchy T2 hyperintensity in the supratentorial white matter is nonspecific but may reflect mild chronic microvascular ischemic changes. Left anterior inferior frontal encephalomalacia. Small chronic right cerebellar infarcts. Punctate focus susceptibility in the left parietal white matter likely reflects chronic microhemorrhage., 50-75% po.  He is on a regular/thin diet - palliative referral completed with note indicating son  - desired aggressive treatment/rehab.  Swallow eval ordered.      SLP Plan  All goals met      Recommendations for follow up therapy are one component of a multi-disciplinary discharge planning process, led by the attending physician.  Recommendations may be updated based on patient status, additional functional criteria and insurance authorization.    Recommendations  Diet recommendations: Dysphagia 3 (mechanical soft);Thin liquid;Regular Liquids provided via: Cup;Straw Medication Administration: Whole meds with liquid Supervision: Patient able to self feed Compensations: Minimize environmental distractions;Slow rate;Small sips/bites Postural Changes and/or Swallow Maneuvers: Seated upright 90 degrees;Upright 30-60 min after meal                Oral Care Recommendations: Oral care BID (oral care after meals) Follow Up Recommendations: Skilled nursing-short term rehab (<3 hours/day) Assistance recommended at discharge: Frequent or constant Supervision/Assistance SLP Visit Diagnosis: Dysphagia, oropharyngeal phase (R13.12);Dysphagia, unspecified (R13.10) Plan: All goals met         Kathleen Lime, MS [facility] SLP Acute Rehab Services Office [phone] Pager [phone]   Macario Golds  11/13/2021, 1:03 PM

## 2021-11-13 NOTE — Progress Notes (Signed)
Initial Nutrition Assessment  DOCUMENTATION CODES:   Severe malnutrition in context of chronic illness, Underweight  INTERVENTION:   -Ensure Plus High Protein po BID, each supplement provides 350 kcal and 20 grams of protein.   -1 packet Juven BID, each packet provides 95 calories, 2.5 grams of protein (collagen), and 9.8 grams of carbohydrate (3 grams sugar); also contains 7 grams of L-arginine and L-glutamine, 300 mg vitamin C, 15 mg vitamin E, 1.2 mcg vitamin B-12, 9.5 mg zinc, 200 mg calcium, and 1.5 g  Calcium Beta-hydroxy-Beta-methylbutyrate to support wound healing  -48 hour Calorie Count   NUTRITION DIAGNOSIS:   Severe Malnutrition related to chronic illness (supranuclear palsy) as evidenced by severe fat depletion, severe muscle depletion.  GOAL:   Patient will meet greater than or equal to 90% of their needs  MONITOR:   PO intake, Supplement acceptance, Labs, Weight trends, Skin, I & O's  REASON FOR ASSESSMENT:   Consult Assessment of nutrition requirement/status, Calorie Count  ASSESSMENT:   81 y.o. male with history of stroke, severe protein calorie malnutrition, failure to thrive who has been bedbound since his last fall in May was noticed to have worsening sacral decubitus ulcer as seen in a skilled nursing facility  Patient in room, very soft spoken, difficult to understand and pt was mumbling some responses to questions.  Per chart review, pt is bedbound, needs feeding assistance.  SLP evaluated today, recommending dysphagia 3 diet.  Calorie Count-day 1 7/21 Breakfast: 350 kcals, 11g protein 7/20 Lunch: 170 kcals, 10g protein 7/20: Dinner: 100% documented in flowsheets =750 kcals, 40g protein Supplements: 1 Ensure supplements -350 kcals, 20g protein   Total: 1620 kcals (90% of needs), 81g protein (90% of needs)  Will continue Ensure supplements, will add Juven to aid in wound healing.   Medications: Vitamin C, MAG-OX, Remeron, Miralax,  Thiamine  Labs reviewed: CBGs: 100-139 Low Na  NUTRITION - FOCUSED PHYSICAL EXAM:  Flowsheet Row Most Recent Value  Orbital Region Severe depletion  Upper Arm Region Severe depletion  Thoracic and Lumbar Region Severe depletion  Buccal Region Moderate depletion  Temple Region Severe depletion  Clavicle Bone Region Severe depletion  Clavicle and Acromion Bone Region Severe depletion  Scapular Bone Region Severe depletion  Dorsal Hand Severe depletion  [missing thumb on left hand]  Patellar Region Severe depletion  Anterior Thigh Region Severe depletion  Posterior Calf Region Severe depletion  Edema (RD Assessment) None  Hair Reviewed  [thinning]  Eyes Unable to assess  [wouldn't keep eyes open]  Mouth Reviewed  Skin Reviewed  [pale]  Nails Reviewed       Diet Order:   Diet Order             Diet regular Room service appropriate? Yes; Fluid consistency: Thin  Diet effective now                   EDUCATION NEEDS:   Not appropriate for education at this time  Skin:  Skin Assessment: Skin Integrity Issues: Skin Integrity Issues:: Stage III Stage III: sacrum  Last BM:  PTA  Height:   Ht Readings from Last 1 Encounters:  10/29/2021 5\' 10"  (1.778 m)    Weight:   Wt Readings from Last 1 Encounters:  11/08/2021 53 kg    BMI:  Body mass index is 16.77 kg/m.  Estimated Nutritional Needs:   Kcal:  1800-2000  Protein:  90-105g  Fluid:  2L/day  11/11/21, MS, RD, LDN Inpatient Clinical Dietitian Contact  information available via Amion

## 2021-11-13 NOTE — Care Management Important Message (Signed)
Important Message  Patient Details Hospice Name: David [facility] MRN: [id number] Date of Birth: [birth year]   Medicare Important Message Given:  No     Caren Macadam 11/13/2021, 11:41 AM

## 2021-11-13 NOTE — Progress Notes (Addendum)
Subjective: CC: Seen for wound check.   Objective: Vital signs in last 24 hours: Temp:  [97.5 F (36.4 C)-98.5 F (36.9 C)] 97.8 F (36.6 C) (07/21 0432) Pulse Rate:  [56-64] 56 (07/21 0432) Resp:  [16-18] 18 (07/21 0432) BP: (90-146)/(56-96) 146/67 (07/21 0432) SpO2:  [97 %-100 %] 100 % (07/21 0432) Last BM Date :  (UTA)  Intake/Output from previous day: 07/20 0701 - 07/21 0700 In: 1903.8 [P.O.:760; I.V.:743.8; IV Piggyback:400.1] Out: 2250 [Urine:2250] Intake/Output this shift: Total I/O In: 200 [P.O.:200] Out: 500 [Urine:500]  PE: Gen:  Alert, NAD, pleasant GU: Seen with attending. Sacral ulcer as below - wound base with mixed granulation tissue and fibrinous exudate. There does appear to be palpable bone just below this. Wound undermines some superiorly.  There is < 4 cm of surrounding blanching erythema that suspect is likely from pressure. No significant purulent drainage   Lab Results:  Recent Labs    11/12/21 0444  WBC 12.7*  HGB 10.9*  HCT 34.2*  PLT 451*   BMET Recent Labs    11/12/21 0444  NA 134*  K 3.9  CL 101  CO2 26  GLUCOSE 107*  BUN 17  CREATININE 0.64  CALCIUM 8.3*   PT/INR No results for input(s): "LABPROT", "INR" in the last 72 hours. CMP     Component Value Date/Time   NA 134 (L) 11/12/2021 0444   NA 135 (A) 09/04/2021 0000   K 3.9 11/12/2021 0444   CL 101 11/12/2021 0444   CO2 26 11/12/2021 0444   GLUCOSE 107 (H) 11/12/2021 0444   BUN 17 11/12/2021 0444   BUN 29 (A) 09/04/2021 0000   CREATININE 0.64 11/12/2021 0444   CALCIUM 8.3 (L) 11/12/2021 0444   PROT 5.4 (L) 11/12/2021 0444   PROT 6.3 02/03/2017 1037   ALBUMIN 2.0 (L) 11/12/2021 0444   AST 21 11/12/2021 0444   ALT 33 11/12/2021 0444   ALKPHOS 80 11/12/2021 0444   BILITOT 0.6 11/12/2021 0444   GFRNONAA >60 11/12/2021 0444   GFRAA >60 02/28/2018 1759   Lipase     Component Value Date/Time   LIPASE 35 08/22/2021 0805    Studies/Results: US Abdomen  Limited  Result Date: 11/12/2021 CLINICAL DATA:  [id number] elevated LFTs EXAM: ULTRASOUND ABDOMEN LIMITED RIGHT UPPER QUADRANT COMPARISON:  Correlation is made with a CT of the abdomen pelvis dated August 22, 2021 FINDINGS: Per the technologist's note, study was difficult due to patient breathing, unable to hold the breath and overlying bowel-gas Gallbladder: No gallstones or wall thickening visualized. There is 1 cm echogenic area seen at the neck of the gallbladder without posterior acoustic shadowing. Gallbladder wall measures 2 mm. No pericholecystic fluid. No sonographic Murphy sign noted by sonographer. Common bile duct: Diameter: 3 mm Liver: No focal lesion identified. Within normal limits in parenchymal echogenicity. Portal vein is patent on color Doppler imaging with normal direction of blood flow towards the liver. Other: None. IMPRESSION: 1. No cholecystitis or cholelithiasis. 1 cm predominantly hyperechogenic structure at the neck of the gallbladder likely a polyp. 2.  CBD is within normal limits. 3.  Hepatic parenchyma has a normal echogenicity. Electronically Signed   By: Marjo Bicker M.D.   On: 11/12/2021 09:05    Anti-infectives: Anti-infectives (From admission, onward)    Start     Dose/Rate Route Frequency Ordered Stop   11/10/21 2359  vancomycin (VANCOCIN) IVPB 1000 mg/200 mL premix        1,000  mg 200 mL/hr over 60 Minutes Intravenous Every 24 hours 11/11/2021 2339     11/10/21 1200  ceFEPIme (MAXIPIME) 2 g in sodium chloride 0.9 % 100 mL IVPB        2 g 200 mL/hr over 30 Minutes Intravenous Every 12 hours 11/01/2021 2339     11/10/21 1030  metroNIDAZOLE (FLAGYL) IVPB 500 mg        500 mg 100 mL/hr over 60 Minutes Intravenous Every 12 hours 11/10/21 0939     11/14/2021 2359  ceFEPIme (MAXIPIME) 2 g in sodium chloride 0.9 % 100 mL IVPB        2 g 200 mL/hr over 30 Minutes Intravenous  Once 10/30/2021 2329 11/10/21 0114   10/26/2021 2330  vancomycin (VANCOCIN) IVPB 1000 mg/200 mL premix         1,000 mg 200 mL/hr over 60 Minutes Intravenous  Once 11/02/2021 2329 11/10/21 0247   11/11/2021 2130  ceFAZolin (ANCEF) IVPB 1 g/50 mL premix        1 g 100 mL/hr over 30 Minutes Intravenous  Once 11/19/2021 2127 10/27/2021 2216        Assessment/Plan Stage IV sacral decubitus ulcer - Wound seen at bedside with my attending. See description of wound above. There is some undermining but no significant drainage at this time. He is afebrile and wbc was improving on last check (no labs today). There is no acute surgical needs at this time. Agree with continuing hydrotherapy, located wound care with santyl + BID wtd dressing changes (taking note to pack any areas of tunneling), and offloading direct pressure over wound when able. CT showed possible osteo, would defer further workup to ID if felt indicated. Abx per primary. General surgery will not follow acutely. Please call back with any questions or concerns.    FEN: Reg ID: Cefepime, Flagyl, Vanc; BCx pending  Foley: external cath VTE: SCD's, SQH   PMH CVA Chronic encephalopathy Failure to thrive - palliative following   LOS: 4 days    Jacinto Halim , [facility] 11/13/2021, 11:38 AM Please see Amion for pager number during day hours 7:00am-4:30pm

## 2021-11-13 NOTE — Progress Notes (Addendum)
PROGRESS NOTE    David Scott  [id number] DOB: 08/16/1940 DOA: 11/06/2021 PCP: Maury Dus, MD    Brief Narrative:  David Scott is a 81 y.o. male with history of stroke, severe protein calorie malnutrition, failure to thrive who has been bedbound since his last fall in May was noticed to have worsening sacral decubitus ulcer as seen in a skilled nursing facility.    Per RN, patient appears to intermittently refuse  blood draws, medications, and wound care. Palliative care consulted   Assessment and Plan:  Stage IV sacral cubitus ulcer -Reportedly has been bed to wheelchair ridden /total care for about 33months, also appears very malnourished  -Ct scan showed" Crescent shaped gas and fluid collection extending cranially at least to the level of L4, findings are suspicious for soft tissue abscess., also there is concerns for underlying osteomyelitis " -seen by gen surg x2, per gen surg "Wound examined extensively at the bedside. No purulence or abscess found. No plans for surgical debridement.  I am not sure he could even tolerate general anesthesia for this." - on empiric antibiotics, does not appear to be septic at this time, seems to be more alert --wound care -palliative care   Elevated LFTs  -possibly due to underline soft tissue  infection ,  ab us/ ammonia level /ck  noncontributory -lft improved   Prior history of stroke/  Minimally communicative with chronic encephalopathy as per the daughter   -on further chart review he was evaluated by neurology since 2018 due to gait disturbances and memory issues, it appeared that he was diagnosed with supranuclear palsy in 2020 -poor overall prognosis   Severe protein calorie malnutrition /underweight Body mass index is 16.77 kg/m. Physical exam revealed significant fat depletion, muscle atrophy  -severe malnutrition has been documented in 08/2021 as well, appear was admitted to the hospital with dehydration /FTT in  08/2021, bmi at that time was 17.8 -this will interfere with his ability to heal -per daughter patient eats well, however, he is severely malnourished on exam, per RN here, patient at times eats 75% but refuse to eat at other times   FTT: poor prognosis, continue goals of care discussion   DVT prophylaxis: enoxaparin (LOVENOX) injection 40 mg Start: 11/12/21 2200    Code Status: DNR Family Communication: son over the phone  Disposition Plan:  Level of care: Med-Surg Status is: Inpatient Remains inpatient appropriate because: needs goals of care discussion     Consultants:  General surgery Palliative care   Subjective:  Appear more awake compare to yesterday, trying to talk a few words, but does not appear to be able to finish a sentence  Per RN, He refused wound care and am labs today   Objective: Vitals:   11/12/21 1412 11/12/21 1837 11/12/21 2100 11/13/21 0432  BP: (!) 90/56 137/66 (!) 120/96 (!) 146/67  Pulse: 63 64 (!) 57 (!) 56  Resp: 18 16 17 18   Temp: 98.5 F (36.9 C) 97.6 F (36.4 C) (!) 97.5 F (36.4 C) 97.8 F (36.6 C)  TempSrc: Oral Oral Oral Oral  SpO2: 97% 100% 99% 100%  Weight:      Height:        Intake/Output Summary (Last 24 hours) at 11/13/2021 0829 Last data filed at 11/13/2021 0600 Gross per 24 hour  Intake 1903.84 ml  Output 2250 ml  Net -346.16 ml   Filed Weights   11/14/2021 1519  Weight: 53 kg    Examination:   General:  Appearance:    Frail male , trying to talk a few words but not able to finish a sentence , temporal wasting, significant muscle wasting , fat depletion      Lungs:     respirations unlabored  Heart:    Bradycardic.    MS:   Muscle wasting,  left hand 4digits, bilateral legs in offloading boots, sacral wound   Neurologic:  Overall very lethargic, but appears to attempt to talk a few more words       Data Reviewed: I have personally reviewed following labs and imaging studies  CBC: Recent Labs  Lab  11/26/21 1537 11/10/21 0650 11/12/21 0444  WBC 13.2* 13.6* 12.7*  NEUTROABS 11.6*  --  11.2*  HGB 11.1* 11.4* 10.9*  HCT 34.6* 35.1* 34.2*  MCV 98.0 98.6 98.3  PLT 479* 444* 451*   Basic Metabolic Panel: Recent Labs  Lab Nov 26, 2021 1537 11/10/21 0650 11/12/21 0444  NA 136 135 134*  K 4.2 4.0 3.9  CL 101 100 101  CO2 28 28 26   GLUCOSE 151* 129* 107*  BUN 24* 24* 17  CREATININE 0.76 0.68 0.64  CALCIUM 8.0* 8.5* 8.3*  MG  --   --  1.8  PHOS  --   --  3.0   GFR: Estimated Creatinine Clearance: 57 mL/min (by C-G formula based on SCr of 0.64 mg/dL). Liver Function Tests: Recent Labs  Lab November 26, 2021 1537 11/12/21 0444  AST 54* 21  ALT 69* 33  ALKPHOS 93 80  BILITOT 0.4 0.6  PROT 6.1* 5.4*  ALBUMIN 2.2* 2.0*   No results for input(s): "LIPASE", "AMYLASE" in the last 168 hours. Recent Labs  Lab 11/12/21 0444  AMMONIA 18   Coagulation Profile: No results for input(s): "INR", "PROTIME" in the last 168 hours. Cardiac Enzymes: Recent Labs  Lab 11/12/21 0444  CKTOTAL 14*   BNP (last 3 results) No results for input(s): "PROBNP" in the last 8760 hours. HbA1C: No results for input(s): "HGBA1C" in the last 72 hours. CBG: Recent Labs  Lab 11/12/21 0530 11/12/21 1119 11/12/21 1800 11/13/21 0150 11/13/21 0535  GLUCAP 112* 133* 114* 139* 100*   Lipid Profile: No results for input(s): "CHOL", "HDL", "LDLCALC", "TRIG", "CHOLHDL", "LDLDIRECT" in the last 72 hours. Thyroid Function Tests: No results for input(s): "TSH", "T4TOTAL", "FREET4", "T3FREE", "THYROIDAB" in the last 72 hours. Anemia Panel: No results for input(s): "VITAMINB12", "FOLATE", "FERRITIN", "TIBC", "IRON", "RETICCTPCT" in the last 72 hours. Sepsis Labs: No results for input(s): "PROCALCITON", "LATICACIDVEN" in the last 168 hours.  Recent Results (from the past 240 hour(s))  Blood culture (routine x 2)     Status: None (Preliminary result)   Collection Time: November 26, 2021  6:40 PM   Specimen: BLOOD   Result Value Ref Range Status   Specimen Description   Final    BLOOD BLOOD RIGHT WRIST Performed at [facility], [address] [place], [place]    Special Requests   Final    BOTTLES DRAWN AEROBIC AND ANAEROBIC Blood Culture adequate volume Performed at [facility], [address], [place], [place]    Culture   Final    NO GROWTH 4 DAYS Performed at [facility], [address] [place], [place]    Report Status PENDING  Incomplete  Blood culture (routine x 2)     Status: None (Preliminary result)   Collection Time: 11/10/21  6:50 AM   Specimen: BLOOD  Result Value Ref Range  Status   Specimen Description   Final    BLOOD LEFT ANTECUBITAL Performed at [facility], [address], [place], [place] [address]    Special Requests   Final    BOTTLES DRAWN AEROBIC AND ANAEROBIC Blood Culture adequate volume Performed at [facility], [address] [place], [place] [address]    Culture   Final    NO GROWTH 3 DAYS Performed at [facility], [address], [place], [place] [address]    Report Status PENDING  Incomplete  MRSA Next Gen by PCR, Nasal     Status: None   Collection Time: 11/12/21  5:26 PM   Specimen: Nasal Mucosa; Nasal Swab  Result Value Ref Range Status   MRSA by PCR Next Gen NOT DETECTED NOT DETECTED Final    Comment: (NOTE) The GeneXpert MRSA Assay (FDA approved for NASAL specimens only), is one component of a comprehensive MRSA colonization surveillance program. It is not intended to diagnose MRSA infection nor to guide or monitor treatment for MRSA infections. Test performance is not FDA approved in patients less than 55 years old. Performed at [facility], [address], [place], [place] [address]          Radiology Studies: US Abdomen Limited  Result Date: 11/12/2021 CLINICAL DATA:  [id number] elevated LFTs  EXAM: ULTRASOUND ABDOMEN LIMITED RIGHT UPPER QUADRANT COMPARISON:  Correlation is made with a CT of the abdomen pelvis dated August 22, 2021 FINDINGS: Per the technologist's note, study was difficult due to patient breathing, unable to hold the breath and overlying bowel-gas Gallbladder: No gallstones or wall thickening visualized. There is 1 cm echogenic area seen at the neck of the gallbladder without posterior acoustic shadowing. Gallbladder wall measures 2 mm. No pericholecystic fluid. No sonographic Murphy sign noted by sonographer. Common bile duct: Diameter: 3 mm Liver: No focal lesion identified. Within normal limits in parenchymal echogenicity. Portal vein is patent on color Doppler imaging with normal direction of blood flow towards the liver. Other: None. IMPRESSION: 1. No cholecystitis or cholelithiasis. 1 cm predominantly hyperechogenic structure at the neck of the gallbladder likely a polyp. 2.  CBD is within normal limits. 3.  Hepatic parenchyma has a normal echogenicity. Electronically Signed   By: Marjo Bicker M.D.   On: 11/12/2021 09:05        Scheduled Meds:  vitamin C  1,000 mg Oral Daily   aspirin  81 mg Oral QPC lunch   collagenase   Topical BID   enoxaparin (LOVENOX) injection  40 mg Subcutaneous Q24H   feeding supplement  237 mL Oral TID BM   magnesium oxide  200 mg Oral Daily   mirtazapine  15 mg Oral QHS   multivitamin with minerals  1 tablet Oral Daily   pantoprazole  40 mg Oral Daily   polyethylene glycol  17 g Oral Daily   polyvinyl alcohol  1 drop Both Eyes BID   thiamine  100 mg Oral Daily   Continuous Infusions:  sodium chloride 75 mL/hr at 11/12/21 1805   ceFEPime (MAXIPIME) IV 2 g (11/12/21 2314)   metronidazole 500 mg (11/12/21 2136)   vancomycin 1,000 mg (11/12/21 2315)     LOS: 4 days       Albertine Grates, MD PhD FACP [facility] Available via Epic secure chat 7am-7pm After these hours, please refer to coverage provider listed on  [email or web address] 11/13/2021, 8:29 AM

## 2021-11-14 DIAGNOSIS — L02212 Cutaneous abscess of back [any part, except buttock]: Secondary | ICD-10-CM | POA: Diagnosis not present

## 2021-11-14 LAB — GLUCOSE, CAPILLARY
Glucose-Capillary: 113 mg/dL — ABNORMAL HIGH (ref 70–99)
Glucose-Capillary: 122 mg/dL — ABNORMAL HIGH (ref 70–99)
Glucose-Capillary: 133 mg/dL — ABNORMAL HIGH (ref 70–99)
Glucose-Capillary: 98 mg/dL (ref 70–99)

## 2021-11-14 LAB — BASIC METABOLIC PANEL
Anion gap: 5 (ref 5–15)
BUN: 19 mg/dL (ref 8–23)
CO2: 27 mmol/L (ref 22–32)
Calcium: 8.4 mg/dL — ABNORMAL LOW (ref 8.9–10.3)
Chloride: 105 mmol/L (ref 98–111)
Creatinine, Ser: 0.57 mg/dL — ABNORMAL LOW (ref 0.61–1.24)
GFR, Estimated: >60 mL/min (ref >60)
Glucose, Bld: 99 mg/dL (ref 70–99)
Potassium: 4.4 mmol/L (ref 3.5–5.1)
Sodium: 137 mmol/L (ref 135–145)

## 2021-11-14 LAB — CBC WITH DIFFERENTIAL/PLATELET
Abs Immature Granulocytes: 0.04 10*3/uL (ref 0.00–0.07)
Basophils Absolute: 0 10*3/uL (ref 0.0–0.1)
Basophils Relative: 1 %
Eosinophils Absolute: 0.4 10*3/uL (ref 0.0–0.5)
Eosinophils Relative: 6 %
HCT: 34 % — ABNORMAL LOW (ref 39.0–52.0)
Hemoglobin: 11.3 g/dL — ABNORMAL LOW (ref 13.0–17.0)
Immature Granulocytes: 1 %
Lymphocytes Relative: 19 %
Lymphs Abs: 1.1 10*3/uL (ref 0.7–4.0)
MCH: 31.5 pg (ref 26.0–34.0)
MCHC: 33.2 g/dL (ref 30.0–36.0)
MCV: 94.7 fL (ref 80.0–100.0)
Monocytes Absolute: 0.5 10*3/uL (ref 0.1–1.0)
Monocytes Relative: 8 %
Neutro Abs: 3.7 10*3/uL (ref 1.7–7.7)
Neutrophils Relative %: 65 %
Platelets: 464 10*3/uL — ABNORMAL HIGH (ref 150–400)
RBC: 3.59 MIL/uL — ABNORMAL LOW (ref 4.22–5.81)
RDW: 14 % (ref 11.5–15.5)
WBC: 5.8 10*3/uL (ref 4.0–10.5)
nRBC: 0 % (ref 0.0–0.2)

## 2021-11-14 LAB — MAGNESIUM: Magnesium: 2.1 mg/dL (ref 1.7–2.4)

## 2021-11-14 LAB — CULTURE, BLOOD (ROUTINE X 2)
Culture: NO GROWTH
Special Requests: ADEQUATE

## 2021-11-14 MED ORDER — POLYETHYLENE GLYCOL 3350 17 G PO PACK
17.0000 g | PACK | Freq: Two times a day (BID) | ORAL | Status: DC
  Administered 2021-11-14 – 2021-11-15 (×2): 17 g via ORAL
  Filled 2021-11-14 (×2): qty 1

## 2021-11-14 NOTE — Consult Note (Signed)
[facility]       Reason for Consult:  Sacral ulcer   Referring Physician: Dr. Roda Shutters  Principal Problem:   Back abscess Active Problems:   Memory loss   Supranuclear palsy (HCC)   Pressure injury of skin   Inguinal hernia   Adult failure to thrive syndrome   Protein-calorie malnutrition, severe    vitamin C  1,000 mg Oral Daily   aspirin  81 mg Oral QPC lunch   collagenase   Topical BID   enoxaparin (LOVENOX) injection  40 mg Subcutaneous Q24H   feeding supplement  237 mL Oral TID BM   magnesium oxide  200 mg Oral Daily   mirtazapine  15 mg Oral QHS   multivitamin with minerals  1 tablet Oral Daily   nutrition supplement (JUVEN)  1 packet Oral BID BM   pantoprazole  40 mg Oral Daily   polyethylene glycol  17 g Oral Daily   polyvinyl alcohol  1 drop Both Eyes BID   thiamine  100 mg Oral Daily    Recommendations: Stop antibiotics  Assessment: He has a sacral decubitus ulcer that is chronic with no signs of infection clinically with no abscess noted by surgery, no fever and minimal elevation of the WBC.    Pictures of the wound reviewed.  No indication for antibiotic treatment at this time.    Continue wound care and if aggressive healing is the goal, he will need aggressive nutrition support, offloading, diverting colostomy and close wound care management.    Will sign off, call with any questions   HPI: David Scott is a 81 y.o. male with a history of a CVA, severe protein calorie malnutrition, bedbound since May this year sent in by the skilled nursing facility due to a worsening sacral decubitus ulcer.  CT scan was done and noted an area of 'crescent shaped gas and fluid collection' suspicious for soft tissue abscess.  No osseous destructive changes noted.  A swab was done and no growth on culture.  No fever.  WBC of 13.6 on admission.  Seen by surgery and recommended offloading, hydrotherapy and moist to dry dressing changes.  He was  started on broad spectrum antibiotics for the concern for abscess on the CT with vancomycin, cefepime, metronidazole.  Surgery did not find any purulence or abscess and no surgical debridement planned or indicated.    Review of Systems:  Unable to be assessed due to mental status  Past Medical History:  Diagnosis Date   Aortic atherosclerosis (HCC) 06/11/2021   Bradycardia 06/11/2021   CVA (cerebral vascular accident) [facility])    Esophageal reflux    Leg fracture    Lethargy 09/30/2021   Shortness of breath 03/03/2021   Skin lesion 03/03/2021    Social History   Tobacco Use   Smoking status: Never   Smokeless tobacco: Never  Vaping Use   Vaping Use: Never used  Substance Use Topics   Alcohol use: No   Drug use: No    Family History  Problem Relation Age of Onset   COPD Father     Allergies  Allergen Reactions   Benzonatate     Other reaction(s): vivid dreams   Tamsulosin Hcl     Other reaction(s): dizzy/achy   Septra [Sulfamethoxazole-Trimethoprim] Rash    Pt states MD had him taking Sulfa by the handful when he had prostate infection, says they don't know what caused the rash    Physical Exam:  Constitutional: in no apparent distress  Vitals:   11/14/21 0401 11/14/21 1314  BP: (!) 150/78 (!) 142/60  Pulse: 62 71  Resp: 19 16  Temp: 98.2 F (36.8 C) 98 F (36.7 C)  SpO2: 99% 100%   EYES: anicteric Cardiovascular: RRR Respiratory: normal respiratory effort GI: soft  Lab Results  Component Value Date   WBC 5.8 11/14/2021   HGB 11.3 (L) 11/14/2021   HCT 34.0 (L) 11/14/2021   MCV 94.7 11/14/2021   PLT 464 (H) 11/14/2021    Lab Results  Component Value Date   CREATININE 0.57 (L) 11/14/2021   BUN 19 11/14/2021   NA 137 11/14/2021   K 4.4 11/14/2021   CL 105 11/14/2021   CO2 27 11/14/2021    Lab Results  Component Value Date   ALT 33 11/12/2021   AST 21 11/12/2021   ALKPHOS 80 11/12/2021     Microbiology: Recent Results (from the past 240 hour(s))   Blood culture (routine x 2)     Status: None   Collection Time: 11/18/2021  6:40 PM   Specimen: BLOOD  Result Value Ref Range Status   Specimen Description   Final    BLOOD BLOOD RIGHT WRIST Performed at [facility], [address], [place], [place] [address]    Special Requests   Final    BOTTLES DRAWN AEROBIC AND ANAEROBIC Blood Culture adequate volume Performed at [facility], [address], [place], [place] [address]    Culture   Final    NO GROWTH 5 DAYS Performed at [facility], [address], [place], [place] [address]    Report Status 11/14/2021 FINAL  Final  Blood culture (routine x 2)     Status: None (Preliminary result)   Collection Time: 11/10/21  6:50 AM   Specimen: BLOOD  Result Value Ref Range Status   Specimen Description   Final    BLOOD LEFT ANTECUBITAL Performed at [facility], [address] [place], [place] [address]    Special Requests   Final    BOTTLES DRAWN AEROBIC AND ANAEROBIC Blood Culture adequate volume Performed at [facility], [address] [place], [place] [address]    Culture   Final    NO GROWTH 4 DAYS Performed at [facility], [address], [place], [place] [address]    Report Status PENDING  Incomplete  MRSA Next Gen by PCR, Nasal     Status: None   Collection Time: 11/12/21  5:26 PM   Specimen: Nasal Mucosa; Nasal Swab  Result Value Ref Range Status   MRSA by PCR Next Gen NOT DETECTED NOT DETECTED Final    Comment: (NOTE) The GeneXpert MRSA Assay (FDA approved for NASAL specimens only), is one component of a comprehensive MRSA colonization surveillance program. It is not intended to diagnose MRSA infection nor to guide or monitor treatment for MRSA infections. Test performance is not FDA approved in patients less than 75 years old. Performed at [facility], [address] [place], [place] [address]   Aerobic  Culture w Gram Stain (superficial specimen)     Status: None (Preliminary result)   Collection Time: 11/13/21  4:47 PM   Specimen: Wound  Result Value Ref Range Status   Specimen Description WOUND  Final   Special Requests SACRAL  Final   Gram Stain NO WBC SEEN RARE GRAM POSITIVE COCCI IN PAIRS   Final   Culture  Final    NO GROWTH < 24 HOURS Performed at [facility], [address] [place], [place] [address]    Report Status PENDING  Incomplete    Gardiner Barefoot, MD [email or web address] 11/14/2021, 3:14 PM

## 2021-11-14 NOTE — Progress Notes (Addendum)
PROGRESS NOTE    David Scott  [id number] DOB: [birth year] DOA: 10/24/2021 PCP: Maury Dus, MD    Brief Narrative:  David Scott is a 81 y.o. male with history of stroke, severe protein calorie malnutrition, failure to thrive who has been bedbound since his last fall in May was noticed to have worsening sacral decubitus ulcer as seen in a skilled nursing facility.    Per RN, patient appears to intermittently refuse  blood draws, medications, and wound care. Palliative care consulted   Assessment and Plan:  Stage IV sacral cubitus ulcer -Reportedly has been bed to wheelchair ridden /total care for about 40months, also appears very malnourished  -Ct scan showed" Crescent shaped gas and fluid collection extending cranially at least to the level of L4, findings are suspicious for soft tissue abscess., also there is concerns for underlying osteomyelitis " -seen by gen surg x2, per gen surg "Wound examined extensively at the bedside. No purulence or abscess found. No plans for surgical debridement.  I am not sure he could even tolerate general anesthesia for this." - on empiric antibiotics, does not appear to be septic at this time, seems to be more alert -ID consulted, who donot think patient will benefit from further abx treatment --wound care -palliative care   Elevated LFTs  -possibly due to underline soft tissue  infection ,  ab us/ ammonia level /ck  noncontributory -lft improved   Prior history of stroke/  Minimally communicative with chronic encephalopathy as per the daughter  -on further chart review he was evaluated by neurology since 2018 due to gait disturbances and memory issues, it appeared that he was diagnosed with supranuclear palsy in 2020 -poor overall prognosis   Severe protein calorie malnutrition /underweight Body mass index is 16.77 kg/m. Physical exam revealed significant fat depletion, muscle atrophy  -severe malnutrition has been documented in  08/2021 as well, appear was admitted to the hospital with dehydration /FTT in 08/2021, bmi at that time was 17.8 -this will interfere with his ability to heal -per daughter patient eats well, however, he is severely malnourished on exam, per RN here, patient at times eats 75% but refuse to eat at other times - I discussed with son Fara Olden regarding feeding tube, Fara Olden expressed understanding that  feeding tube may not provide benefit in this situation, he remains undecided on this issue.   FTT: poor prognosis, continue goals of care discussion   DVT prophylaxis: enoxaparin (LOVENOX) injection 40 mg Start: 11/12/21 2200    Code Status: DNR Family Communication: son over the phone  Disposition Plan:  Level of care: Med-Surg Status is: Inpatient Remains inpatient appropriate because: return to snf with palliative care    Consultants:  General surgery ID Palliative care   Subjective:  Appear more awake compare to yesterday, trying to talk a few  more words, but still does not appear to be able to finish a sentence  Per RN, He refused to eat breakfast this am  Objective: Vitals:   11/13/21 1511 11/13/21 2015 11/14/21 0401 11/14/21 1314  BP:  (!) 142/98 (!) 150/78 (!) 142/60  Pulse: 63 (!) 56 62 71  Resp:  19 19 16   Temp:  97.7 F (36.5 C) 98.2 F (36.8 C) 98 F (36.7 C)  TempSrc:  Oral Oral Oral  SpO2: 98% 99% 99% 100%  Weight:      Height:        Intake/Output Summary (Last 24 hours) at 11/14/2021 1633 Last data filed at  11/14/2021 1400 Gross per 24 hour  Intake 670 ml  Output 2300 ml  Net -1630 ml   Filed Weights   11/19/2021 1519  Weight: 53 kg    Examination:   General: Appearance:    Frail male , trying to talk a few words but not able to finish a sentence , temporal wasting, significant muscle wasting , fat depletion      Lungs:     respirations unlabored  Heart:    Normal heart rate.    MS:   Muscle wasting,  left hand 4digits, bilateral legs in offloading  boots, sacral wound   Neurologic:  Slightly more awake,  attempt to talk a few more words but still not able to finish a sentence       Data Reviewed: I have personally reviewed following labs and imaging studies  CBC: Recent Labs  Lab 11/16/2021 1537 11/10/21 0650 11/12/21 0444 11/14/21 0528  WBC 13.2* 13.6* 12.7* 5.8  NEUTROABS 11.6*  --  11.2* 3.7  HGB 11.1* 11.4* 10.9* 11.3*  HCT 34.6* 35.1* 34.2* 34.0*  MCV 98.0 98.6 98.3 94.7  PLT 479* 444* 451* 464*   Basic Metabolic Panel: Recent Labs  Lab 10/28/2021 1537 11/10/21 0650 11/12/21 0444 11/14/21 0528  NA 136 135 134* 137  K 4.2 4.0 3.9 4.4  CL 101 100 101 105  CO2 28 28 26 27   GLUCOSE 151* 129* 107* 99  BUN 24* 24* 17 19  CREATININE 0.76 0.68 0.64 0.57*  CALCIUM 8.0* 8.5* 8.3* 8.4*  MG  --   --  1.8 2.1  PHOS  --   --  3.0  --    GFR: Estimated Creatinine Clearance: 57 mL/min (A) (by C-G formula based on SCr of 0.57 mg/dL (L)). Liver Function Tests: Recent Labs  Lab 10/31/2021 1537 11/12/21 0444  AST 54* 21  ALT 69* 33  ALKPHOS 93 80  BILITOT 0.4 0.6  PROT 6.1* 5.4*  ALBUMIN 2.2* 2.0*   No results for input(s): "LIPASE", "AMYLASE" in the last 168 hours. Recent Labs  Lab 11/12/21 0444  AMMONIA 18   Coagulation Profile: No results for input(s): "INR", "PROTIME" in the last 168 hours. Cardiac Enzymes: Recent Labs  Lab 11/12/21 0444  CKTOTAL 14*   BNP (last 3 results) No results for input(s): "PROBNP" in the last 8760 hours. HbA1C: No results for input(s): "HGBA1C" in the last 72 hours. CBG: Recent Labs  Lab 11/13/21 1135 11/13/21 1749 11/14/21 0001 11/14/21 0552 11/14/21 1134  GLUCAP 117* 100* 133* 98 113*   Lipid Profile: No results for input(s): "CHOL", "HDL", "LDLCALC", "TRIG", "CHOLHDL", "LDLDIRECT" in the last 72 hours. Thyroid Function Tests: No results for input(s): "TSH", "T4TOTAL", "FREET4", "T3FREE", "THYROIDAB" in the last 72 hours. Anemia Panel: No results for input(s):  "VITAMINB12", "FOLATE", "FERRITIN", "TIBC", "IRON", "RETICCTPCT" in the last 72 hours. Sepsis Labs: No results for input(s): "PROCALCITON", "LATICACIDVEN" in the last 168 hours.  Recent Results (from the past 240 hour(s))  Blood culture (routine x 2)     Status: None   Collection Time: 11/01/2021  6:40 PM   Specimen: BLOOD  Result Value Ref Range Status   Specimen Description   Final    BLOOD BLOOD RIGHT WRIST Performed at [facility], [address] [place], [place]    Special Requests   Final    BOTTLES DRAWN AEROBIC AND ANAEROBIC Blood Culture adequate volume Performed at [facility], [address] [place], [place]  [address]    Culture   Final    NO GROWTH 5 DAYS Performed at [facility], [address] [place], [place] [address]    Report Status 11/14/2021 FINAL  Final  Blood culture (routine x 2)     Status: None (Preliminary result)   Collection Time: 11/10/21  6:50 AM   Specimen: BLOOD  Result Value Ref Range Status   Specimen Description   Final    BLOOD LEFT ANTECUBITAL Performed at [facility], [address], [place], [place] [address]    Special Requests   Final    BOTTLES DRAWN AEROBIC AND ANAEROBIC Blood Culture adequate volume Performed at [facility], [address], [place], [place] [address]    Culture   Final    NO GROWTH 4 DAYS Performed at [facility], [address], [place], [place] [address]    Report Status PENDING  Incomplete  MRSA Next Gen by PCR, Nasal     Status: None   Collection Time: 11/12/21  5:26 PM   Specimen: Nasal Mucosa; Nasal Swab  Result Value Ref Range Status   MRSA by PCR Next Gen NOT DETECTED NOT DETECTED Final    Comment: (NOTE) The GeneXpert MRSA Assay (FDA approved for NASAL specimens only), is one component of a comprehensive MRSA colonization surveillance program. It is not intended to diagnose MRSA infection nor to  guide or monitor treatment for MRSA infections. Test performance is not FDA approved in patients less than 34 years old. Performed at [facility], [address] [place], [place] [address]   Aerobic Culture w Gram Stain (superficial specimen)     Status: None (Preliminary result)   Collection Time: 11/13/21  4:47 PM   Specimen: Wound  Result Value Ref Range Status   Specimen Description WOUND  Final   Special Requests SACRAL  Final   Gram Stain NO WBC SEEN RARE GRAM POSITIVE COCCI IN PAIRS   Final   Culture   Final    NO GROWTH < 24 HOURS Performed at [facility], [address], [place], [place] [address]    Report Status PENDING  Incomplete         Radiology Studies: No results found.      Scheduled Meds:  vitamin C  1,000 mg Oral Daily   aspirin  81 mg Oral QPC lunch   collagenase   Topical BID   enoxaparin (LOVENOX) injection  40 mg Subcutaneous Q24H   feeding supplement  237 mL Oral TID BM   magnesium oxide  200 mg Oral Daily   mirtazapine  15 mg Oral QHS   multivitamin with minerals  1 tablet Oral Daily   nutrition supplement (JUVEN)  1 packet Oral BID BM   pantoprazole  40 mg Oral Daily   polyethylene glycol  17 g Oral BID   polyvinyl alcohol  1 drop Both Eyes BID   thiamine  100 mg Oral Daily   Continuous Infusions:     LOS: 5 days       Albertine Grates, MD PhD FACP [facility] Available via Epic secure chat 7am-7pm After these hours, please refer to coverage provider listed on [email or web address] 11/14/2021, 4:33 PM

## 2021-11-15 DIAGNOSIS — L02212 Cutaneous abscess of back [any part, except buttock]: Secondary | ICD-10-CM | POA: Diagnosis not present

## 2021-11-15 LAB — CULTURE, BLOOD (ROUTINE X 2)
Culture: NO GROWTH
Special Requests: ADEQUATE

## 2021-11-15 LAB — GLUCOSE, CAPILLARY
Glucose-Capillary: 119 mg/dL — ABNORMAL HIGH (ref 70–99)
Glucose-Capillary: 133 mg/dL — ABNORMAL HIGH (ref 70–99)
Glucose-Capillary: 133 mg/dL — ABNORMAL HIGH (ref 70–99)
Glucose-Capillary: 143 mg/dL — ABNORMAL HIGH (ref 70–99)
Glucose-Capillary: 98 mg/dL (ref 70–99)

## 2021-11-16 LAB — AEROBIC CULTURE W GRAM STAIN (SUPERFICIAL SPECIMEN)
Culture: NORMAL
Gram Stain: NONE SEEN

## 2021-11-24 NOTE — Progress Notes (Signed)
HOSPITAL MEDICINE OVERNIGHT EVENT NOTE    Notified by nursing that patient had a sudden change in respiratory status with some labored breathing and "gurgling."  Nursing rushed to the patient's aide and tried to attempt to suction him but he quickly lost his pulse.  Patient is DNR.  2 RN's pronounced, time of death 9:40PM.  I called and notified the daughter Charlann Noss and the son Ludger Bones.  They will notify nursing shortly whether they plan on coming to the hospital.  Mortality services will additionally be notified.    Marinda Elk  MD [facility]

## 2021-11-24 NOTE — Progress Notes (Signed)
At around 2130 NT went in the room to check V/S and heard that pt is gurgling and has difficulty breathing. Tried to suction Pt but he's refusing, and she called this Clinical research associate to check on pt. Started pt on a non-rebreather (O2)on labored breathing. Pt on respiratory distress and just suddenly went unresponsive.2140 has no RR, No pulse BP not reading. Pronounce death by 2Rns.No family at bedside. Dr Leafy Half notified, and he called daughter and son about this death. Family will call if they're able to come to the the Hospital or not.

## 2021-11-24 NOTE — Progress Notes (Signed)
PROGRESS NOTE    David Scott  FBP:[id number] DOB: Mar 02, 1941 DOA: 11/03/2021 PCP: David Else, MD    Brief Narrative:  David Scott is a 81 y.o. male with history of stroke, severe protein calorie malnutrition, failure to thrive who has been bedbound since his last fall in May was noticed to have worsening sacral decubitus ulcer as seen in a skilled nursing facility.    Per RN, patient appears to intermittently refuse  to eat, refuse blood draws, medications, and wound care. Palliative care consulted   Assessment and Plan:  Stage IV sacral cubitus ulcer -Reportedly has been bed to wheelchair/ bed bound /total care for about 76months, also appears very malnourished  -Ct scan showed" Crescent shaped gas and fluid collection extending cranially at least to the level of L4, findings are suspicious for soft tissue abscess., also there is concerns for underlying osteomyelitis " -seen by gen surg x2, per gen surg "Wound examined extensively at the bedside. No purulence or abscess found. No plans for surgical debridement.  I am not sure he could even tolerate general anesthesia for this." - received empiric antibiotics, does not appear to be septic -ID consulted, who donot think patient will benefit from further abx treatment, abx stopped  --wound care -palliative care   Elevated LFTs  -possibly due to underline soft tissue  infection ,  ab us/ ammonia level /ck  noncontributory -lft improved   Prior history of stroke/  Minimally communicative with chronic encephalopathy as per the daughter  -on further chart review he was evaluated by neurology since 2018 due to gait disturbances and memory issues, it appeared that he was diagnosed with supranuclear palsy in 2020 -total care for about 95months -poor overall prognosis   Severe protein calorie malnutrition /underweight Body mass index is 16.77 kg/m. -Physical exam revealed significant fat depletion, muscle atrophy  -severe  malnutrition has been documented in 08/2021 as well, appear was admitted to the hospital with dehydration /FTT in 08/2021, bmi at that time was 17.8 -this will interfere with his ability to heal -per daughter patient eats well, however, he is severely malnourished on exam, per RN here, patient at times eats 75% but refuse to eat at other times - I discussed with son David Scott regarding feeding tube, David Scott expressed understanding that  feeding tube may not provide benefit in this situation, he remains undecided on this issue.   FTT: poor prognosis, continue goals of care discussion   DVT prophylaxis: enoxaparin (LOVENOX) injection 40 mg Start: 11/12/21 2200    Code Status: DNR Family Communication: son over the phone on 7/20 and 7/22  Disposition Plan:  return to snf with palliative care, at risk of readmission from infections/dehydration, needs continue goals of care I messaged palliative care , hope can have another family meeting on Monday before discharge, outpatient palliative care also engaged     Consultants:  General surgery ID Palliative care   Subjective:   trying to talk a few  more words, but not able to finish a sentence  Per RN, He ate breakfast this am but refused lunch  Objective: Vitals:   11/14/21 2202 12-06-21 0510 12-06-21 1338 December 06, 2021 1341  BP: (!) 147/72 (!) 148/71 (!) 144/59   Pulse: (!) 57 62 (!) 58 73  Resp: 16 16 16    Temp: (!) 97.5 F (36.4 C) (!) 97.4 F (36.3 C) (!) 97.4 F (36.3 C)   TempSrc: Oral Oral Oral   SpO2: 100% 100% (!) 89% 91%  Weight:  Height:        Intake/Output Summary (Last 24 hours) at 12-05-2021 1830 Last data filed at Dec 05, 2021 1402 Gross per 24 hour  Intake 240 ml  Output 1950 ml  Net -1710 ml   Filed Weights   11/01/2021 1519  Weight: 53 kg    Examination:   General: Appearance:    Frail male , trying to talk a few words but not able to finish a sentence , temporal wasting, significant muscle wasting , fat depletion       Lungs:     respirations unlabored  Heart:    Normal heart rate.    MS:   Muscle wasting,  left hand 4digits, bilateral legs in offloading boots, sacral wound   Neurologic:  awake,  attempt to talk a few more words but still not able to finish a sentence       Data Reviewed: I have personally reviewed following labs and imaging studies  CBC: Recent Labs  Lab 10/24/2021 1537 11/10/21 0650 11/12/21 0444 11/14/21 0528  WBC 13.2* 13.6* 12.7* 5.8  NEUTROABS 11.6*  --  11.2* 3.7  HGB 11.1* 11.4* 10.9* 11.3*  HCT 34.6* 35.1* 34.2* 34.0*  MCV 98.0 98.6 98.3 94.7  PLT 479* 444* 451* [id number]*   Basic Metabolic Panel: Recent Labs  Lab 11/18/2021 1537 11/10/21 0650 11/12/21 0444 11/14/21 0528  NA 136 135 134* 137  K 4.2 4.0 3.9 4.4  CL 101 100 101 105  CO2 28 28 26 27   GLUCOSE 151* 129* 107* 99  BUN 24* 24* 17 19  CREATININE 0.76 0.68 0.64 0.57*  CALCIUM 8.0* 8.5* 8.3* 8.4*  MG  --   --  1.8 2.1  PHOS  --   --  3.0  --    GFR: Estimated Creatinine Clearance: 57 mL/min (A) (by C-G formula based on SCr of 0.57 mg/dL (L)). Liver Function Tests: Recent Labs  Lab 10/27/2021 1537 11/12/21 0444  AST 54* 21  ALT 69* 33  ALKPHOS 93 80  BILITOT 0.4 0.6  PROT 6.1* 5.4*  ALBUMIN 2.2* 2.0*   No results for input(s): "LIPASE", "AMYLASE" in the last 168 hours. Recent Labs  Lab 11/12/21 0444  AMMONIA 18   Coagulation Profile: No results for input(s): "INR", "PROTIME" in the last 168 hours. Cardiac Enzymes: Recent Labs  Lab 11/12/21 0444  CKTOTAL 14*   BNP (last 3 results) No results for input(s): "PROBNP" in the last 8760 hours. HbA1C: No results for input(s): "HGBA1C" in the last 72 hours. CBG: Recent Labs  Lab 11/14/21 1134 11/14/21 1859 12/05/2021 0011 12/05/21 0527 12-05-2021 1145  GLUCAP 113* 122* 133* 98 119*   Lipid Profile: No results for input(s): "CHOL", "HDL", "LDLCALC", "TRIG", "CHOLHDL", "LDLDIRECT" in the last 72 hours. Thyroid Function Tests: No  results for input(s): "TSH", "T4TOTAL", "FREET4", "T3FREE", "THYROIDAB" in the last 72 hours. Anemia Panel: No results for input(s): "VITAMINB12", "FOLATE", "FERRITIN", "TIBC", "IRON", "RETICCTPCT" in the last 72 hours. Sepsis Labs: No results for input(s): "PROCALCITON", "LATICACIDVEN" in the last 168 hours.  Recent Results (from the past 240 hour(s))  Blood culture (routine x 2)     Status: None   Collection Time: 10/25/2021  6:40 PM   Specimen: BLOOD  Result Value Ref Range Status   Specimen Description   Final    BLOOD BLOOD RIGHT WRIST Performed at [facility] [address] [place], [place] [address]    Special Requests   Final    BOTTLES DRAWN AEROBIC  AND ANAEROBIC Blood Culture adequate volume Performed at [facility], [address] [place], [place] [address]    Culture   Final    NO GROWTH 5 DAYS Performed at [facility], [address], [place], [place] [address]    Report Status 11/14/2021 FINAL  Final  Blood culture (routine x 2)     Status: None   Collection Time: 11/10/21  6:50 AM   Specimen: BLOOD  Result Value Ref Range Status   Specimen Description   Final    BLOOD LEFT ANTECUBITAL Performed at [facility], [address], [place], [place] [address]    Special Requests   Final    BOTTLES DRAWN AEROBIC AND ANAEROBIC Blood Culture adequate volume Performed at [facility], [address] [place], [place] [address]    Culture   Final    NO GROWTH 5 DAYS Performed at [facility], [address] [place], [place] [address]    Report Status 11/12/2021 FINAL  Final  MRSA Next Gen by PCR, Nasal     Status: None   Collection Time: 11/12/21  5:26 PM   Specimen: Nasal Mucosa; Nasal Swab  Result Value Ref Range Status   MRSA by PCR Next Gen NOT DETECTED NOT DETECTED Final    Comment: (NOTE) The GeneXpert MRSA Assay (FDA approved for NASAL specimens only), is one  component of a comprehensive MRSA colonization surveillance program. It is not intended to diagnose MRSA infection nor to guide or monitor treatment for MRSA infections. Test performance is not FDA approved in patients less than 14 years old. Performed at [facility], [address] [place], [place] [address]   Aerobic Culture w Gram Stain (superficial specimen)     Status: None (Preliminary result)   Collection Time: 11/13/21  4:47 PM   Specimen: Wound  Result Value Ref Range Status   Specimen Description WOUND  Final   Special Requests SACRAL  Final   Gram Stain NO WBC SEEN RARE GRAM POSITIVE COCCI IN PAIRS   Final   Culture   Final    RARE NORMAL SKIN FLORA Performed at [facility], [address] [place], [place] [address]    Report Status PENDING  Incomplete         Radiology Studies: No results found.      Scheduled Meds:  vitamin C  1,000 mg Oral Daily   aspirin  81 mg Oral QPC lunch   collagenase   Topical BID   enoxaparin (LOVENOX) injection  40 mg Subcutaneous Q24H   feeding supplement  237 mL Oral TID BM   magnesium oxide  200 mg Oral Daily   mirtazapine  15 mg Oral QHS   multivitamin with minerals  1 tablet Oral Daily   nutrition supplement (JUVEN)  1 packet Oral BID BM   pantoprazole  40 mg Oral Daily   polyethylene glycol  17 g Oral BID   polyvinyl alcohol  1 drop Both Eyes BID   thiamine  100 mg Oral Daily   Continuous Infusions:     LOS: 6 days       Albertine Grates, MD PhD FACP [facility] Available via Epic secure chat 7am-7pm After these hours, please refer to coverage provider listed on [email or web address] 11/07/2021, 6:30 PM

## 2021-11-24 NOTE — Death Summary Note (Signed)
DEATH SUMMARY   Patient Details  Name: David Scott MRN: [id number] DOB: Dec 14, 1940 WUJ:[id number], Molly Maduro, MD Admission/Discharge Information   Admit Date:  12-01-21  Date of Death: Date of Death: 2021-12-07  Time of Death: Time of Death: Aug 23, 2138  Length of Stay: 6   Principle Cause of death: Hypoxic Respiratory Failure  Hospital Diagnoses: Principal Problem:   Back abscess Active Problems:   Memory loss   Supranuclear palsy (HCC)   Pressure injury of skin   Inguinal hernia   Adult failure to thrive syndrome   Protein-calorie malnutrition, severe   Hospital Course:  81 y.o. male with history of stroke, severe protein calorie malnutrition, failure to thrive who has been bedbound since his last fall in May was noticed to have worsening sacral decubitus ulcer as seen in a skilled nursing facility.  Was therefoer brought to the [facility] emergency department.  In the emergency deparment CT scan showed features concerning for abscess with gas with additional findings concerning for osteomyelitis.   Labs showed leukocytosis. Patient was started on empiric antibiotics admitted for further work-up.    During the hospital stay patient was evaluated by general surgery who examined the wounds extensively at the bedside. While there was no obvious source of infection at the bedside surgery felt that the patient was too chronically ill to tolerate surgical intervention/anesthesia.    Patient was additionally evaluated by Infectious Disease and it was felt that patient would not benefit from further antibiotic treatment.  Antibiotics were stopped.    Overall prognosis was felt to be extremely poor with patient exhibiting a dangerously low BMI of 16.77 and continued physical deterioration.   Speech therapy evaluated the pateint for dysphagia and recommended a dysphagia 3 diet.  The possibility of a feeding tube was discussed with family with them undecided for the course of the  hospitalization.  Consideing the poor prognosis Multiple goals of care conversations were had with the family.  The evening of Dec 08, 2022 at approximately 2128/08/22 nursing rushed into the room when the heard the patient begin to exhibit gurgling and respiratory distress.  NRB O2 oxygen delivery was initiated with nursing immediately calling for assistance , getting vitals and beginning to suction although patient was initially refusing attempts.  Within moments the patient unfortunately became apneic and lost his pulse.  2 RN's pronounced with time of death Aug 23, 2138.  Family was promptly notified.          The results of significant diagnostics from this hospitalization (including imaging, microbiology, ancillary and laboratory) are listed below for reference.   Significant Diagnostic Studies: US Abdomen Limited  Result Date: 11/12/2021 CLINICAL DATA:  [id number] elevated LFTs EXAM: ULTRASOUND ABDOMEN LIMITED RIGHT UPPER QUADRANT COMPARISON:  Correlation is made with a CT of the abdomen pelvis dated August 22, 2021 FINDINGS: Per the technologist's note, study was difficult due to patient breathing, unable to hold the breath and overlying bowel-gas Gallbladder: No gallstones or wall thickening visualized. There is 1 cm echogenic area seen at the neck of the gallbladder without posterior acoustic shadowing. Gallbladder wall measures 2 mm. No pericholecystic fluid. No sonographic Murphy sign noted by sonographer. Common bile duct: Diameter: 3 mm Liver: No focal lesion identified. Within normal limits in parenchymal echogenicity. Portal vein is patent on color Doppler imaging with normal direction of blood flow towards the liver. Other: None. IMPRESSION: 1. No cholecystitis or cholelithiasis. 1 cm predominantly hyperechogenic structure at the neck of the gallbladder likely a polyp. 2.  CBD is  within normal limits. 3.  Hepatic parenchyma has a normal echogenicity. Electronically Signed   By: Marjo Bicker M.D.   On:  11/12/2021 09:05   CT PELVIS WO CONTRAST  Result Date: Nov 11, 2021 CLINICAL DATA:  Wound assess for osteomyelitis EXAM: CT PELVIS WITHOUT CONTRAST TECHNIQUE: Multidetector CT imaging of the pelvis was performed following the standard protocol without intravenous contrast. RADIATION DOSE REDUCTION: This exam was performed according to the departmental dose-optimization program which includes automated exposure control, adjustment of the mA and/or kV according to patient size and/or use of iterative reconstruction technique. COMPARISON:  CT 08/22/2021 FINDINGS: Urinary Tract:  No abnormality visualized. Bowel: Large right inguinal hernia containing colon, mesentery and small bowel but no evidence for obstruction. No bowel wall thickening. Appendix visualized within the hernia sac and is without convincing inflammation. Vascular/Lymphatic: Aortic atherosclerosis. No aneurysm. No suspicious lymph nodes Reproductive:  Enlarged prostate Other:  Negative for pelvic effusion or free air Musculoskeletal: No fracture. No frank osseous destructive change. Sacral decubitus ulcer. Multiple foci of gas within the soft tissues overlying the sacrum, likely related to wound. Cranial extension of fluid and gas collection at least to the level of L4, this measures approximately 8.8 cm transverse by 1.3 cm thick. There is soft tissue stranding in the subcutaneous fat. IMPRESSION: 1. No definite osseous destructive change at the sacrococcygeal region however if osteomyelitis remains a concern, follow-up MRI should be obtained. 2. Sacral decubitus ulcer. Crescent shaped gas and fluid collection extending cranially at least to the level of L4, findings are suspicious for soft tissue abscess. 3. Large right groin hernia without obstruction Electronically Signed   By: Jasmine Pang M.D.   On: 11-Nov-2021 19:40    Microbiology: Recent Results (from the past 240 hour(s))  Aerobic Culture w Gram Stain (superficial specimen)     Status:  None   Collection Time: 11/13/21  4:47 PM   Specimen: Wound  Result Value Ref Range Status   Specimen Description WOUND  Final   Special Requests SACRAL  Final   Gram Stain NO WBC SEEN RARE GRAM POSITIVE COCCI IN PAIRS   Final   Culture   Final    RARE NORMAL SKIN FLORA Performed at [facility], [address], [place], [place] [address]    Report Status 11/16/2021 FINAL  Final    Time spent: 40 minutes  Signed: Marinda Elk, MD 11/14/2021

## 2021-11-24 DEATH — deceased

## 2022-04-05 ENCOUNTER — Ambulatory Visit (HOSPITAL_BASED_OUTPATIENT_CLINIC_OR_DEPARTMENT_OTHER): Payer: Medicare Other | Admitting: Cardiovascular Disease

## 2023-04-24 IMAGING — DX DG CHEST 2V
2 series · 2 of 2 positions shown · non-contrast
Comparison: 11/16/2020.

CLINICAL DATA: Shortness of breath

EXAM:
CHEST - 2 VIEW

[chest lat]
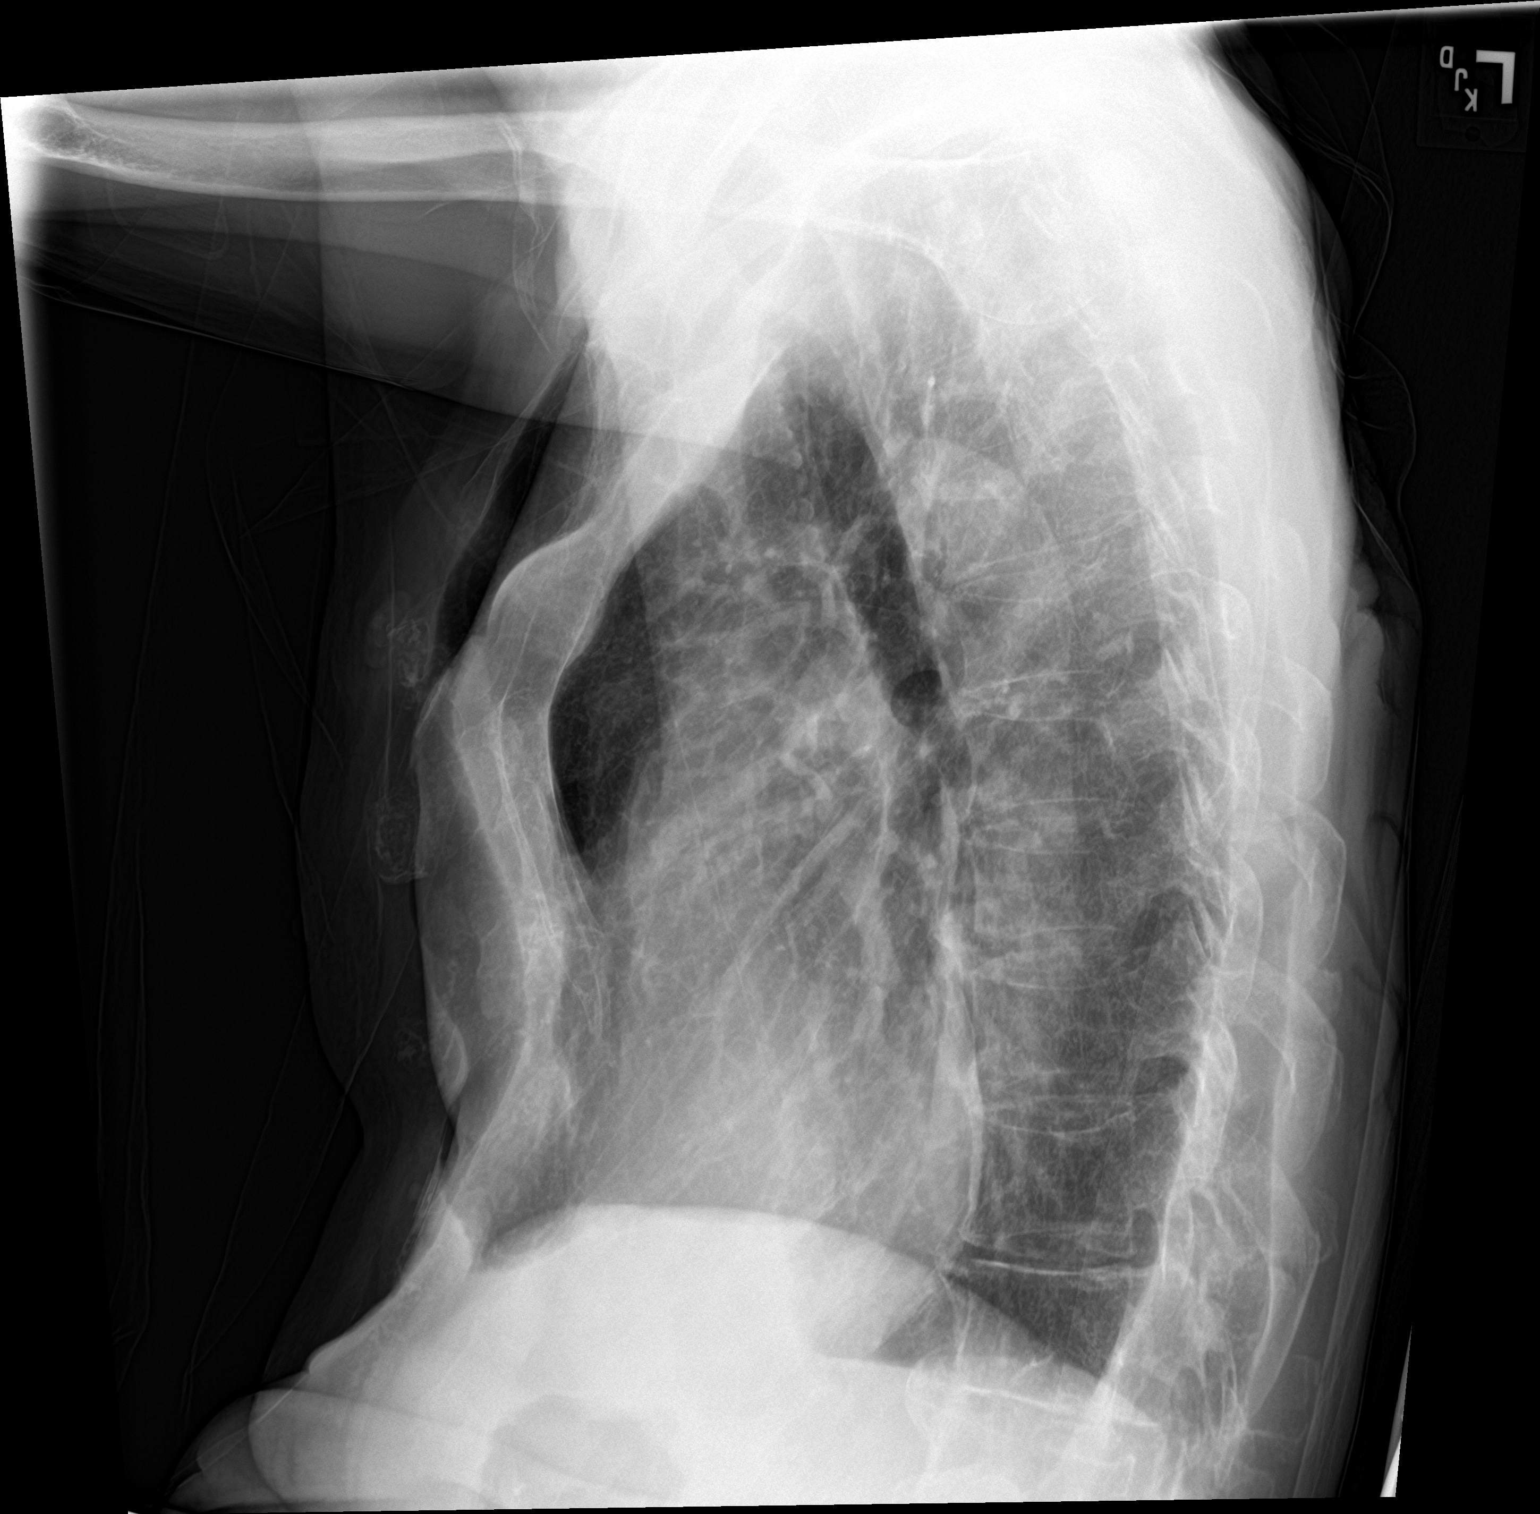

[chest ap]
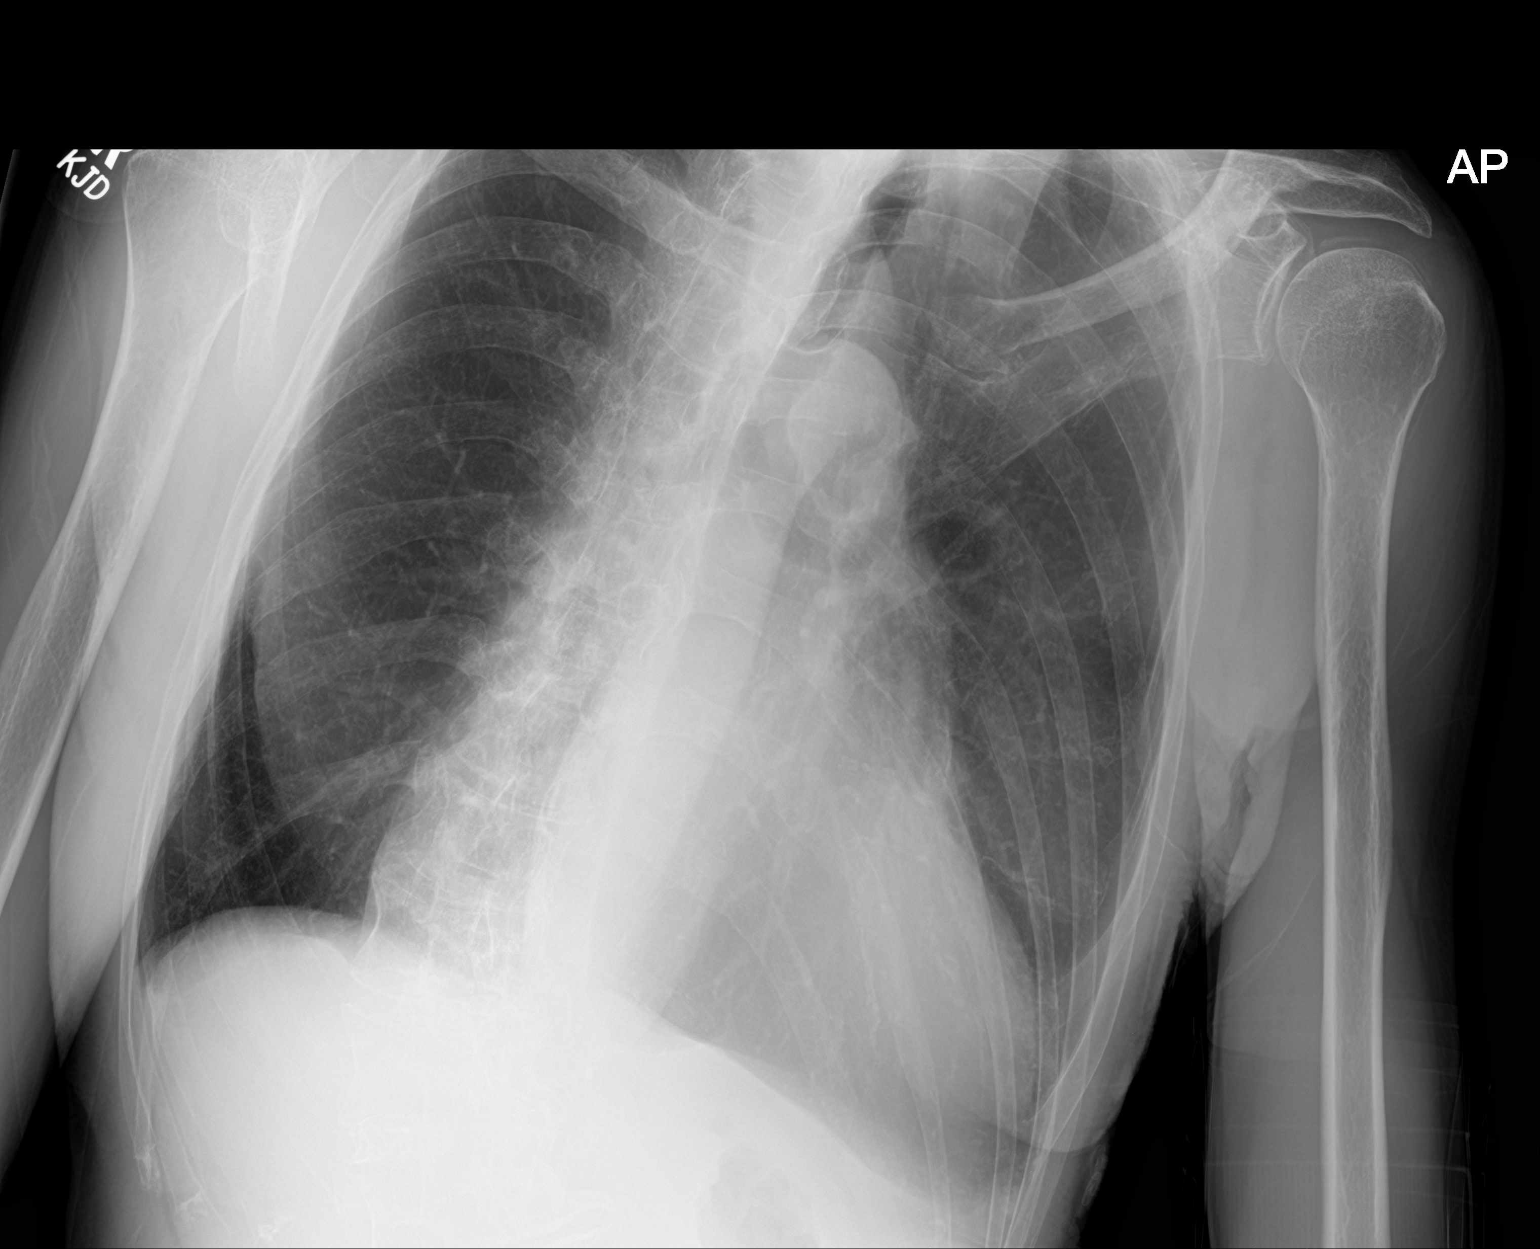

[2 of 2 positions shown; findings below may reference images not displayed]

FINDINGS: Chronic anterior chest wall deformity. Hyperinflation. The Chin
overlies the apices on the frontal radiograph. Patient rotated left.
Mild to moderate cardiomegaly. No pleural effusion or pneumothorax.
No congestive failure. No lobar consolidation.
IMPRESSION: Cardiomegaly, without congestive failure.

Mild position related limitations as detailed above.

## 2023-07-25 IMAGING — CR DG CHEST 2V
2 series · 2 of 2 positions shown · non-contrast
Comparison: Chest radiograph dated 01/03/2021.

CLINICAL DATA: Chest pain.

EXAM:
CHEST - 2 VIEW

[chest lat]
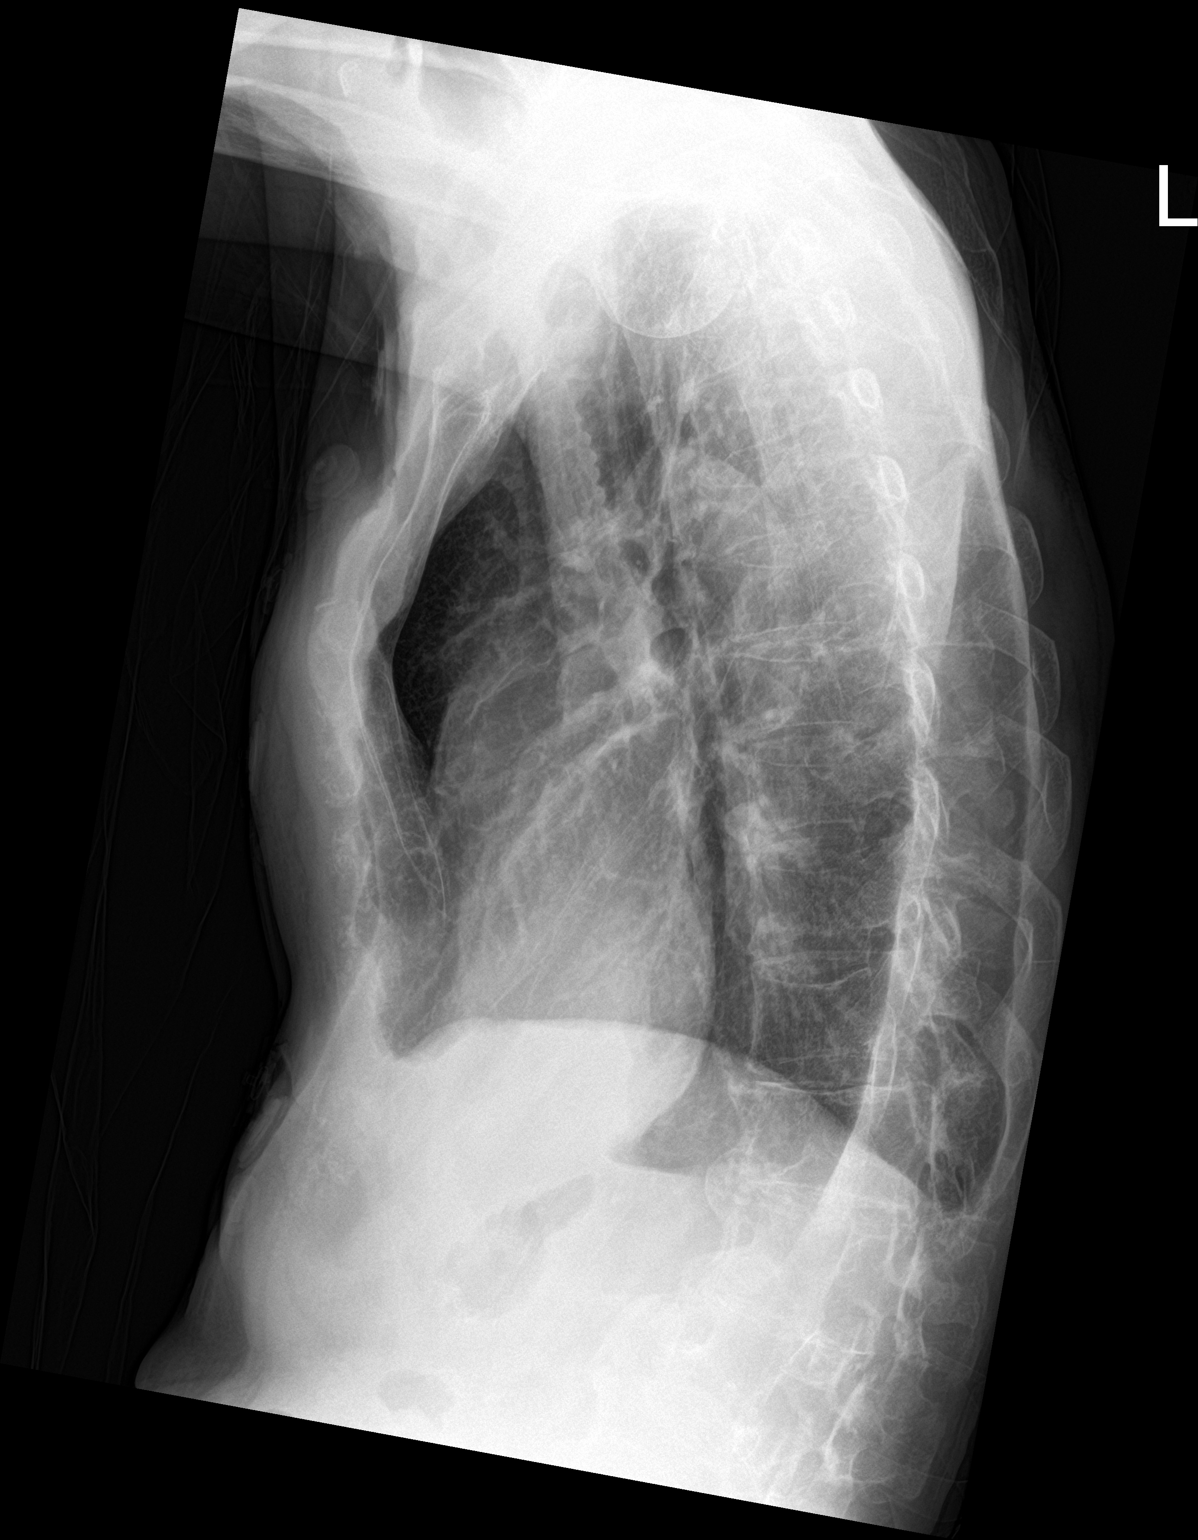

[chest pa]
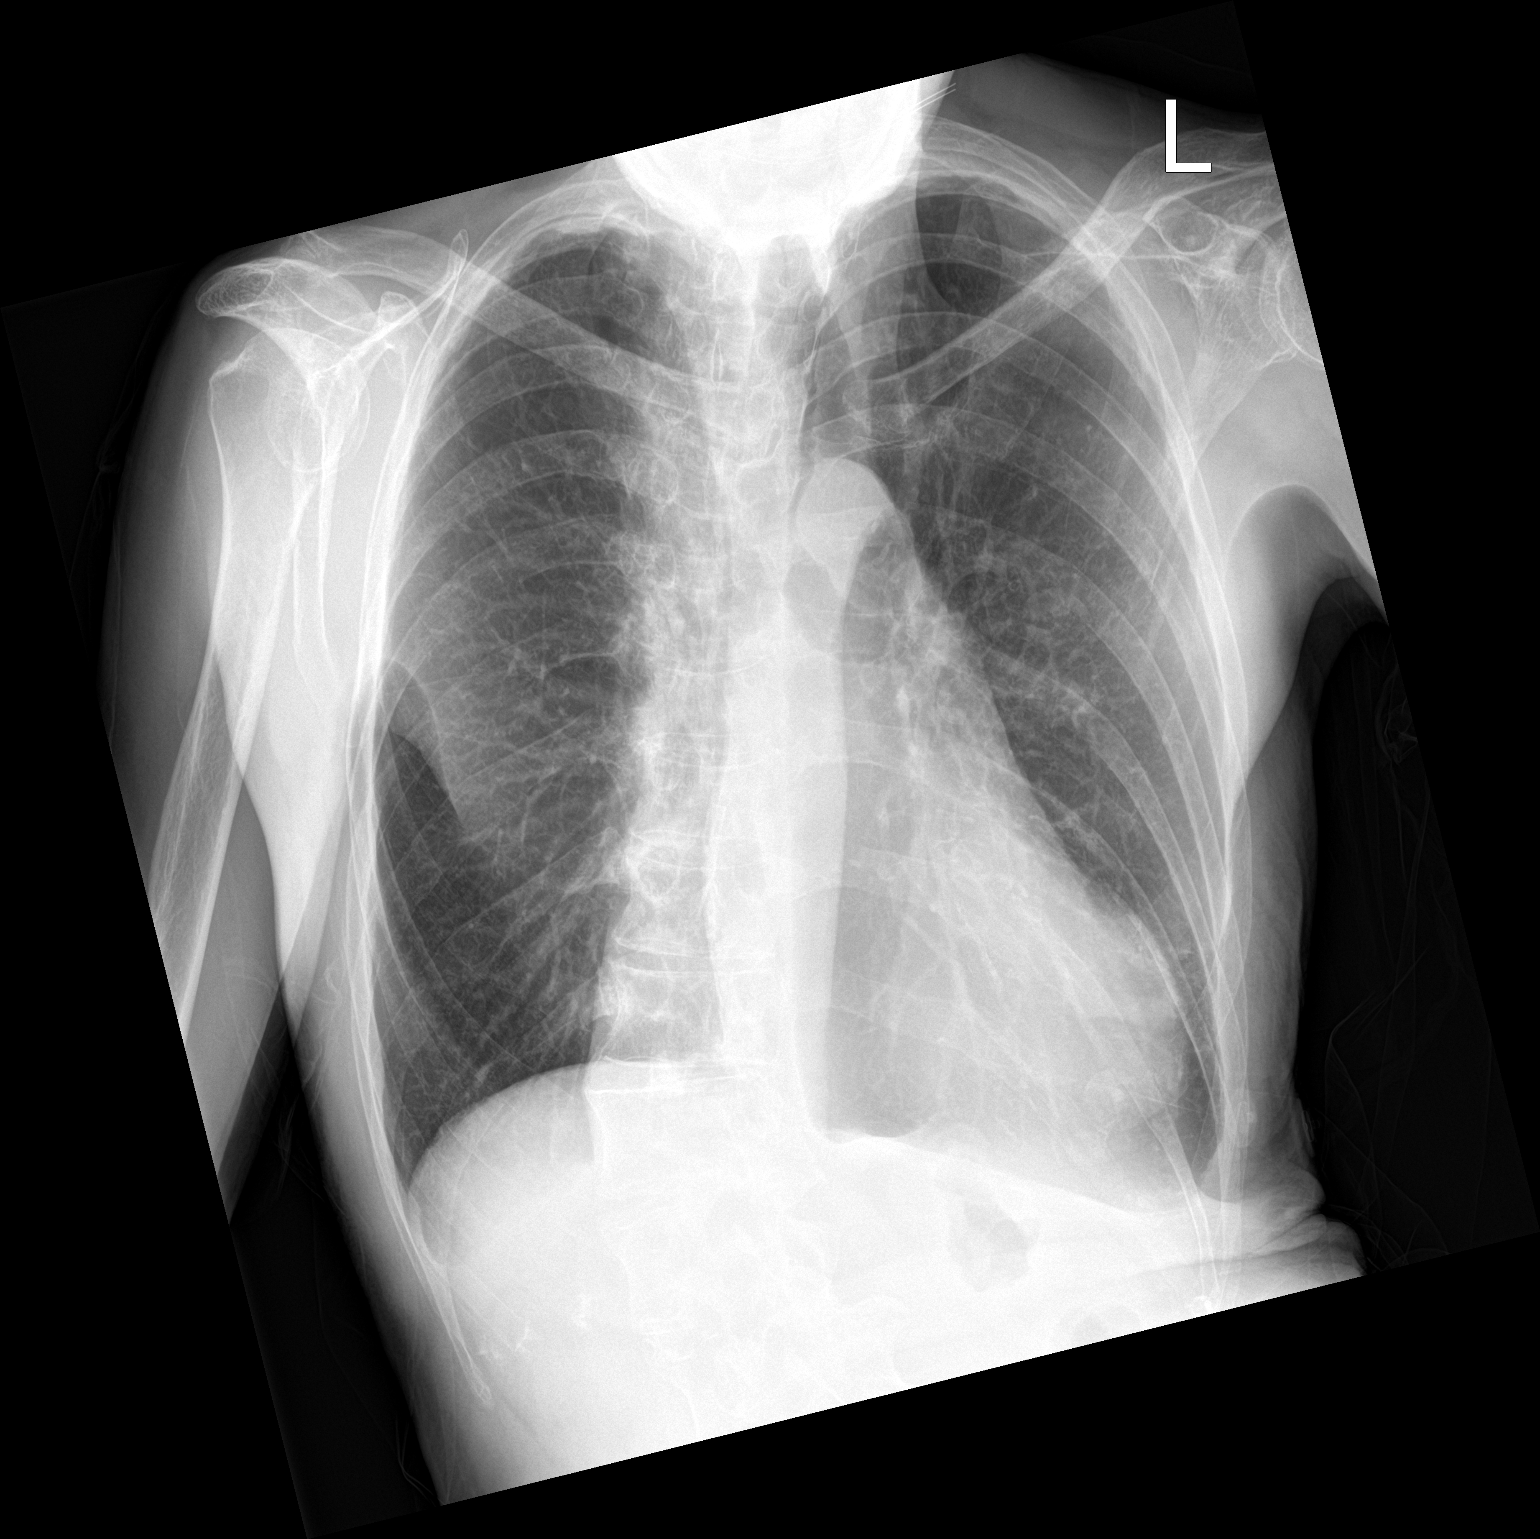

[2 of 2 positions shown; findings below may reference images not displayed]

FINDINGS: Small left pleural effusion and left lung base atelectasis.
Pneumonia is not excluded the right lung is clear. No pneumothorax.
Mild cardiomegaly. No acute osseous pathology. Degenerative changes
of the spine. Osteopenia.
IMPRESSION: Small left pleural effusion and left lung base atelectasis or
infiltrate.
